# Patient Record
Sex: Female | Born: 1965 | Race: White | Hispanic: No | Smoking: Former smoker
Health system: Southern US, Community
[De-identification: ages and names within clinical notes are randomized; demographics above are authoritative.]

## PROBLEM LIST (undated history)

## (undated) DIAGNOSIS — I10 Essential (primary) hypertension: Secondary | ICD-10-CM

## (undated) DIAGNOSIS — K5792 Diverticulitis of intestine, part unspecified, without perforation or abscess without bleeding: Secondary | ICD-10-CM

## (undated) DIAGNOSIS — F200 Paranoid schizophrenia: Secondary | ICD-10-CM

## (undated) DIAGNOSIS — I809 Phlebitis and thrombophlebitis of unspecified site: Secondary | ICD-10-CM

## (undated) HISTORY — DX: Phlebitis and thrombophlebitis of unspecified site: I80.9

## (undated) HISTORY — DX: Morbid (severe) obesity due to excess calories: E66.01

---

## 2011-10-30 ENCOUNTER — Emergency Department (HOSPITAL_COMMUNITY): Payer: 59

## 2011-10-30 ENCOUNTER — Observation Stay (HOSPITAL_COMMUNITY)
Admission: EM | Admit: 2011-10-30 | Discharge: 2011-10-31 | DRG: 603 | Disposition: A | Discharge status: Home / Self Care | Payer: 59 | Attending: Internal Medicine | Admitting: Internal Medicine

## 2011-10-30 ENCOUNTER — Encounter (HOSPITAL_COMMUNITY): Payer: Self-pay | Admitting: Emergency Medicine

## 2011-10-30 DIAGNOSIS — IMO0001 Reserved for inherently not codable concepts without codable children: Secondary | ICD-10-CM | POA: Diagnosis present

## 2011-10-30 DIAGNOSIS — Z203 Contact with and (suspected) exposure to rabies: Secondary | ICD-10-CM | POA: Diagnosis present

## 2011-10-30 DIAGNOSIS — Z6838 Body mass index (BMI) 38.0-38.9, adult: Secondary | ICD-10-CM

## 2011-10-30 DIAGNOSIS — R030 Elevated blood-pressure reading, without diagnosis of hypertension: Secondary | ICD-10-CM | POA: Diagnosis present

## 2011-10-30 DIAGNOSIS — L0201 Cutaneous abscess of face: Principal | ICD-10-CM | POA: Diagnosis present

## 2011-10-30 DIAGNOSIS — L03211 Cellulitis of face: Principal | ICD-10-CM | POA: Diagnosis present

## 2011-10-30 DIAGNOSIS — S0180XA Unspecified open wound of other part of head, initial encounter: Secondary | ICD-10-CM | POA: Diagnosis present

## 2011-10-30 DIAGNOSIS — W5501XA Bitten by cat, initial encounter: Secondary | ICD-10-CM | POA: Diagnosis present

## 2011-10-30 DIAGNOSIS — D72829 Elevated white blood cell count, unspecified: Secondary | ICD-10-CM | POA: Diagnosis present

## 2011-10-30 LAB — CBC
Hemoglobin: 13.2 g/dL (ref 12.0–15.0)
MCHC: 33.3 g/dL (ref 30.0–36.0)
RBC: 4.5 MIL/uL (ref 3.87–5.11)
WBC: 12.7 10*3/uL — ABNORMAL HIGH (ref 4.0–10.5)

## 2011-10-30 LAB — DIFFERENTIAL
Basophils Relative: 0 % (ref 0–1)
Lymphocytes Relative: 19 % (ref 12–46)
Lymphs Abs: 2.4 10*3/uL (ref 0.7–4.0)
Monocytes Relative: 7 % (ref 3–12)
Neutro Abs: 9.1 10*3/uL — ABNORMAL HIGH (ref 1.7–7.7)
Neutrophils Relative %: 72 % (ref 43–77)

## 2011-10-30 IMAGING — CT CT MAXILLOFACIAL W/ CM
1 series · 15 of 30 positions shown, 19 images · IV contrast (omnipaque)
Comparison: None.

CLINICAL DATA: Animal bite (cat) to the right face in the maxillary
region below the right eye.  Green yellow drainage from the
puncture wounds.

CT MAXILLOFACIAL WITH CONTRAST
TECHNIQUE: Multidetector CT imaging of the maxillofacial
structures was performed with intravenous contrast. Multiplanar CT
image reconstructions were also generated.
Contrast: 100mL OMNIPAQUE IOHEXOL 300 MG/ML  SOLN

[Series 3: facial st · axial · 0.32mm/px · z∈[-175,-53]mm · 15 of 67 slices shown, 19 images]
[im 3/67  brain]
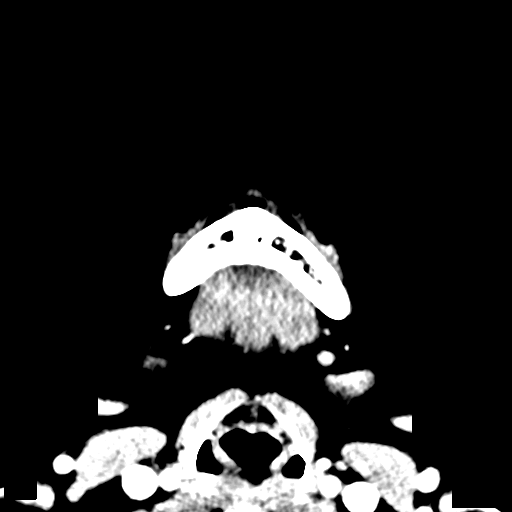
[im 3/67  bone]
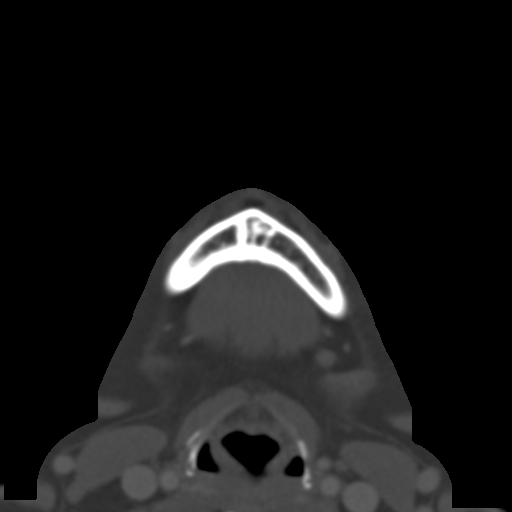
[im 7/67  bone]
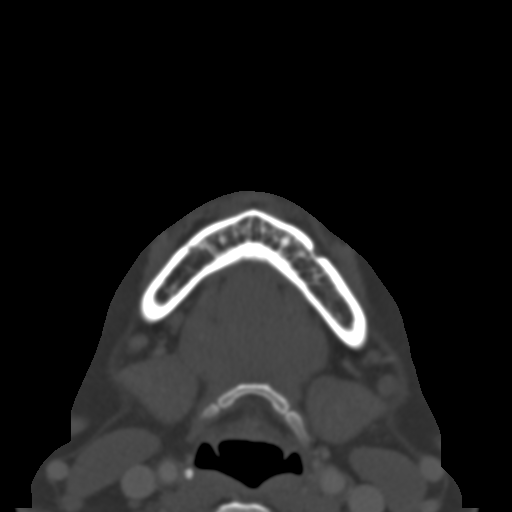
[im 12/67  bone]
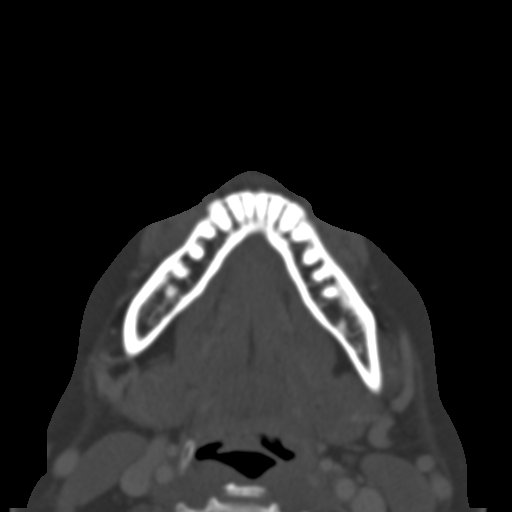
[im 16/67  bone]
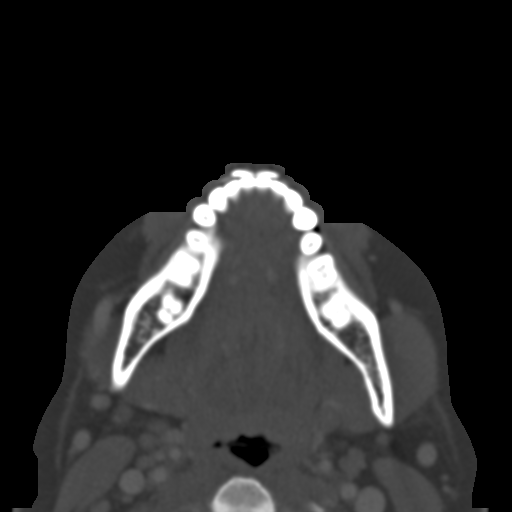
[im 21/67  brain]
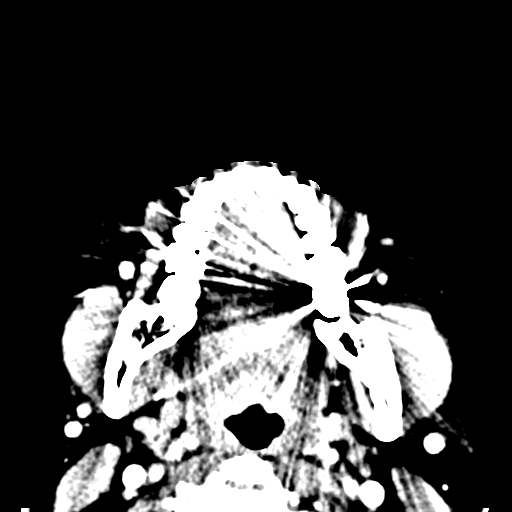
[im 21/67  bone]
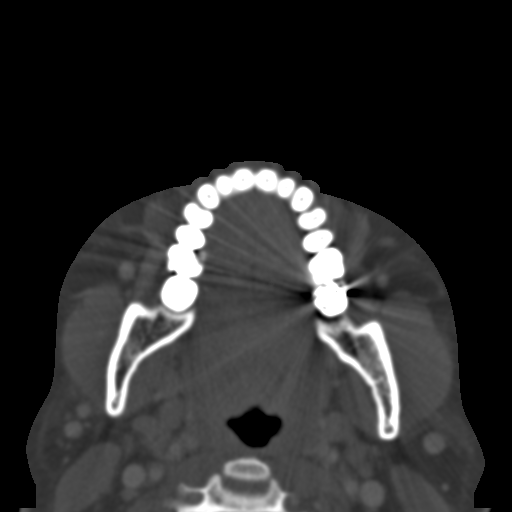
[im 26/67  bone]
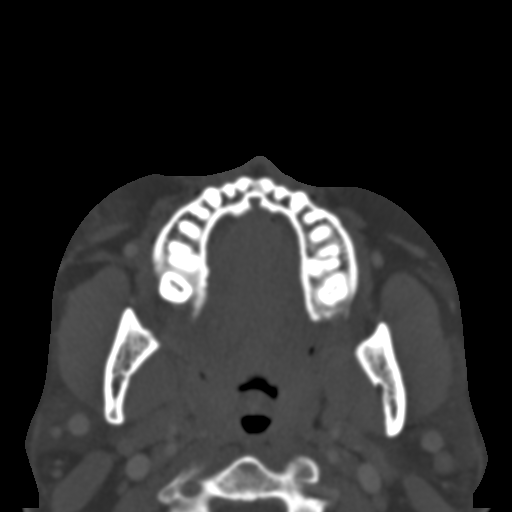
[im 30/67  bone]
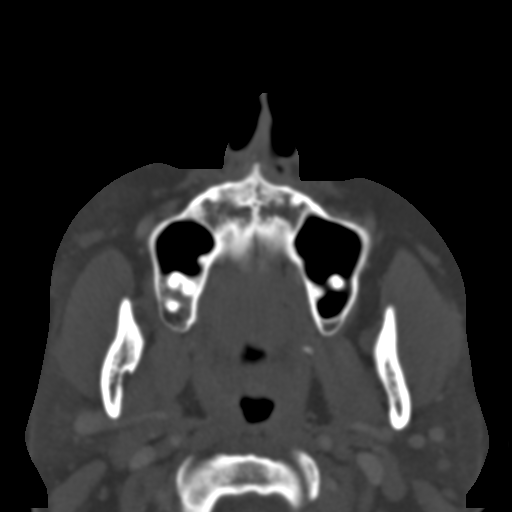
[im 35/67  bone]
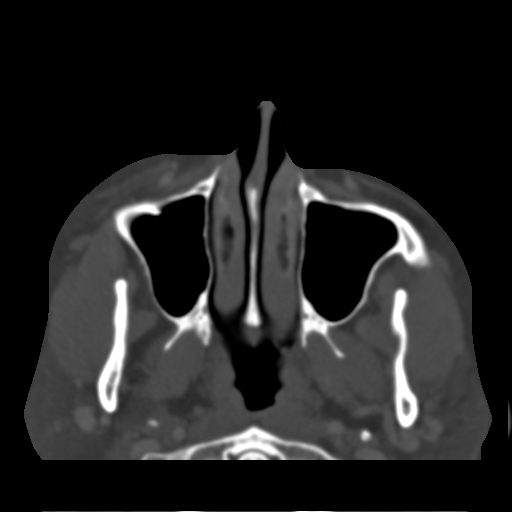
[im 37/67  brain]
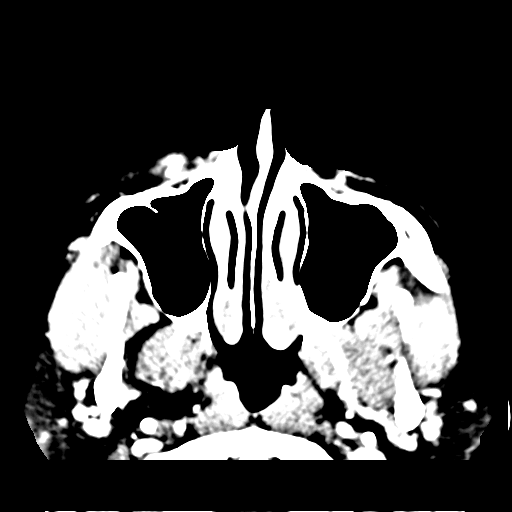
[im 37/67  bone]
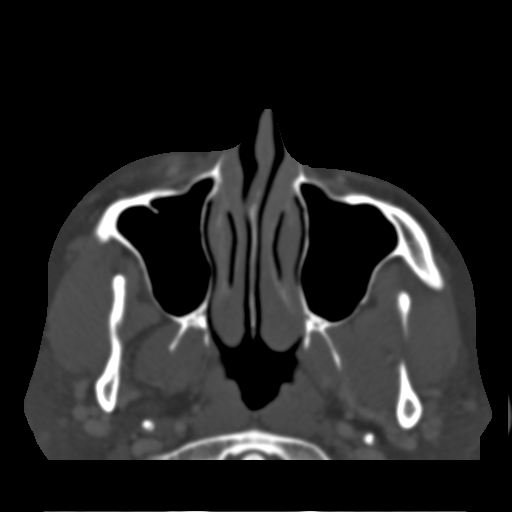
[im 41/67  bone]
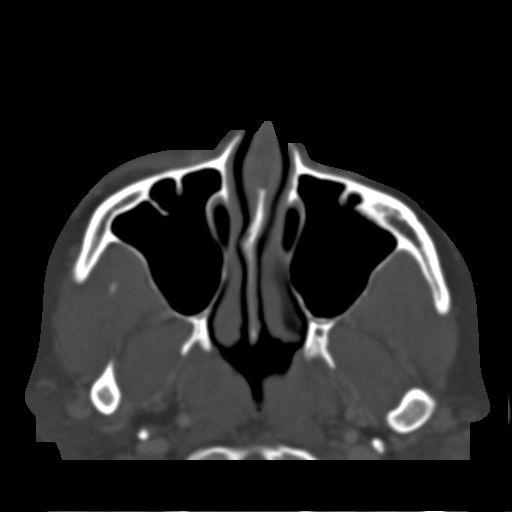
[im 46/67  bone]
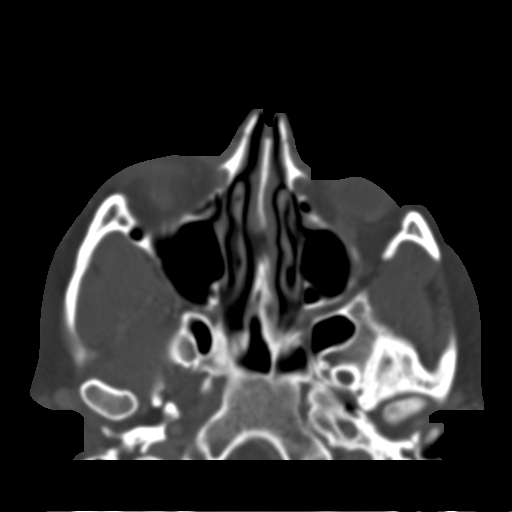
[im 51/67  bone]
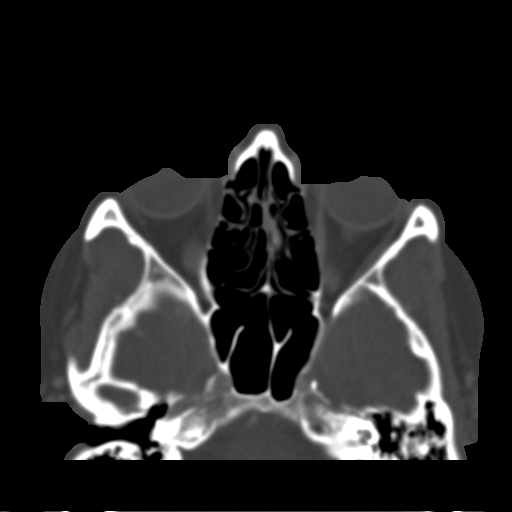
[im 55/67  brain]
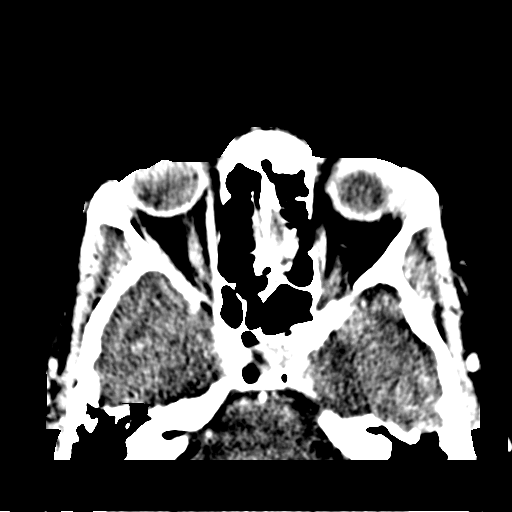
[im 55/67  bone]
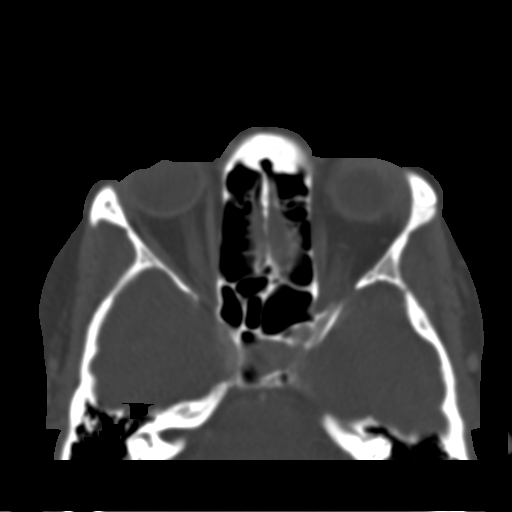
[im 60/67  bone]
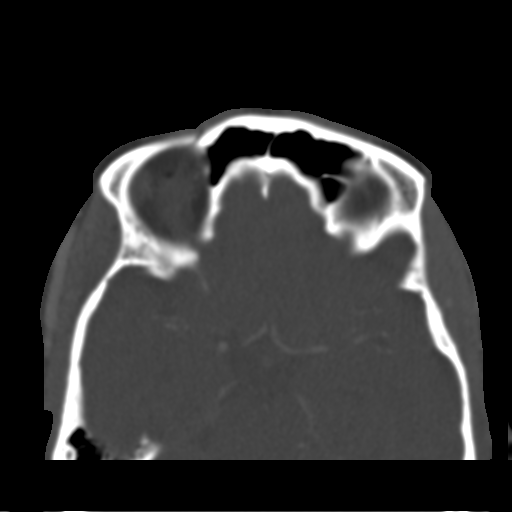
[im 64/67  bone]
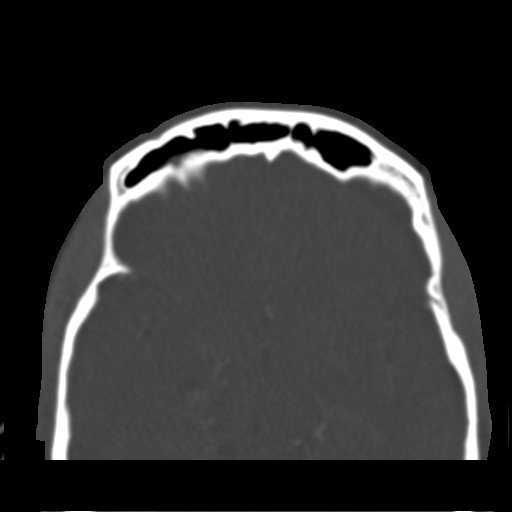

[15 of 30 positions shown; findings below may reference images not displayed]

FINDINGS: There is evidence of mild subcutaneous soft tissue
swelling and edema inferior to the right by consistent with the
area of clinical concern.  Changes are consistent with cellulitis
or edema.  No discrete abscess is identified. No abnormal focal
contrast opacification.

Small focal mucosal thickening or retention cyst in the roof of the
left maxillary antrum.  The paranasal sinuses are otherwise not
opacified.  The globes and extraocular muscles appear symmetrical
and intact.  The orbital rims, maxillary antral walls, nasal bones,
nasal septum, nasal spine, pterygoid plates, zygomatic arches,
mandibles, and temporomandibular joints appear intact.  No
displaced fractures are identified.
IMPRESSION: Mild subcutaneous soft tissue infiltration in the right anterior
maxillary region.  No discrete abscess identified.  Orbital and
facial bones appear intact.

## 2011-10-30 MED ORDER — IOHEXOL 300 MG/ML  SOLN
100.0000 mL | Freq: Once | INTRAMUSCULAR | Status: AC | PRN
  Administered 2011-10-30: 100 mL via INTRAVENOUS

## 2011-10-30 MED ORDER — SODIUM CHLORIDE 0.9 % IV SOLN
Freq: Once | INTRAVENOUS | Status: AC
  Administered 2011-10-30: 23:00:00 via INTRAVENOUS

## 2011-10-30 MED ORDER — RABIES VACCINE, PCEC IM SUSR
1.0000 mL | Freq: Once | INTRAMUSCULAR | Status: AC
  Administered 2011-10-31: 1 mL via INTRAMUSCULAR
  Filled 2011-10-30: qty 1

## 2011-10-30 MED ORDER — AMPICILLIN-SULBACTAM SODIUM 3 (2-1) G IJ SOLR
3.0000 g | Freq: Once | INTRAMUSCULAR | Status: AC
  Administered 2011-10-30: 3 g via INTRAVENOUS
  Filled 2011-10-30: qty 3

## 2011-10-30 MED ORDER — RABIES IMMUNE GLOBULIN 150 UNIT/ML IM INJ
20.0000 [IU]/kg | INJECTION | Freq: Once | INTRAMUSCULAR | Status: AC
  Administered 2011-10-31: 2100 [IU] via INTRAMUSCULAR
  Filled 2011-10-30: qty 14

## 2011-10-30 NOTE — ED Notes (Signed)
Pt has bite marks on right side of face underneath eye.  Pt states she was bitten by stray cat in her apartment complex. Right side of face is swollen with small amount of drainage coming from bite mark.

## 2011-10-30 NOTE — ED Notes (Signed)
Unable to get IV.  Notified for assist from charge RN

## 2011-10-30 NOTE — ED Notes (Signed)
MD at bedside. 

## 2011-10-30 NOTE — ED Provider Notes (Signed)
History     CSN: [id number]  Arrival date & time 10/30/11  1914   First MD Initiated Contact with Patient 10/30/11 2105      Chief Complaint  Patient presents with  . Animal Bite    (Consider location/radiation/quality/duration/timing/severity/associated sxs/prior treatment) The history is provided by the patient.   patient here with a bite to her right face 12 hours ago. Location of the bite is the right maxilla just below her eye. Denies fever but does note some green-yellow drainage from the puncture wounds. Has been expressing the pus herself. Patient does not have custody of the cat.  History reviewed. No pertinent past medical history.  Past Surgical History  Procedure Date  . Cesarean section     Family History  Problem Relation Age of Onset  . Heart failure Father     History  Substance Use Topics  . Smoking status: Never Smoker   . Smokeless tobacco: Not on file  . Alcohol Use: No    OB History    Grav Para Term Preterm Abortions TAB SAB Ect Mult Living                  Review of Systems  All other systems reviewed and are negative.    Allergies  Shellfish allergy  Home Medications   Current Outpatient Rx  Name Route Sig Dispense Refill  . OMEGA-3 FATTY ACIDS 1000 MG PO CAPS Oral Take 2 g by mouth daily.    . ADULT MULTIVITAMIN W/MINERALS CH Oral Take 1 tablet by mouth daily.      BP 175/105  Pulse 90  Temp(Src) 99.1 F (37.3 C) (Oral)  Resp 18  SpO2 96%  Physical Exam  Nursing note and vitals reviewed. Constitutional: She is oriented to person, place, and time. She appears well-developed and well-nourished.  Non-toxic appearance. No distress.  HENT:  Head: Normocephalic and atraumatic.       Laceration at infraorbital region of right face with some yellow pussy drainage. Surrounding erythema of the skin. No crepitus appreciated. Patient's extraocular muscles of her right eye are normal. Her conjunctivae are noninjected  Eyes:  Conjunctivae, EOM and lids are normal. Pupils are equal, round, and reactive to light.  Neck: Normal range of motion. Neck supple. No tracheal deviation present. No mass present.  Cardiovascular: Normal rate, regular rhythm and normal heart sounds.  Exam reveals no gallop.   No murmur heard. Pulmonary/Chest: Effort normal and breath sounds normal. No stridor. No respiratory distress. She has no decreased breath sounds. She has no wheezes. She has no rhonchi. She has no rales.  Abdominal: Soft. Normal appearance and bowel sounds are normal. She exhibits no distension. There is no tenderness. There is no rebound and no CVA tenderness.  Musculoskeletal: Normal range of motion. She exhibits no edema and no tenderness.  Neurological: She is alert and oriented to person, place, and time. She has normal strength. No cranial nerve deficit or sensory deficit. GCS eye subscore is 4. GCS verbal subscore is 5. GCS motor subscore is 6.  Skin: Skin is warm and dry. No abrasion and no rash noted.  Psychiatric: She has a normal mood and affect. Her speech is normal and behavior is normal.    ED Course  Procedures (including critical care time)  Labs Reviewed - No data to display No results found.   No diagnosis found.    MDM  Patient given rabies vaccine as well as started on Unasyn. Tetanus status was updated.  She'll be admitted to the internal medicine service.        Toy Baker, MD 10/31/11 0000

## 2011-10-31 ENCOUNTER — Encounter (HOSPITAL_COMMUNITY): Payer: Self-pay | Admitting: Internal Medicine

## 2011-10-31 DIAGNOSIS — R030 Elevated blood-pressure reading, without diagnosis of hypertension: Secondary | ICD-10-CM | POA: Diagnosis present

## 2011-10-31 DIAGNOSIS — S0185XA Open bite of other part of head, initial encounter: Secondary | ICD-10-CM | POA: Diagnosis present

## 2011-10-31 DIAGNOSIS — L0201 Cutaneous abscess of face: Secondary | ICD-10-CM

## 2011-10-31 DIAGNOSIS — L03211 Cellulitis of face: Secondary | ICD-10-CM | POA: Diagnosis present

## 2011-10-31 DIAGNOSIS — IMO0001 Reserved for inherently not codable concepts without codable children: Secondary | ICD-10-CM

## 2011-10-31 DIAGNOSIS — D72829 Elevated white blood cell count, unspecified: Secondary | ICD-10-CM | POA: Diagnosis present

## 2011-10-31 LAB — CBC
MCH: 29.1 pg (ref 26.0–34.0)
MCHC: 32.9 g/dL (ref 30.0–36.0)
Platelets: 290 10*3/uL (ref 150–400)
RBC: 4.05 MIL/uL (ref 3.87–5.11)

## 2011-10-31 LAB — BASIC METABOLIC PANEL
Calcium: 8.7 mg/dL (ref 8.4–10.5)
GFR calc non Af Amer: >90 mL/min (ref >90)
Glucose, Bld: 94 mg/dL (ref 70–99)
Sodium: 138 mEq/L (ref 135–145)

## 2011-10-31 MED ORDER — SODIUM CHLORIDE 0.9 % IV SOLN
3.0000 g | Freq: Four times a day (QID) | INTRAVENOUS | Status: DC
  Administered 2011-10-31: 3 g via INTRAVENOUS
  Filled 2011-10-31 (×2): qty 3

## 2011-10-31 MED ORDER — HYDRALAZINE HCL 20 MG/ML IJ SOLN
10.0000 mg | Freq: Four times a day (QID) | INTRAMUSCULAR | Status: DC | PRN
  Filled 2011-10-31: qty 0.5

## 2011-10-31 MED ORDER — DIPHENHYDRAMINE HCL 50 MG/ML IJ SOLN: 25.0000 mg | Freq: Four times a day (QID) | INTRAMUSCULAR | Status: DC | PRN

## 2011-10-31 MED ORDER — SODIUM CHLORIDE 0.9 % IV SOLN: 250.0000 mL | INTRAVENOUS | Status: DC | PRN

## 2011-10-31 MED ORDER — IBUPROFEN 800 MG PO TABS
800.0000 mg | ORAL_TABLET | Freq: Four times a day (QID) | ORAL | Status: DC | PRN
  Administered 2011-10-31 (×2): 800 mg via ORAL
  Filled 2011-10-31 (×2): qty 1

## 2011-10-31 MED ORDER — AMOXICILLIN-POT CLAVULANATE 875-125 MG PO TABS: 1.0000 | ORAL_TABLET | Freq: Two times a day (BID) | ORAL | Status: AC

## 2011-10-31 MED ORDER — ACETAMINOPHEN 325 MG PO TABS: 650.0000 mg | ORAL_TABLET | Freq: Four times a day (QID) | ORAL | Status: DC | PRN

## 2011-10-31 MED ORDER — TETANUS-DIPHTH-ACELL PERTUSSIS 5-2.5-18.5 LF-MCG/0.5 IM SUSP
0.5000 mL | Freq: Once | INTRAMUSCULAR | Status: AC
  Administered 2011-10-31: 0.5 mL via INTRAMUSCULAR
  Filled 2011-10-31: qty 0.5

## 2011-10-31 MED ORDER — SODIUM CHLORIDE 0.9 % IV SOLN
3.0000 g | Freq: Four times a day (QID) | INTRAVENOUS | Status: AC
  Administered 2011-10-31: 3 g via INTRAVENOUS
  Filled 2011-10-31: qty 3

## 2011-10-31 MED ORDER — SODIUM CHLORIDE 0.9 % IV SOLN: 3.0000 g | Freq: Four times a day (QID) | INTRAVENOUS | Status: DC

## 2011-10-31 MED ORDER — ACETAMINOPHEN 650 MG RE SUPP: 650.0000 mg | Freq: Four times a day (QID) | RECTAL | Status: DC | PRN

## 2011-10-31 MED ORDER — OXYCODONE HCL 5 MG PO TABS: 5.0000 mg | ORAL_TABLET | ORAL | Status: DC | PRN

## 2011-10-31 MED ORDER — SODIUM CHLORIDE 0.9 % IJ SOLN: 3.0000 mL | INTRAMUSCULAR | Status: DC | PRN

## 2011-10-31 MED ORDER — ENOXAPARIN SODIUM 40 MG/0.4ML ~~LOC~~ SOLN
40.0000 mg | SUBCUTANEOUS | Status: DC
  Filled 2011-10-31: qty 0.4

## 2011-10-31 MED ORDER — ONDANSETRON HCL 4 MG PO TABS: 4.0000 mg | ORAL_TABLET | Freq: Four times a day (QID) | ORAL | Status: DC | PRN

## 2011-10-31 MED ORDER — HYDROMORPHONE HCL PF 1 MG/ML IJ SOLN: 0.5000 mg | INTRAMUSCULAR | Status: DC | PRN

## 2011-10-31 MED ORDER — ALUM & MAG HYDROXIDE-SIMETH 200-200-20 MG/5ML PO SUSP: 30.0000 mL | Freq: Four times a day (QID) | ORAL | Status: DC | PRN

## 2011-10-31 MED ORDER — SODIUM CHLORIDE 0.9 % IJ SOLN
3.0000 mL | Freq: Two times a day (BID) | INTRAMUSCULAR | Status: DC
  Administered 2011-10-31: 3 mL via INTRAVENOUS

## 2011-10-31 MED ORDER — AMOXICILLIN-POT CLAVULANATE 875-125 MG PO TABS
1.0000 | ORAL_TABLET | Freq: Two times a day (BID) | ORAL | Status: DC
  Filled 2011-10-31 (×2): qty 1

## 2011-10-31 MED ORDER — ZOLPIDEM TARTRATE 5 MG PO TABS: 5.0000 mg | ORAL_TABLET | Freq: Every evening | ORAL | Status: DC | PRN

## 2011-10-31 MED ORDER — ONDANSETRON HCL 4 MG/2ML IJ SOLN: 4.0000 mg | Freq: Four times a day (QID) | INTRAMUSCULAR | Status: DC | PRN

## 2011-10-31 MED ORDER — OXYCODONE-ACETAMINOPHEN 5-325 MG PO TABS: 0.5000 | ORAL_TABLET | Freq: Four times a day (QID) | ORAL | Status: DC | PRN

## 2011-10-31 NOTE — ED Notes (Signed)
MD [name] at bedside. EDP explaining to pt that at this time it is noted that bite site and redness is progressing, even with does of anbx given, and that pt should stay at least one day in the hospital. Pt is concern since she does not have any arrangement for her son at this time, [facility] contacted and approved for pt's son to stay one night. Pt verbalizes understanding.

## 2011-10-31 NOTE — CV Procedure (Signed)
DC to home. No change from am assessment. Ambulated from floor to car per pt request.Britt Theard, Ricci Barker

## 2011-10-31 NOTE — ED Notes (Signed)
Attempted to call report - RN to return call.  

## 2011-10-31 NOTE — Discharge Summary (Signed)
Discharge Summary  Margaret Christian MR#: [id number]  DOB:[birth year]  Date of Admission: 10/30/2011 Date of Discharge: 10/31/2011  Patient's PCP: No primary provider on file.  Attending Physician:Kentrail Shew A  Consults: Dr. Ninetta Lights, ID by telephone.  Discharge Diagnoses: Principal Problem:  *Cellulitis and abscess of face Active Problems:  Cat bite of face  Elevated blood pressure reading without diagnosis of hypertension  Morbid obesity  Brief Admitting History and Physical Margaret Christian is an 46 y.o. female who presents to the ED on 10/29/2101 with complaints of worsening redness and swelling in face after being bitten by a ferral cat.  Discharge Medications Medication List  As of 10/31/2011 11:18 AM   TAKE these medications         amoxicillin-clavulanate 875-125 MG per tablet   Commonly known as: AUGMENTIN   Take 1 tablet by mouth every 12 (twelve) hours.      fish oil-omega-3 fatty acids 1000 MG capsule   Take 2 g by mouth daily.      mulitivitamin with minerals Tabs   Take 1 tablet by mouth daily.      oxyCODONE-acetaminophen 5-325 MG per tablet   Commonly known as: PERCOCET   Take 0.5-1 tablets by mouth every 6 (six) hours as needed for pain.            Hospital Course: Cellulitis and abscess of face from cat bite  CT of the maxillofacial done with results as indicated below.  Initially started on Unasyn, which was transitioned to Augmentin at discharge. Define a total of 12 day course of antibiotics. Patient received TDaP, rabies vaccine, and rabies immune globulin on 10/30/2011. Discussed with Dr. Ninetta Lights, infectious diseases, who agreed with transition the patient to Augmentin. Dr. Ninetta Lights noted that the patient needs to come back to the emergency department or public health at Day 3, 7, 14, 28 to complete her rabies vaccine. The corresponding days would be June 4th, 8th, 15th, 29th. Patient was notified of the above instruction, she expressed understanding and  compliance. Patient was also instructed that if her cellulitis has worsened she is to come back to the emergency department for further evaluation and for re-initiation of the IV antibiotics. Patient also reported that she will attempt to quarantine the cat if the cat comes back again.   Elevated blood pressure reading without diagnosis of hypertension  Blood pressure improved. Likely due to anxiety in the emergency department.   Morbid obesity  Diet and exercise as outpatient.  Leukocytosis Be due to cellulitis.  Improved.  Day of Discharge BP 122/81  Pulse 72  Temp(Src) 98.7 F (37.1 C) (Oral)  Resp 18  Ht 5\' 4"  (1.626 m)  Wt 102.286 kg (225 lb 8 oz)  BMI 38.71 kg/m2  SpO2 100%  LMP 10/28/2011  Results for orders placed during the hospital encounter of 10/30/11 (from the past 48 hour(s))  CBC     Status: Abnormal   Collection Time   10/30/11  9:39 PM      Component Value Range Comment   WBC 12.7 (*) 4.0 - 10.5 (K/uL)    RBC 4.50  3.87 - 5.11 (MIL/uL)    Hemoglobin 13.2  12.0 - 15.0 (g/dL)    HCT 40.9  81.1 - 91.4 (%)    MCV 88.0  78.0 - 100.0 (fL)    MCH 29.3  26.0 - 34.0 (pg)    MCHC 33.3  30.0 - 36.0 (g/dL)    RDW 78.2  95.6 - 21.3 (%)  Platelets 362  150 - 400 (K/uL)   DIFFERENTIAL     Status: Abnormal   Collection Time   10/30/11  9:39 PM      Component Value Range Comment   Neutrophils Relative 72  43 - 77 (%)    Neutro Abs 9.1 (*) 1.7 - 7.7 (K/uL)    Lymphocytes Relative 19  12 - 46 (%)    Lymphs Abs 2.4  0.7 - 4.0 (K/uL)    Monocytes Relative 7  3 - 12 (%)    Monocytes Absolute 0.8  0.1 - 1.0 (K/uL)    Eosinophils Relative 2  0 - 5 (%)    Eosinophils Absolute 0.2  0.0 - 0.7 (K/uL)    Basophils Relative 0  0 - 1 (%)    Basophils Absolute 0.1  0.0 - 0.1 (K/uL)   BASIC METABOLIC PANEL     Status: Normal   Collection Time   10/31/11  5:27 AM      Component Value Range Comment   Sodium 138  135 - 145 (mEq/L)    Potassium 3.5  3.5 - 5.1 (mEq/L)    Chloride 105   96 - 112 (mEq/L)    CO2 23  19 - 32 (mEq/L)    Glucose, Bld 94  70 - 99 (mg/dL)    BUN 8  6 - 23 (mg/dL)    Creatinine, Ser 4.78  0.50 - 1.10 (mg/dL)    Calcium 8.7  8.4 - 10.5 (mg/dL)    GFR calc non Af Amer >90  >90 (mL/min)    GFR calc Af Amer >90  >90 (mL/min)   CBC     Status: Abnormal   Collection Time   10/31/11  5:27 AM      Component Value Range Comment   WBC 10.8 (*) 4.0 - 10.5 (K/uL)    RBC 4.05  3.87 - 5.11 (MIL/uL)    Hemoglobin 11.8 (*) 12.0 - 15.0 (g/dL)    HCT 29.5 (*) 62.1 - 46.0 (%)    MCV 88.6  78.0 - 100.0 (fL)    MCH 29.1  26.0 - 34.0 (pg)    MCHC 32.9  30.0 - 36.0 (g/dL)    RDW 30.8  65.7 - 84.6 (%)    Platelets 290  150 - 400 (K/uL)     Ct Maxillofacial W/cm  10/30/2011  *RADIOLOGY REPORT*  Clinical Data: Animal bite (cat) to the right face in the maxillary region below the right eye.  Green yellow drainage from the puncture wounds.  CT MAXILLOFACIAL WITH CONTRAST  Technique:  Multidetector CT imaging of the maxillofacial structures was performed with intravenous contrast. Multiplanar CT image reconstructions were also generated.  Contrast: OMNIPAQUE IOHEXOL 300 MG/ML  SOLN  Comparison: None.  Findings: There is evidence of mild subcutaneous soft tissue swelling and edema inferior to the right by consistent with the area of clinical concern.  Changes are consistent with cellulitis or edema.  No discrete abscess is identified. No abnormal focal contrast opacification.  Small focal mucosal thickening or retention cyst in the roof of the left maxillary antrum.  The paranasal sinuses are otherwise not opacified.  The globes and extraocular muscles appear symmetrical and intact.  The orbital rims, maxillary antral walls, nasal bones, nasal septum, nasal spine, pterygoid plates, zygomatic arches, mandibles, and temporomandibular joints appear intact.  No displaced fractures are identified.  IMPRESSION: Mild subcutaneous soft tissue infiltration in the right anterior  maxillary region.  No discrete abscess identified.  Orbital and facial bones appear intact.  Original Report Authenticated By: Marlon Pel, M.D.   Disposition: Home, she was instructed to come back to the emergency department for her rabies vaccine at specific dates.  Diet: Heart healthy diet  Activity: Resume as tolerated   Follow-up Appts: Discharge Orders    Future Orders Please Complete By Expires   Diet - low sodium heart healthy      Increase activity slowly      Discharge instructions      Comments:   Please come back to emergency department or public health on Day 3 (June 4), 7 (June 8), 14 (June 15), and 28 (June 29) to complete your rabies vaccine.      TESTS THAT NEED FOLLOW-UP None  Time spent on discharge, talking to the patient, and coordinating care: 25 mins.   Signed: Cristal Ford, MD 10/31/2011, 11:18 AM

## 2011-10-31 NOTE — H&P (Addendum)
DATE OF ADMISSION:  10/31/2011  PCP:  None   Chief Complaint:  Bitten by Cat in Face   HPI: Margaret Christian is an 46 y.o. female who presents to the ED with complaints of worsening redness and swelling in face after being bitten by a ferral cat.  The cat bite occurred at 9 AM.  When she presented in the evening to the ED she was evaluated and administered rabies vaccine and tetanus shots (dtap), .and also given IV Unasyn then referred for admission.    History reviewed. No pertinent past medical history.  Past Surgical History  Procedure Date  . Cesarean section     Medications:  HOME MEDS: Prior to Admission medications   Medication Sig Start Date End Date Taking? Authorizing Provider  fish oil-omega-3 fatty acids 1000 MG capsule Take 2 g by mouth daily.   Yes Historical Provider, MD  Multiple Vitamin (MULITIVITAMIN WITH MINERALS) TABS Take 1 tablet by mouth daily.   Yes Historical Provider, MD    Allergies:  Allergies  Allergen Reactions  . Shellfish Allergy Hives    Social History:   reports that she has never smoked. She does not have any smokeless tobacco history on file. She reports that she does not drink alcohol. Her drug history not on file.  Family History: Family History  Problem Relation Age of Onset  . Heart failure Father   . Hypertension Father   . Hypertension Brother   . Diabetes type II Paternal Grandfather     Review of Systems:  The patient denies anorexia, fever, weight loss, vision loss, decreased hearing, hoarseness, chest pain, syncope, dyspnea on exertion, peripheral edema, balance deficits, hemoptysis, abdominal pain, melena, hematochezia, severe indigestion/heartburn, hematuria, incontinence, genital sores, muscle weakness, suspicious skin lesions, transient blindness, difficulty walking, depression, unusual weight change, abnormal bleeding, enlarged lymph nodes, angioedema, and breast masses.   Physical Exam:  GEN:  Pleasant 46 year old  Caucasian female examined  and in no acute distress; cooperative with exam Filed Vitals:   10/30/11 1919 10/30/11 2357  BP: 175/105   Pulse: 90   Temp: 99.1 F (37.3 C)   TempSrc: Oral   Resp: 18   Weight:  104.327 kg (230 lb)  SpO2: 96%    Blood pressure 175/105, pulse 90, temperature 99.1 F (37.3 C), temperature source Oral, resp. rate 18, weight 104.327 kg (230 lb), last menstrual period 10/28/2011, SpO2 96.00%. PSYCH: She is alert and oriented x4; does not appear anxious does not appear depressed; affect is normal HEENT: Normocephalic and + Right maxillary area with +Erythema and Edema and Rubor, Mucous membranes pink; PERRLA; EOM intact; Fundi:  Benign;  No scleral icterus, Nares: Patent, Oropharynx: Clear, Fair Dentition, Neck:  FROM, no cervical lymphadenopathy nor thyromegaly or carotid bruit; no JVD; Breasts:: Not examined CHEST WALL: No tenderness CHEST: Normal respiration, clear to auscultation bilaterally HEART: Regular rate and rhythm; no murmurs rubs or gallops BACK: No kyphosis or scoliosis; no CVA tenderness ABDOMEN: Positive Bowel Sounds, Obese, soft non-tender; no masses, no organomegaly, no pannus; no intertriginous candida. Rectal Exam: Not done EXTREMITIES: No bone or joint deformity; age-appropriate arthropathy of the hands and knees; no cyanosis, clubbing or edema; no ulcerations. Genitalia: not examined PULSES: 2+ and symmetric SKIN: Normal hydration no rash or ulceration CNS: Cranial nerves 2-12 grossly intact no focal neurologic deficit   Labs & Imaging Results for orders placed during the hospital encounter of 10/30/11 (from the past 48 hour(s))  CBC     Status:  Abnormal   Collection Time   10/30/11  9:39 PM      Component Value Range Comment   WBC 12.7 (*) 4.0 - 10.5 (K/uL)    RBC 4.50  3.87 - 5.11 (MIL/uL)    Hemoglobin 13.2  12.0 - 15.0 (g/dL)    HCT 16.1  09.6 - 04.5 (%)    MCV 88.0  78.0 - 100.0 (fL)    MCH 29.3  26.0 - 34.0 (pg)    MCHC 33.3   30.0 - 36.0 (g/dL)    RDW 40.9  81.1 - 91.4 (%)    Platelets 362  150 - 400 (K/uL)   DIFFERENTIAL     Status: Abnormal   Collection Time   10/30/11  9:39 PM      Component Value Range Comment   Neutrophils Relative 72  43 - 77 (%)    Neutro Abs 9.1 (*) 1.7 - 7.7 (K/uL)    Lymphocytes Relative 19  12 - 46 (%)    Lymphs Abs 2.4  0.7 - 4.0 (K/uL)    Monocytes Relative 7  3 - 12 (%)    Monocytes Absolute 0.8  0.1 - 1.0 (K/uL)    Eosinophils Relative 2  0 - 5 (%)    Eosinophils Absolute 0.2  0.0 - 0.7 (K/uL)    Basophils Relative 0  0 - 1 (%)    Basophils Absolute 0.1  0.0 - 0.1 (K/uL)    Ct Maxillofacial W/cm  10/30/2011  *RADIOLOGY REPORT*  Clinical Data: Animal bite (cat) to the right face in the maxillary region below the right eye.  Green yellow drainage from the puncture wounds.  CT MAXILLOFACIAL WITH CONTRAST  Technique:  Multidetector CT imaging of the maxillofacial structures was performed with intravenous contrast. Multiplanar CT image reconstructions were also generated.  Contrast: OMNIPAQUE IOHEXOL 300 MG/ML  SOLN  Comparison: None.  Findings: There is evidence of mild subcutaneous soft tissue swelling and edema inferior to the right by consistent with the area of clinical concern.  Changes are consistent with cellulitis or edema.  No discrete abscess is identified. No abnormal focal contrast opacification.  Small focal mucosal thickening or retention cyst in the roof of the left maxillary antrum.  The paranasal sinuses are otherwise not opacified.  The globes and extraocular muscles appear symmetrical and intact.  The orbital rims, maxillary antral walls, nasal bones, nasal septum, nasal spine, pterygoid plates, zygomatic arches, mandibles, and temporomandibular joints appear intact.  No displaced fractures are identified.  IMPRESSION: Mild subcutaneous soft tissue infiltration in the right anterior maxillary region.  No discrete abscess identified.  Orbital and facial bones appear  intact.  Original Report Authenticated By: Marlon Pel, M.D.      Assessment: Present on Admission:  .Cellulitis and abscess of face .Cat bite of face .Elevated blood pressure reading without diagnosis of hypertension .Morbid obesity   Plan:    Admit for 23 hour Observation IV Unasyn to cover Pasteurella  Pain Control Benadryl PRN IVFs IV Hydralazine PRN Elevated Blood pressures DVT Prophylaxis.   Other plans as per orders.    CODE STATUS:      FULL CODE         Demosthenes Virnig C 10/31/2011, 12:58 AM

## 2011-10-31 NOTE — Progress Notes (Signed)
Subjective: Feeling better.  Still having swelling below her right eye  Objective: Vital signs in last 24 hours: Filed Vitals:   10/31/11 0124 10/31/11 0401 10/31/11 0538 10/31/11 0601  BP: 158/86 142/87  122/81  Pulse: 91 79  72  Temp:  98.4 F (36.9 C)  98.7 F (37.1 C)  TempSrc:  Oral  Oral  Resp: 20 20  18   Height:   5\' 4"  (1.626 m)   Weight:  102.286 kg (225 lb 8 oz)    SpO2: 99% 98%  100%   Weight change:  No intake or output data in the 24 hours ending 10/31/11 1106  Physical Exam: General: Awake, Oriented, No acute distress. HEENT: EOMI, swelling below her right eye. Neck: Supple CV: S1 and S2 Lungs: Clear to ascultation bilaterally Abdomen: Soft, Nontender, Nondistended, +bowel sounds. Ext: Good pulses. Trace edema.  Lab Results: Basic Metabolic Panel:  Lab 10/31/11 0454  NA 138  K 3.5  CL 105  CO2 23  GLUCOSE 94  BUN 8  CREATININE 0.72  CALCIUM 8.7  MG --  PHOS --   Liver Function Tests: No results found for this basename: AST:5,ALT:5,ALKPHOS:5,BILITOT:5,PROT:5,ALBUMIN:5 in the last 168 hours No results found for this basename: LIPASE:5,AMYLASE:5 in the last 168 hours No results found for this basename: AMMONIA:5 in the last 168 hours CBC:  Lab 10/31/11 0527 10/30/11 2139  WBC 10.8* 12.7*  NEUTROABS -- 9.1*  HGB 11.8* 13.2  HCT 35.9* 39.6  MCV 88.6 88.0  PLT 290 362   Cardiac Enzymes: No results found for this basename: CKTOTAL:5,CKMB:5,CKMBINDEX:5,TROPONINI:5 in the last 168 hours BNP (last 3 results) No results found for this basename: PROBNP:3 in the last 8760 hours CBG: No results found for this basename: GLUCAP:5 in the last 168 hours No results found for this basename: HGBA1C:5 in the last 72 hours Other Labs: No components found with this basename: POCBNP:3 No results found for this basename: DDIMER:2 in the last 168 hours No results found for this basename: CHOL:2,HDL:2,LDLCALC:2,TRIG:2,CHOLHDL:2,LDLDIRECT:2 in the last 168  hours No results found for this basename: TSH,T4TOTAL,FREET3,T3FREE,FREET4,THYROIDAB in the last 168 hours No results found for this basename: VITAMINB12:2,FOLATE:2,FERRITIN:2,TIBC:2,IRON:2,RETICCTPCT:2 in the last 168 hours  Micro Results: No results found for this or any previous visit (from the past 240 hour(s)).  Studies/Results: Ct Maxillofacial W/cm  10/30/2011  *RADIOLOGY REPORT*  Clinical Data: Animal bite (cat) to the right face in the maxillary region below the right eye.  Green yellow drainage from the puncture wounds.  CT MAXILLOFACIAL WITH CONTRAST  Technique:  Multidetector CT imaging of the maxillofacial structures was performed with intravenous contrast. Multiplanar CT image reconstructions were also generated.  Contrast: OMNIPAQUE IOHEXOL 300 MG/ML  SOLN  Comparison: None.  Findings: There is evidence of mild subcutaneous soft tissue swelling and edema inferior to the right by consistent with the area of clinical concern.  Changes are consistent with cellulitis or edema.  No discrete abscess is identified. No abnormal focal contrast opacification.  Small focal mucosal thickening or retention cyst in the roof of the left maxillary antrum.  The paranasal sinuses are otherwise not opacified.  The globes and extraocular muscles appear symmetrical and intact.  The orbital rims, maxillary antral walls, nasal bones, nasal septum, nasal spine, pterygoid plates, zygomatic arches, mandibles, and temporomandibular joints appear intact.  No displaced fractures are identified.  IMPRESSION: Mild subcutaneous soft tissue infiltration in the right anterior maxillary region.  No discrete abscess identified.  Orbital and facial bones appear intact.  Original Report Authenticated By: Marlon Pel, M.D.    Medications: I have reviewed the patient's current medications. Scheduled Meds:   . sodium chloride   Intravenous Once  . amoxicillin-clavulanate  1 tablet Oral Q12H  .  ampicillin-sulbactam (UNASYN) IV  3 g Intravenous Once  . ampicillin-sulbactam (UNASYN) IV  3 g Intravenous Q6H  . enoxaparin  40 mg Subcutaneous Q24H  . rabies immune globulin  20 Units/kg Intramuscular Once  . rabies vaccine, PCEC  1 mL Intramuscular Once  . sodium chloride  3 mL Intravenous Q12H  . TDaP  0.5 mL Intramuscular Once   Continuous Infusions:  PRN Meds:.sodium chloride, acetaminophen, acetaminophen, alum & mag hydroxide-simeth, diphenhydrAMINE, hydrALAZINE, HYDROmorphone (DILAUDID) injection, ibuprofen, iohexol, ondansetron (ZOFRAN) IV, ondansetron, oxyCODONE, sodium chloride, zolpidem  Assessment/Plan: Cellulitis and abscess of face from cat bite Transition Unasyn to Augmentin.  Define a total of 12 day course of antibiotics.  Patient received TDaP, rabies vaccine, and rabies immune globulin on 10/30/2011.  Discussed with Dr. Ninetta Lights, infectious diseases, who agreed with transition the patient to Augmentin.  Dr. Ninetta Lights noted that the patient needs to come back to the emergency department or public health at Day 3, 7, 14, 28 to complete her rabies vaccine.  The corresponding days would be June 4th, 8th, 15th, 29th.  Patient was notified of the above instruction, she expressed understanding and compliance.  Patient was also instructed that if her cellulitis has worsened she is to come back to the emergency department for further evaluation and for re-initiation of the IV antibiotics.  Patient also reported that she will attempt to quarantine the cat if the cat comes back again.  Elevated blood pressure reading without diagnosis of hypertension Blood pressure improved.  Likely due to anxiety in the emergency department.  Morbid obesity Diet and exercise as outpatient.  Disposition Discharge the patient today.   LOS: 1 day  Nori Poland A, MD 10/31/2011, 11:06 AM

## 2011-11-02 ENCOUNTER — Encounter (HOSPITAL_COMMUNITY): Payer: Self-pay | Admitting: Emergency Medicine

## 2011-11-02 ENCOUNTER — Emergency Department (HOSPITAL_COMMUNITY)
Admission: EM | Admit: 2011-11-02 | Discharge: 2011-11-02 | Disposition: A | Discharge status: Home / Self Care | Payer: 59 | Attending: Emergency Medicine | Admitting: Emergency Medicine

## 2011-11-02 DIAGNOSIS — Z23 Encounter for immunization: Secondary | ICD-10-CM

## 2011-11-02 LAB — WOUND CULTURE

## 2011-11-02 MED ORDER — RABIES VACCINE, PCEC IM SUSR
1.0000 mL | Freq: Once | INTRAMUSCULAR | Status: AC
  Administered 2011-11-02: 1 mL via INTRAMUSCULAR
  Filled 2011-11-02: qty 1

## 2011-11-02 NOTE — ED Provider Notes (Signed)
History     CSN: [id number]  Arrival date & time 11/02/11  0901   First MD Initiated Contact with Patient 11/02/11 [phone]      Chief Complaint  Patient presents with  . Rabies Injection    (Consider location/radiation/quality/duration/timing/severity/associated sxs/prior treatment) HPI Patient is here for her second shot in the rabies series.  Patient states that the area on her face is healing well.  History reviewed. No pertinent past medical history.  Past Surgical History  Procedure Date  . Cesarean section     Family History  Problem Relation Age of Onset  . Heart failure Father   . Hypertension Father   . Hypertension Brother   . Diabetes type II Paternal Grandfather     History  Substance Use Topics  . Smoking status: Never Smoker   . Smokeless tobacco: Not on file  . Alcohol Use: No    OB History    Grav Para Term Preterm Abortions TAB SAB Ect Mult Living                  Review of Systems All other systems negative except as documented in the HPI. All pertinent positives and negatives as reviewed in the HPI.  Allergies  Shellfish allergy  Home Medications   Current Outpatient Rx  Name Route Sig Dispense Refill  . AMOXICILLIN-POT CLAVULANATE 875-125 MG PO TABS Oral Take 1 tablet by mouth every 12 (twelve) hours. 22 tablet 0  . OMEGA-3 FATTY ACIDS 1000 MG PO CAPS Oral Take 2 g by mouth daily.    . ADULT MULTIVITAMIN W/MINERALS CH Oral Take 1 tablet by mouth daily.      BP 174/92  Pulse 85  Temp(Src) 98.3 F (36.8 C) (Oral)  Resp 18  Wt 225 lb (102.059 kg)  SpO2 99%  LMP 10/28/2011  Physical Exam  HENT:  Head:      ED Course  Procedures (including critical care time)  Patient is stable and here for 2nd rabies shot   MDM          Carlyle Dolly, PA-C 11/02/11 [phone]

## 2011-11-02 NOTE — Discharge Instructions (Signed)
Follow up as scheduled.  

## 2011-11-02 NOTE — ED Provider Notes (Signed)
Medical screening examination/treatment/procedure(s) were performed by non-physician practitioner and as supervising physician I was immediately available for consultation/collaboration.   Dayton Bailiff, MD 11/02/11 [phone]

## 2011-11-02 NOTE — ED Notes (Signed)
Pt here for 2nd Rabies shot.  Denies any new or worsening c/o. Area on face from cat bite healing well with no signs of infection.

## 2011-11-03 ENCOUNTER — Telehealth (HOSPITAL_COMMUNITY): Payer: Self-pay | Admitting: *Deleted

## 2011-11-03 NOTE — ED Notes (Signed)
Pt. called and said she has had 2 in the rabies series- both done in ED. States that is where she was directed to go when d/c from the hospital. Was admitted overnight for antibiotics. I told her I did not have a schedule for her.  She will fax it to me.  I scheduled her appt.'s for 6/8 @ 1115 and 6/15 @ 1100. Pt. wants Day 28-5 th shot in series. I told her that is for people who are immune compromised. She said she does not know if she is. I suggested she talk to the doctor when she is here about this. She said the doctor she saw recommended she get it. Vassie Moselle 11/03/2011

## 2011-11-06 ENCOUNTER — Emergency Department (HOSPITAL_COMMUNITY)
Admission: EM | Admit: 2011-11-06 | Discharge: 2011-11-06 | Disposition: A | Discharge status: Home / Self Care | Payer: 59 | Attending: Emergency Medicine | Admitting: Emergency Medicine

## 2011-11-06 ENCOUNTER — Encounter (HOSPITAL_COMMUNITY): Payer: Self-pay | Admitting: Emergency Medicine

## 2011-11-06 DIAGNOSIS — I10 Essential (primary) hypertension: Secondary | ICD-10-CM | POA: Insufficient documentation

## 2011-11-06 DIAGNOSIS — Z23 Encounter for immunization: Secondary | ICD-10-CM | POA: Insufficient documentation

## 2011-11-06 DIAGNOSIS — L259 Unspecified contact dermatitis, unspecified cause: Secondary | ICD-10-CM | POA: Insufficient documentation

## 2011-11-06 HISTORY — DX: Essential (primary) hypertension: I10

## 2011-11-06 MED ORDER — RABIES VACCINE, PCEC IM SUSR
1.0000 mL | Freq: Once | INTRAMUSCULAR | Status: AC
  Administered 2011-11-06: 1 mL via INTRAMUSCULAR
  Filled 2011-11-06: qty 1

## 2011-11-06 MED ORDER — PREDNISONE 20 MG PO TABS: 40.0000 mg | ORAL_TABLET | Freq: Every day | ORAL | Status: DC

## 2011-11-06 NOTE — ED Notes (Signed)
Pt presented requesting her 3rd rabies shot, also c/o itching, red  rash on arms and groin area x 18 hrs

## 2011-11-06 NOTE — ED Provider Notes (Signed)
46 year old female started breaking out in a pruritic rash last night. Rashes present on her arms and in her left inguinal area. Rash is vesicular on an erythematous base in linear pattern consistent with contact dermatitis such as poison ivy dermatitis. She will be treated with oral steroids.  Dione Booze, MD 11/06/11 [phone]

## 2011-11-06 NOTE — ED Provider Notes (Signed)
History     CSN: [id number]  Arrival date & time 11/06/11  4098   First MD Initiated Contact with Patient 11/06/11 0915      Chief Complaint  Patient presents with  . Rash    red, raised, ittiching,groin and arms x 24 hrs  . Follow-up    rabies shots  . Wound Check    (Consider location/radiation/quality/duration/timing/severity/associated sxs/prior treatment) HPI Comments: Patient presents today for follow up following cat bite to right face that was treated inpatient with IV antibiotics, then d/c home with Augmentin.  Pt presenting today for third rabies vaccination in series. Pt notes she started having a pruritic rash yesterday that got much worse this morning.  Pt has rash mostly in groin area, but also on left forearm, right upper arm, around bilateral buttocks.  Denies fevers, chills.  States wound on her face is healing well, notes area of induration where it is healing.  No active drainage.  Denies any exposures to new personal care products, detergents, clothes, furniture.  No known insect exposures.  Pt was outside yesterday but is unaware of any contact with plants.  No contacts with similar rash.    Patient is a 46 y.o. female presenting with rash and wound check. The history is provided by the patient.  Rash   Wound Check     Past Medical History  Diagnosis Date  . Hypertension     Past Surgical History  Procedure Date  . Cesarean section   . Cesarean section     Family History  Problem Relation Age of Onset  . Heart failure Father   . Hypertension Father   . Hypertension Brother   . Diabetes type II Paternal Grandfather     History  Substance Use Topics  . Smoking status: Never Smoker   . Smokeless tobacco: Not on file  . Alcohol Use: No    OB History    Grav Para Term Preterm Abortions TAB SAB Ect Mult Living                  Review of Systems  Constitutional: Negative for fever and chills.  Respiratory: Negative for shortness of breath.     Cardiovascular: Negative for chest pain.  Skin: Positive for rash.    Allergies  Shellfish allergy  Home Medications   Current Outpatient Rx  Name Route Sig Dispense Refill  . AMOXICILLIN-POT CLAVULANATE 875-125 MG PO TABS Oral Take 1 tablet by mouth every 12 (twelve) hours. 22 tablet 0  . OMEGA-3 FATTY ACIDS 1000 MG PO CAPS Oral Take 2 g by mouth daily.    . ADULT MULTIVITAMIN W/MINERALS CH Oral Take 1 tablet by mouth daily.      BP 139/109  Pulse 82  Temp(Src) 98.4 F (36.9 C) (Oral)  SpO2 100%  LMP 10/28/2011  Physical Exam  Nursing note and vitals reviewed. Constitutional: She is oriented to person, place, and time. She appears well-developed and well-nourished. No distress.  HENT:  Head: Normocephalic and atraumatic.    Neck: Neck supple.  Cardiovascular: Normal rate.   Pulmonary/Chest: Effort normal and breath sounds normal.  Neurological: She is alert and oriented to person, place, and time.  Skin: Rash noted. She is not diaphoretic.       Small clusters of erythematous papules on 4 places on arms.  Left groin with multiple papules in linear arrangements.      ED Course  Procedures (including critical care time)  Labs Reviewed - No data  to display No results found.  9:44 AM Rash also examined by Dr Preston Fleeting who diagnoses contact dermatitis, recommends two weeks of 40mg  prednisone daily.   1. Need for rabies vaccination   2. Contact dermatitis   3. Hypertension       MDM  Patient with pruritic rash x 2 days.  Rash is in distinct clumps and multiple linear arrangements.  Also examined by Dr Preston Fleeting.  Likely contact dermatitis.  Pt also received 3rd rabies vaccination - no reported adverse reactions.  Pt d/c home with prednisone 40mg  PO daily x 2 weeks, return precautions.  Pt has insurance and is aware she needs to follow up for better control of her blood pressure.  Patient verbalizes understanding and agrees with plan.          Rise Patience,  [place] 11/06/11 1413

## 2011-11-06 NOTE — Discharge Instructions (Signed)
Read the information below.  Please use the medication as directed.  If the rash gets worse or you develop fevers, surrounding redness and swelling, return to the ER for a recheck.  You may return to the ER at any time for worsening condition or any new symptoms that concern you.   Contact Dermatitis Contact dermatitis is a reaction to certain substances that touch the skin. Contact dermatitis can be either irritant contact dermatitis or allergic contact dermatitis. Irritant contact dermatitis does not require previous exposure to the substance for a reaction to occur.Allergic contact dermatitis only occurs if you have been exposed to the substance before. Upon a repeat exposure, your body reacts to the substance.  CAUSES  Many substances can cause contact dermatitis. Irritant dermatitis is most commonly caused by repeated exposure to mildly irritating substances, such as:  Makeup.   Soaps.   Detergents.   Bleaches.   Acids.   Metal salts, such as nickel.  Allergic contact dermatitis is most commonly caused by exposure to:  Poisonous plants.   Chemicals (deodorants, shampoos).   Jewelry.   Latex.   Neomycin in triple antibiotic cream.   Preservatives in products, including clothing.  SYMPTOMS  The area of skin that is exposed may develop:  Dryness or flaking.   Redness.   Cracks.   Itching.   Pain or a burning sensation.   Blisters.  With allergic contact dermatitis, there may also be swelling in areas such as the eyelids, mouth, or genitals.  DIAGNOSIS  Your caregiver can usually tell what the problem is by doing a physical exam. In cases where the cause is uncertain and an allergic contact dermatitis is suspected, a patch skin test may be performed to help determine the cause of your dermatitis. TREATMENT Treatment includes protecting the skin from further contact with the irritating substance by avoiding that substance if possible. Barrier creams, powders, and  gloves may be helpful. Your caregiver may also recommend:  Steroid creams or ointments applied 2 times daily. For best results, soak the rash area in cool water for 20 minutes. Then apply the medicine. Cover the area with a plastic wrap. You can store the steroid cream in the refrigerator for a "chilly" effect on your rash. That may decrease itching. Oral steroid medicines may be needed in more severe cases.   Antibiotics or antibacterial ointments if a skin infection is present.   Antihistamine lotion or an antihistamine taken by mouth to ease itching.   Lubricants to keep moisture in your skin.   Burow's solution to reduce redness and soreness or to dry a weeping rash. Mix one packet or tablet of solution in 2 cups cool water. Dip a clean washcloth in the mixture, wring it out a bit, and put it on the affected area. Leave the cloth in place for 30 minutes. Do this as often as possible throughout the day.   Taking several cornstarch or baking soda baths daily if the area is too large to cover with a washcloth.  Harsh chemicals, such as alkalis or acids, can cause skin damage that is like a burn. You should flush your skin for 15 to 20 minutes with cold water after such an exposure. You should also seek immediate medical care after exposure. Bandages (dressings), antibiotics, and pain medicine may be needed for severely irritated skin.  HOME CARE INSTRUCTIONS  Avoid the substance that caused your reaction.   Keep the area of skin that is affected away from hot water, soap,  sunlight, chemicals, acidic substances, or anything else that would irritate your skin.   Do not scratch the rash. Scratching may cause the rash to become infected.   You may take cool baths to help stop the itching.   Only take over-the-counter or prescription medicines as directed by your caregiver.   See your caregiver for follow-up care as directed to make sure your skin is healing properly.  SEEK MEDICAL CARE IF:     Your condition is not better after 3 days of treatment.   You seem to be getting worse.   You see signs of infection such as swelling, tenderness, redness, soreness, or warmth in the affected area.   You have any problems related to your medicines.  Document Released: 05/14/2000 Document Revised: 05/06/2011 Document Reviewed: 10/20/2010 [facility] Patient Information 2012 [place], [place].  Hypertension Information As your heart beats, it forces blood through your arteries. This force is your blood pressure. If the pressure is too high, it is called hypertension (HTN) or high blood pressure. HTN is dangerous because you may have it and not know it. High blood pressure may mean that your heart has to work harder to pump blood. Your arteries may be narrow or stiff. The extra work puts you at risk for heart disease, stroke, and other problems.  Blood pressure consists of two numbers, a higher number over a lower, 110/72, for example. It is stated as "110 over 72." The ideal is below 120 for the top number (systolic) and under 80 for the bottom (diastolic).  You should pay close attention to your blood pressure if you have certain conditions such as:  Heart failure.   Prior heart attack.   Diabetes   Chronic kidney disease.   Prior stroke.   Multiple risk factors for heart disease.  To see if you have HTN, your blood pressure should be measured while you are seated with your arm held at the level of the heart. It should be measured at least twice. A one-time elevated blood pressure reading (especially in the Emergency Department) does not mean that you need treatment. There may be conditions in which the blood pressure is different between your right and left arms. It is important to see your caregiver soon for a recheck. Most people have essential hypertension which means that there is not a specific cause. This type of high blood pressure may be lowered by changing lifestyle factors such  as:  Stress.   Smoking.   Lack of exercise.   Excessive weight.   Drug/tobacco/alcohol use.   Eating less salt.  Most people do not have symptoms from high blood pressure until it has caused damage to the body. Effective treatment can often prevent, delay or reduce that damage. TREATMENT  Treatment for high blood pressure, when a cause has been identified, is directed at the cause. There are a large number of medications to treat HTN. These fall into several categories, and your caregiver will help you select the medicines that are best for you. Medications may have side effects. You should review side effects with your caregiver. If your blood pressure stays high after you have made lifestyle changes or started on medicines,   Your medication(s) may need to be changed.   Other problems may need to be addressed.   Be certain you understand your prescriptions, and know how and when to take your medicine.   Be sure to follow up with your caregiver within the time frame advised (usually within two  weeks) to have your blood pressure rechecked and to review your medications.   If you are taking more than one medicine to lower your blood pressure, make sure you know how and at what times they should be taken. Taking two medicines at the same time can result in blood pressure that is too low.  Document Released: 07/20/2005 Document Revised: 01/27/2011 Document Reviewed: 07/27/2007 [facility] Patient Information 2012 [place], [place].

## 2011-11-07 NOTE — ED Provider Notes (Signed)
Medical screening examination/treatment/procedure(s) were conducted as a shared visit with non-physician practitioner(s) and myself.  I personally evaluated the patient during the encounter   Dione Booze, MD 11/07/11 1521

## 2011-11-13 ENCOUNTER — Encounter (HOSPITAL_COMMUNITY): Payer: Self-pay | Admitting: Emergency Medicine

## 2011-11-13 ENCOUNTER — Emergency Department (HOSPITAL_COMMUNITY)
Admission: EM | Admit: 2011-11-13 | Discharge: 2011-11-13 | Disposition: A | Discharge status: Home / Self Care | Payer: 59 | Attending: Emergency Medicine | Admitting: Emergency Medicine

## 2011-11-13 DIAGNOSIS — S0185XA Open bite of other part of head, initial encounter: Secondary | ICD-10-CM

## 2011-11-13 DIAGNOSIS — Z23 Encounter for immunization: Secondary | ICD-10-CM | POA: Insufficient documentation

## 2011-11-13 DIAGNOSIS — I10 Essential (primary) hypertension: Secondary | ICD-10-CM | POA: Insufficient documentation

## 2011-11-13 MED ORDER — RABIES VACCINE, PCEC IM SUSR
1.0000 mL | Freq: Once | INTRAMUSCULAR | Status: AC
  Administered 2011-11-13: 1 mL via INTRAMUSCULAR
  Filled 2011-11-13: qty 1

## 2011-11-13 NOTE — ED Provider Notes (Signed)
History     CSN: [id number]  Arrival date & time 11/13/11  1512   First MD Initiated Contact with Patient 11/13/11 1543      No chief complaint on file.   (Consider location/radiation/quality/duration/timing/severity/associated sxs/prior treatment) The history is provided by the patient. No language interpreter was used.   Cc:  Patient is here today to receive her day 14 rabies vaccine (rabavert).  This will be her last vaccine in a series of 4.   No complaints.  Initial bite to the face unremarkable.   Past Medical History  Diagnosis Date  . Hypertension     Past Surgical History  Procedure Date  . Cesarean section   . Cesarean section     Family History  Problem Relation Age of Onset  . Heart failure Father   . Hypertension Father   . Hypertension Brother   . Diabetes type II Paternal Grandfather     History  Substance Use Topics  . Smoking status: Never Smoker   . Smokeless tobacco: Not on file  . Alcohol Use: No    OB History    Grav Para Term Preterm Abortions TAB SAB Ect Mult Living                  Review of Systems  Constitutional: Negative.   HENT: Negative.   Eyes: Negative.   Respiratory: Negative.   Cardiovascular: Negative.   Gastrointestinal: Negative.   Neurological: Negative.   Psychiatric/Behavioral: Negative.   All other systems reviewed and are negative.    Allergies  Shellfish allergy  Home Medications   Current Outpatient Rx  Name Route Sig Dispense Refill  . OMEGA-3 FATTY ACIDS 1000 MG PO CAPS Oral Take 2 g by mouth daily.    . ADULT MULTIVITAMIN W/MINERALS CH Oral Take 1 tablet by mouth daily.    Marland Kitchen POISON IVY SCRUB EX Apply externally Apply 1 application topically 3 (three) times daily as needed. Itching and irritation      BP 156/93  Pulse 94  Temp 98.6 F (37 C) (Oral)  Resp 20  SpO2 100%  LMP 10/28/2011  Physical Exam  Nursing note and vitals reviewed. Constitutional: She is oriented to person, place, and  time. She appears well-developed and well-nourished.  HENT:  Head: Normocephalic and atraumatic.  Eyes: Conjunctivae and EOM are normal. Pupils are equal, round, and reactive to light.  Neck: Normal range of motion. Neck supple.  Cardiovascular: Normal rate.   Pulmonary/Chest: Effort normal.  Abdominal: Soft.  Musculoskeletal: Normal range of motion. She exhibits no edema and no tenderness.  Neurological: She is alert and oriented to person, place, and time. She has normal reflexes.  Skin: Skin is warm and dry.  Psychiatric: She has a normal mood and affect.    ED Course  Procedures (including critical care time)  Labs Reviewed - No data to display No results found.   No diagnosis found.    MDM  4th in series of rabies vaccination .  Done with series today.  Return if any concerns.          Remi Haggard, NP 11/13/11 1640

## 2011-11-13 NOTE — ED Provider Notes (Signed)
Medical screening examination/treatment/procedure(s) were performed by non-physician practitioner and as supervising physician I was immediately available for consultation/collaboration.  Toy Baker, MD 11/13/11 [phone]

## 2011-11-13 NOTE — ED Notes (Signed)
Pt needing 3rd rabies vaccine

## 2011-11-13 NOTE — ED Notes (Signed)
Pt requesting rabies vaccine.  

## 2011-11-13 NOTE — Discharge Instructions (Signed)
[name] you are done with the series of rabies vaccines today congratulations!! The pharmacist verified this.  Margaret Anderson rn also told you it is not necessary.  Return to the ER if you have further concerns or drainage from the site.   RESOURCE GUIDE  Chronic Pain Problems: Contact [facility]  [phone] Patients need to be referred by their primary care doctor.  Insufficient Money for Medicine: Contact United Way:  call "211" or [facility] [phone].  No Primary Care Doctor: - Call [facility]  [phone] - can help you locate a primary care doctor that  accepts your insurance, provides certain services, etc. - Physician Referral Service- [phone]  Agencies that provide inexpensive medical care: - [facility]  [phone] - [facility]  [phone] - [facility]  [phone] - Women's Clinic  [phone] - Planned Parenthood  [phone] [facility]  [phone]  Medicaid-accepting [facility] Providers: - [facility]- [address]  [phone], Mon-Fri 9am-7pm, Sat 9am-1pm - [facility]- [address] [place], Suite [place]  [phone] - [facility] [place]- [address], [address] [place]  [phone] [facility]- [address]  [phone] - Renaye Rakers- [address] [place], [address], [phone]  Only accepts [place] patients after they have their name  applied to their card  Self Pay (no insurance) in [place]: - Sickle Cell Patients: Dr Willey Blade, [facility]  [address] [place], [phone] - [facility]- [address] [place]  [phone]       [facility] [place] [facility]- [address]       -     [facility]- see information above (Speak to [employer] if you do not have insurance)       -  [facility]- [address] [place],  [phone]       -  [facility]- [address],  [phone]       -  [facility]- [address] [phone]       -  Dr Julio Sicks-  [address], [place], [phone]       -  [facility]- [address], [phone]       -  [facility]- [address] [phone], also [address] [phone]       -    [facility]- [address] [place], [phone], 1st & 3rd Saturday   every month, 10am-1pm  1) Find a Doctor and Pay Out of Pocket Although you won't have to find out who is covered by your insurance plan, it is a good idea to ask around and get recommendations. You will then need to call the office and see if the doctor you have chosen will accept you as a new patient and what types of options they offer for patients who are self-pay. Some doctors offer discounts or will set up payment plans for their patients who do not have insurance, but you will need to ask so you aren't surprised when you get to your appointment.  2) Contact Your Local Health Department Not all health departments have doctors that can see patients for sick visits, but many do, so it is  worth a call to see if yours does. If you don't know where your local health department is, you can check in your phone book. The CDC also has a tool to help you locate your state's health department, and many state websites also have listings of all of their local health departments.  3) Find a Walk-in Clinic If your illness is not likely to be very severe or complicated, you may want to try a walk in clinic. These are popping up all over the country in pharmacies, drugstores, and shopping centers. They're usually staffed by nurse practitioners or physician assistants that have been trained to treat common illnesses and complaints. They're usually fairly quick and inexpensive. However, if you have serious medical issues or chronic medical problems, these are  probably not your best option  STD Testing - [facility] Department of [facility], STD Clinic, [address], [place], phone [phone] or [phone].  Monday - Friday, call for an appointment. [facility] of [employer], STD Clinic, [address], [place], phone [phone] or [phone].  Monday - Friday, call for an appointment.  Abuse/Neglect: [facility] Child Abuse Hotline 281-225-8276 [facility] Child Abuse Hotline (810) 679-6313 (After Hours)  Emergency Shelter:  [facility] 838-577-6847  Maternity Homes: - [facility] (959)019-4577 - [facility] (559) 379-3066  MRSA Hotline #:   347 640 1437  [facility] Resources  [facility] [place] [facility]  [employer] [facility] Dept. [address]                 [address]         [address] [place] [address]  Blondell Reveal Phone:  [phone]                                  Phone:  [phone]                   Phone:  [phone]  [facility], [phone] - [facility] - [phone]       -     [facility] in [place], [address],                                  [phone], [facility] Child Abuse Hotline 979-403-1116 or (276)514-0367 (After Hours)   Behavioral Health Services  Substance Abuse Resources: - [facility]  [phone] - [facility] (351) [phone] - [facility] [phone] Floydene Flock [phone] - Residential & Outpatient Substance Abuse Program  (419) [phone]  Psychological Services: [facility]  [phone] Services  ([phone] - [facility], [phone] [place]. [address], [place], ACCESS LINE:  (571) [phone] or (905)814-0552, [employer]  Dental Assistance  If unable to pay or uninsured, contact:  [facility] or [facility]. to become qualified for the adult dental clinic.  Patients with Medicaid: [facility] [phone] [address], [phone] [address], [phone]  If unable to pay, or uninsured, contact [facility] (707)511-4893) or [facility] Department [phone] in [place], [phone] in [facility]) to become qualified for the adult dental clinic  Other Low-Cost Community Dental Services: - [facility]- [address] [place], [place], [place], [address], [phone], Ext. [phone], 2nd and 4th Thursday of the month at 6:30am.  10 clients each day by appointment, can sometimes see walk-in patients if someone does not show for an appointment. [facility]- [address] [place], [place], [address], [phone] - [facility]- [address], [place], [place], [address], [phone] - [facility]- [phone] [facility]- [phone] [facility] Department- [phone]

## 2012-11-13 ENCOUNTER — Ambulatory Visit: Payer: Self-pay | Admitting: Internal Medicine

## 2012-11-23 ENCOUNTER — Ambulatory Visit (INDEPENDENT_AMBULATORY_CARE_PROVIDER_SITE_OTHER): Payer: BC Managed Care – PPO | Admitting: Internal Medicine

## 2012-11-23 ENCOUNTER — Encounter: Payer: Self-pay | Admitting: Internal Medicine

## 2012-11-23 VITALS — BP 142/100 | HR 76 | Temp 97.9°F | Ht 64.0 in | Wt 229.0 lb

## 2012-11-23 DIAGNOSIS — R200 Anesthesia of skin: Secondary | ICD-10-CM | POA: Insufficient documentation

## 2012-11-23 DIAGNOSIS — R030 Elevated blood-pressure reading, without diagnosis of hypertension: Secondary | ICD-10-CM

## 2012-11-23 DIAGNOSIS — I10 Essential (primary) hypertension: Secondary | ICD-10-CM

## 2012-11-23 DIAGNOSIS — Z0000 Encounter for general adult medical examination without abnormal findings: Secondary | ICD-10-CM

## 2012-11-23 DIAGNOSIS — R202 Paresthesia of skin: Secondary | ICD-10-CM

## 2012-11-23 DIAGNOSIS — E669 Obesity, unspecified: Secondary | ICD-10-CM

## 2012-11-23 DIAGNOSIS — R209 Unspecified disturbances of skin sensation: Secondary | ICD-10-CM
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
## 2012-11-23 NOTE — Assessment & Plan Note (Signed)
Patient has intermittent numbness and tingling of both hands (left greater than right). 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  If symptoms worsen, refer to orthopedic specialist.

## 2012-11-23 NOTE — Assessment & Plan Note (Signed)
Reviewed adult health maintenance protocols.  Weight loss encouraged. Screen for type diabetes. Refer to local gynecologist for Pap and pelvic. Arrange screening mammogram.  Reviewed recent bloodwork.

## 2012-11-23 NOTE — Assessment & Plan Note (Signed)
Patient advised to monitor blood pressure at home. Dashdiet and regular exercise recommended.  Reassess in 4 weeks.

## 2012-11-24 ENCOUNTER — Encounter: Payer: Self-pay | Admitting: Internal Medicine

## 2012-11-24 ENCOUNTER — Other Ambulatory Visit (INDEPENDENT_AMBULATORY_CARE_PROVIDER_SITE_OTHER): Payer: BC Managed Care – PPO

## 2012-11-24 DIAGNOSIS — E669 Obesity, unspecified: Secondary | ICD-10-CM

## 2012-11-24 DIAGNOSIS — I10 Essential (primary) hypertension: Secondary | ICD-10-CM

## 2012-11-24 LAB — LIPID PANEL
Cholesterol: 228 mg/dL — ABNORMAL HIGH (ref 0–200)
Triglycerides: 170 mg/dL — ABNORMAL HIGH (ref 0.0–149.0)

## 2012-11-24 LAB — LDL CHOLESTEROL, DIRECT: Direct LDL: 157.2 mg/dL

## 2012-12-15 ENCOUNTER — Inpatient Hospital Stay: Admission: RE | Admit: 2012-12-15 | Payer: BC Managed Care – PPO | Source: Ambulatory Visit

## 2012-12-21 ENCOUNTER — Ambulatory Visit: Payer: BC Managed Care – PPO | Admitting: Internal Medicine

## 2013-01-19 ENCOUNTER — Inpatient Hospital Stay: Admission: RE | Admit: 2013-01-19 | Payer: BC Managed Care – PPO | Source: Ambulatory Visit

## 2013-01-19 ENCOUNTER — Ambulatory Visit: Payer: BC Managed Care – PPO | Admitting: Internal Medicine

## 2013-01-30 ENCOUNTER — Ambulatory Visit (INDEPENDENT_AMBULATORY_CARE_PROVIDER_SITE_OTHER): Payer: BC Managed Care – PPO | Admitting: Internal Medicine

## 2013-01-30 ENCOUNTER — Encounter: Payer: Self-pay | Admitting: Internal Medicine

## 2013-01-30 VITALS — BP 142/94 | HR 87 | Temp 99.3°F | Wt 231.0 lb

## 2013-01-30 DIAGNOSIS — R7303 Prediabetes: Secondary | ICD-10-CM

## 2013-01-30 DIAGNOSIS — R7309 Other abnormal glucose: Secondary | ICD-10-CM

## 2013-01-30 DIAGNOSIS — J069 Acute upper respiratory infection, unspecified: Secondary | ICD-10-CM

## 2013-01-30 DIAGNOSIS — R030 Elevated blood-pressure reading, without diagnosis of hypertension: Secondary | ICD-10-CM

## 2013-01-30 NOTE — Assessment & Plan Note (Signed)
Patient has signs and symptoms of viral upper respiratory infection. Her chest exam is clear. I recommended symptomatic treatment.  Patient advised to call office if symptoms persist or worsen.
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
## 2013-01-30 NOTE — Assessment & Plan Note (Signed)
Monitor A1c.  Stressed importance of low carbohydrate diet and regular exercise.

## 2013-01-30 NOTE — Patient Instructions (Addendum)
Limit your sodium intake to 2.5 to 3 grams per day.  Increase your intake of fresh fruits and vegetables. Aerobic exercise 4-5 times per week Please contact our office if your cough does not improve or gets worse. Please complete the following lab tests before your next follow up appointment: BMET - 401.9 A1c - 790.29 High sensitivity CRP - 790.29

## 2013-01-30 NOTE — Assessment & Plan Note (Signed)
We discussed options for antihypertensives. We discussed side effects of different medication at length. Patient would like to try changes in her diet and exercise for next 3 months. Patient agrees to continue to monitor her blood pressure at home. She will work towards weight loss. BP: 142/94 mmHg

## 2013-02-02 ENCOUNTER — Other Ambulatory Visit: Payer: Self-pay | Admitting: Internal Medicine

## 2013-02-02 ENCOUNTER — Ambulatory Visit
Admission: RE | Admit: 2013-02-02 | Discharge: 2013-02-02 | Disposition: A | Discharge status: Home / Self Care | Payer: BC Managed Care – PPO | Source: Ambulatory Visit | Attending: Internal Medicine | Admitting: Internal Medicine

## 2013-02-02 ENCOUNTER — Ambulatory Visit: Admission: RE | Admit: 2013-02-02 | Payer: BC Managed Care – PPO | Source: Ambulatory Visit

## 2013-02-02 DIAGNOSIS — Z1231 Encounter for screening mammogram for malignant neoplasm of breast: Secondary | ICD-10-CM

## 2013-02-02 IMAGING — MG MM SCREEN MAMMOGRAM BILATERAL
4 series · 4 of 4 positions shown · non-contrast
Comparison: None.

CLINICAL DATA: Screening.

DIGITAL SCREENING BILATERAL MAMMOGRAM WITH CAD

[R CC]
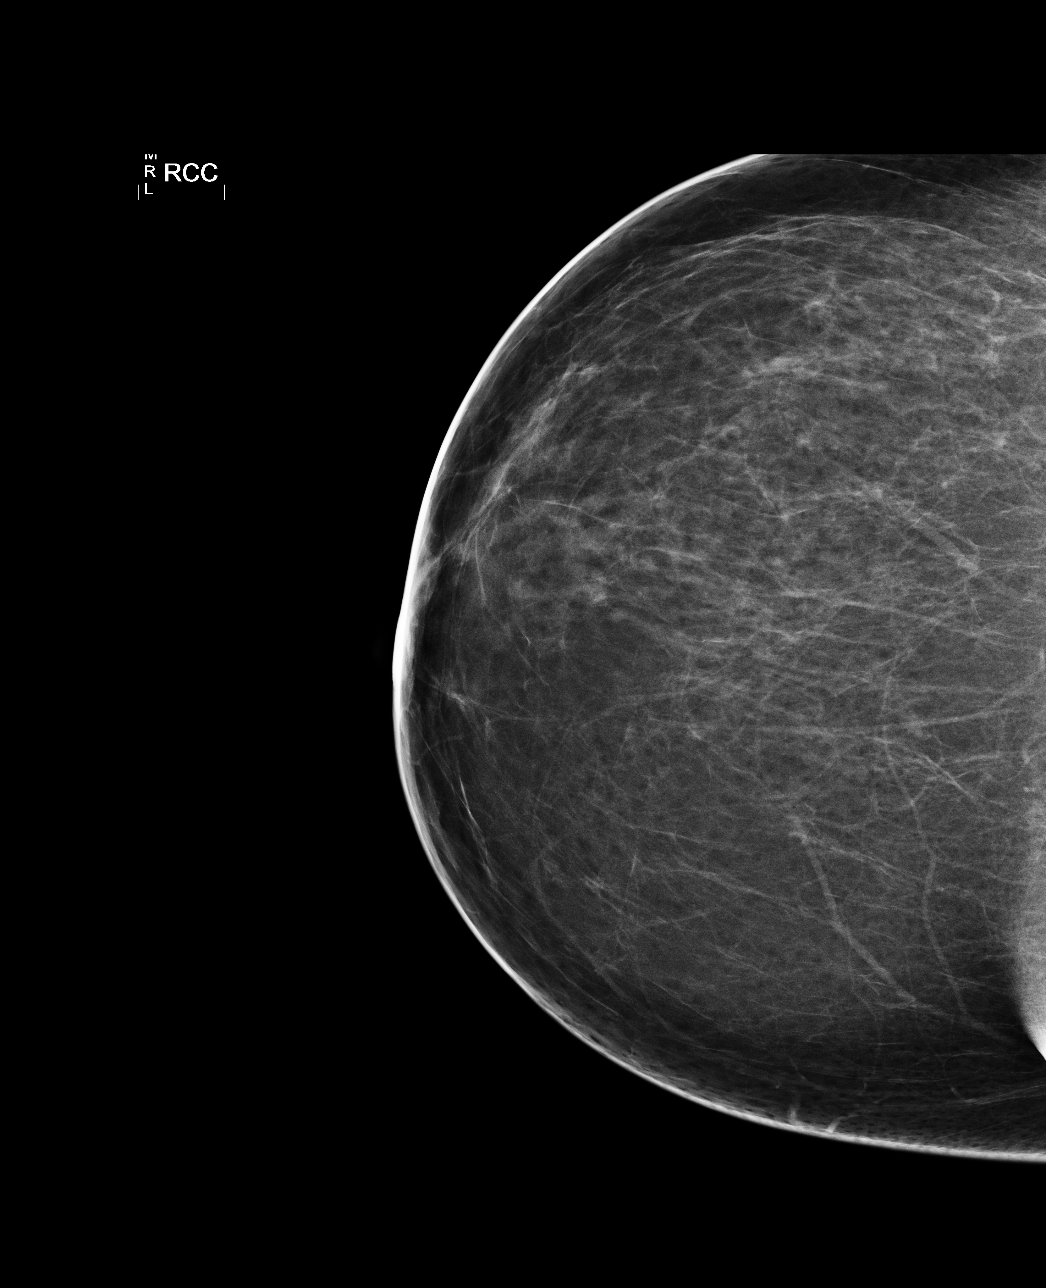

[L CC]
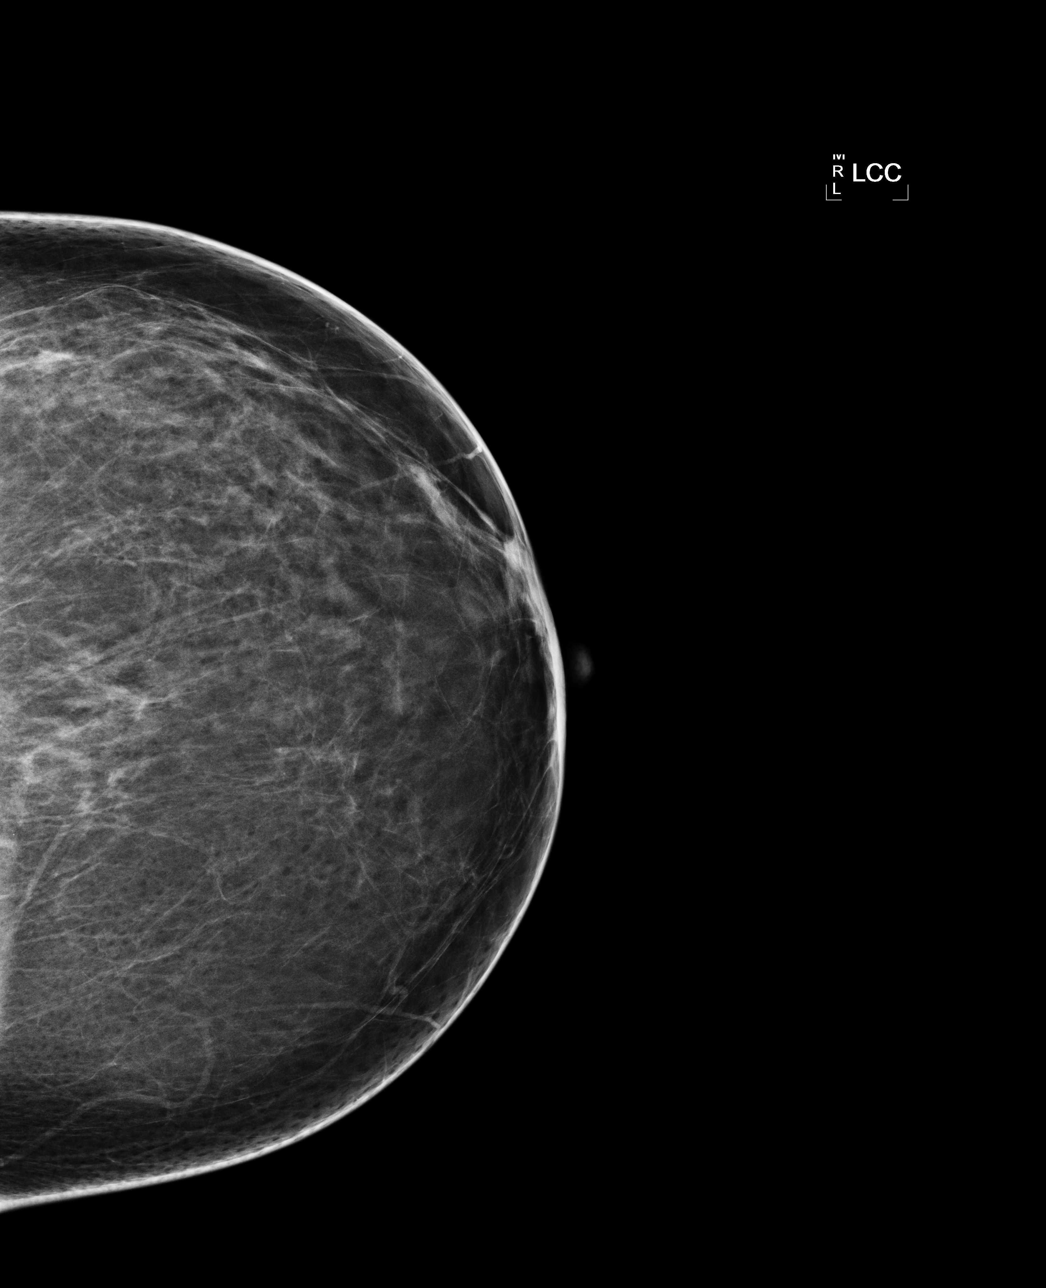

[L MLO]
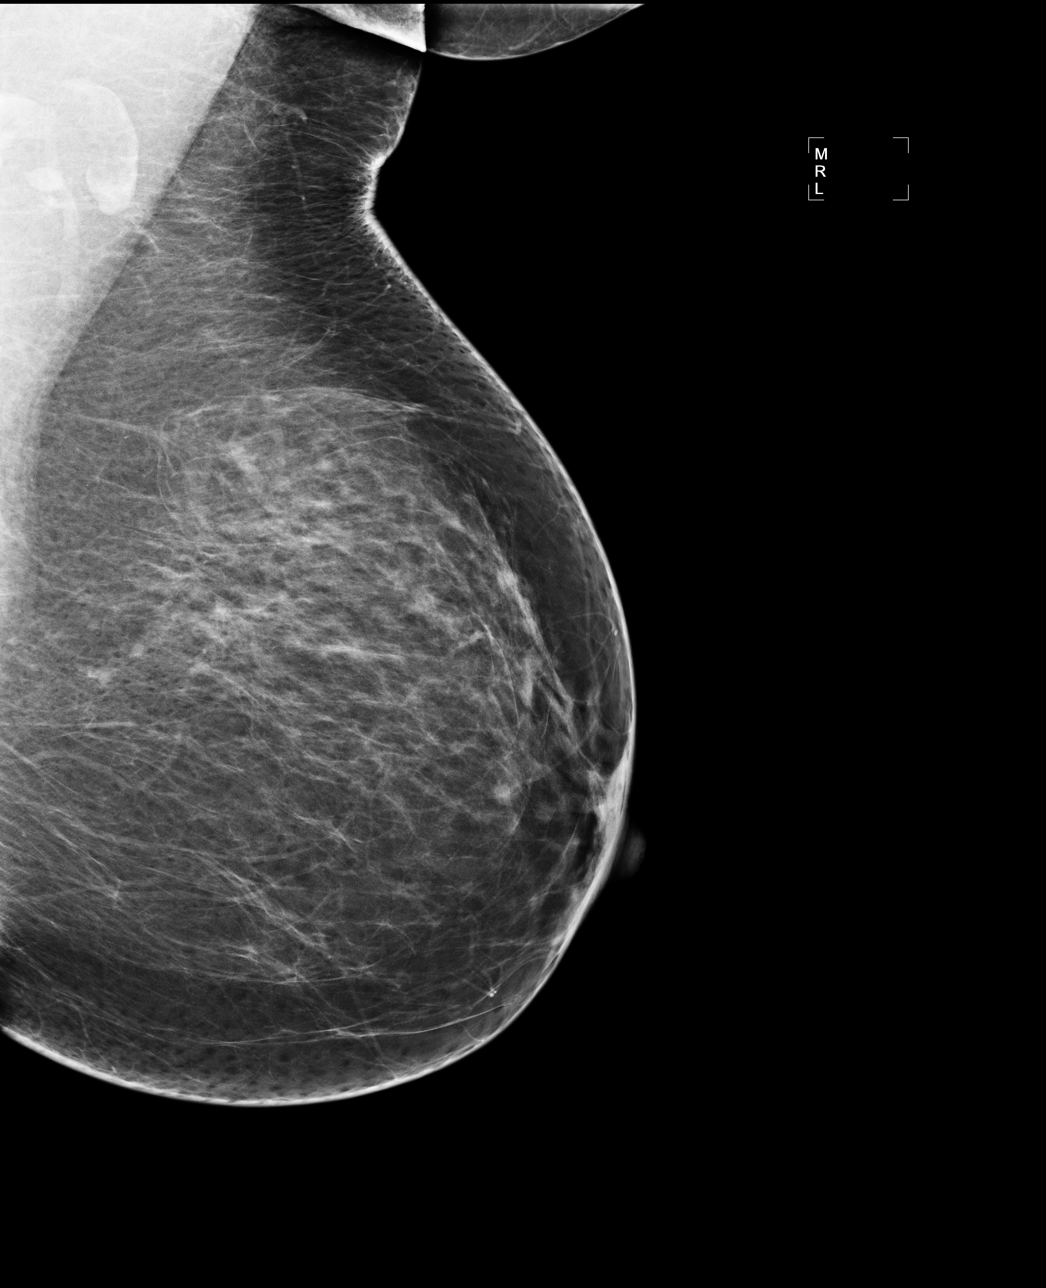

[R MLO]
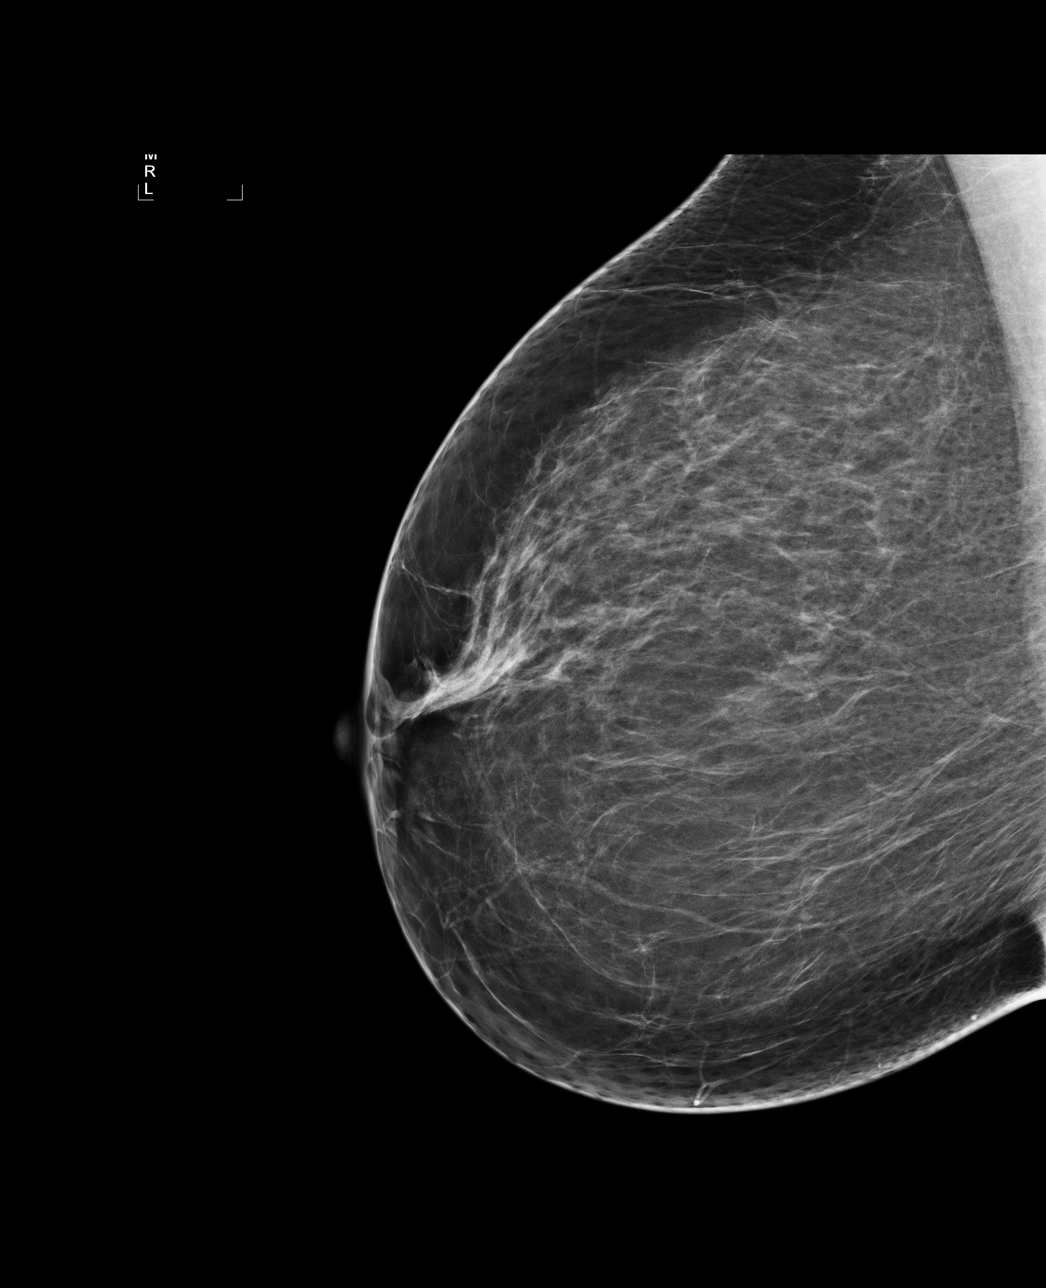

[4 of 4 positions shown; findings below may reference images not displayed]

FINDINGS: ACR Breast Density Category b:  There are scattered areas of
fibroglandular density.

There are no findings suspicious for malignancy.

Images were processed with CAD.
IMPRESSION: No mammographic evidence of malignancy.

A result letter of this screening mammogram will be mailed directly
to the patient.

RECOMMENDATION:
Screening mammogram in one year. (Code:[XC])

BI-RADS CATEGORY 1:  Negative.

## 2013-04-25 ENCOUNTER — Other Ambulatory Visit: Payer: BC Managed Care – PPO

## 2013-05-01 ENCOUNTER — Ambulatory Visit: Payer: BC Managed Care – PPO | Admitting: Internal Medicine

## 2013-11-25 IMAGING — US US ABDOMEN COMPLETE
1 series · 14 of 25 positions shown · non-contrast
Comparison: None.

CLINICAL DATA: 49-year-old female with left upper quadrant
abdominal pain, chronic. Initial encounter.

EXAM:
ABDOMEN ULTRASOUND COMPLETE

[Series 1: us abdomen complete · 0.15mm/px · 14 of 153 slices shown]
[im 1/153]
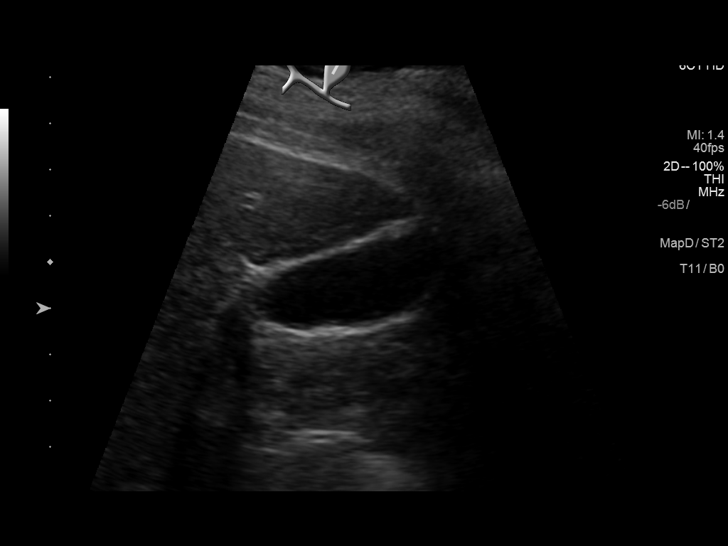
[im 13/153]
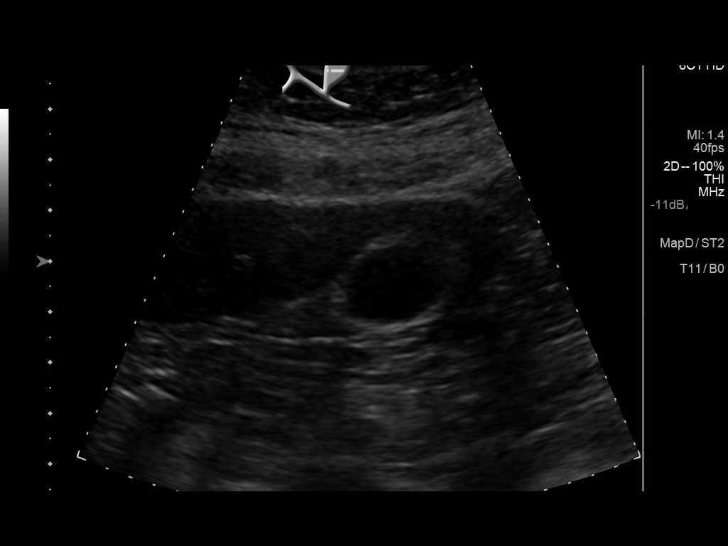
[im 26/153]
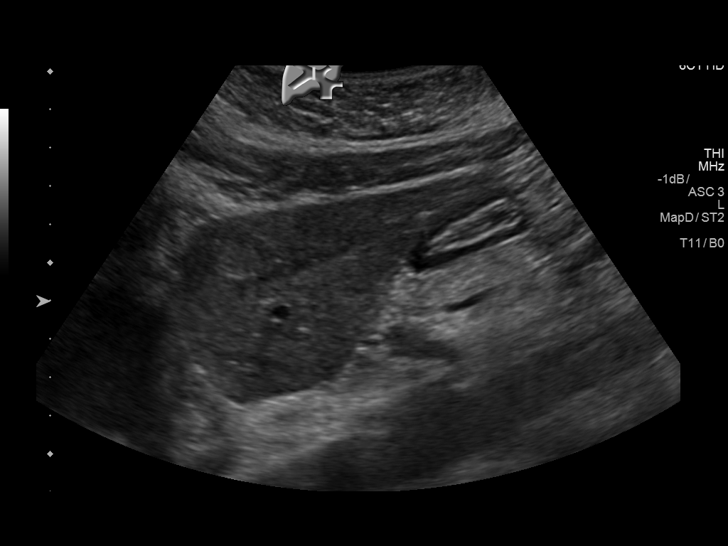
[im 39/153]
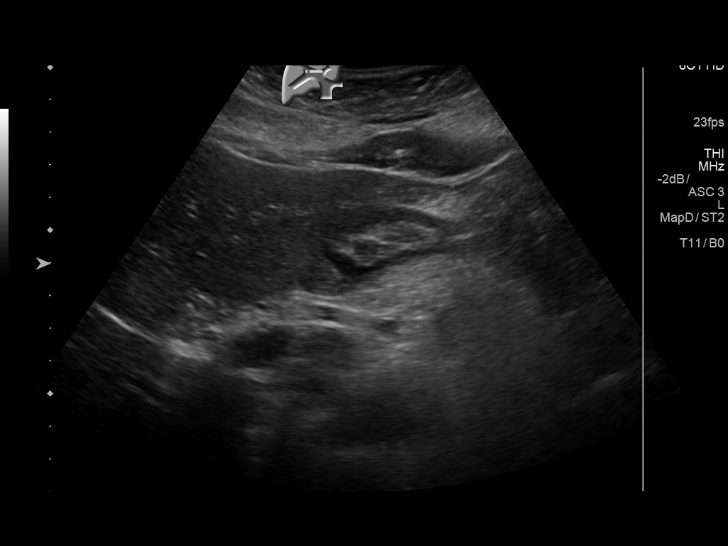
[im 51/153]
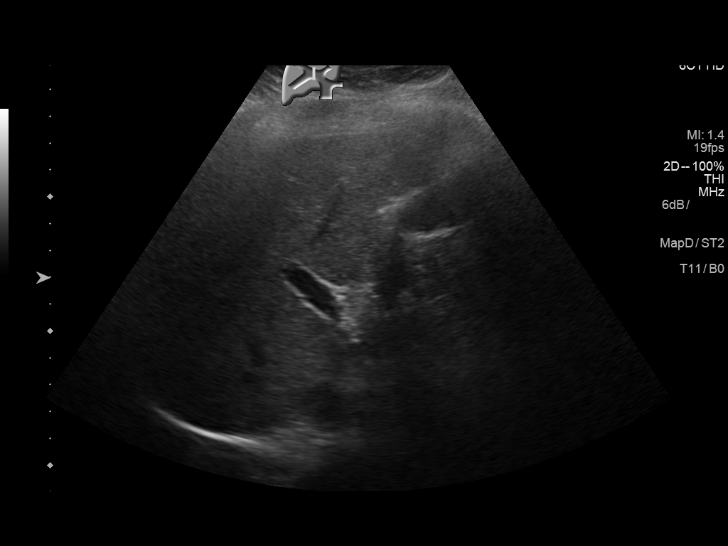
[im 58/153]
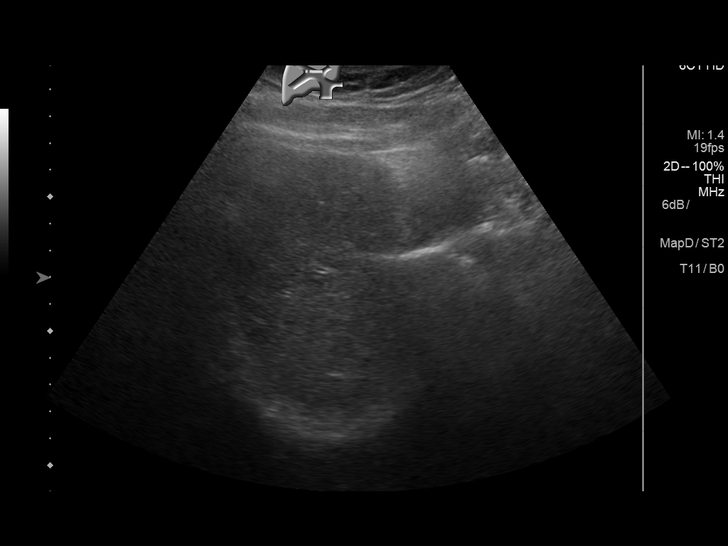
[im 70/153]
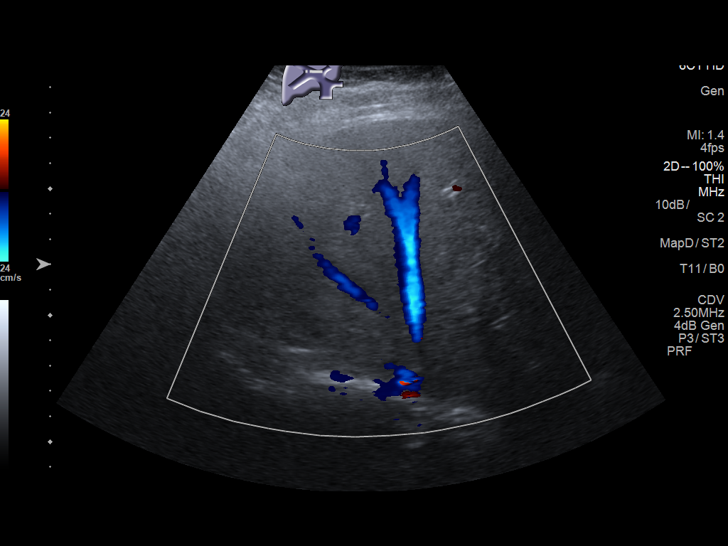
[im 83/153]
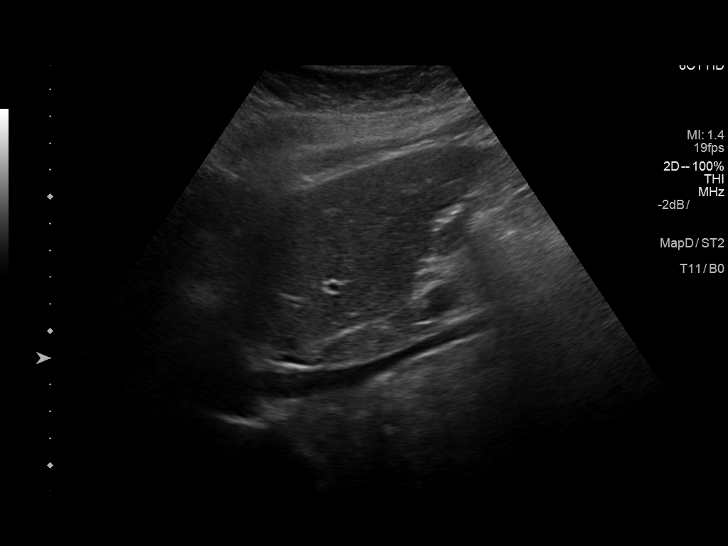
[im 96/153]
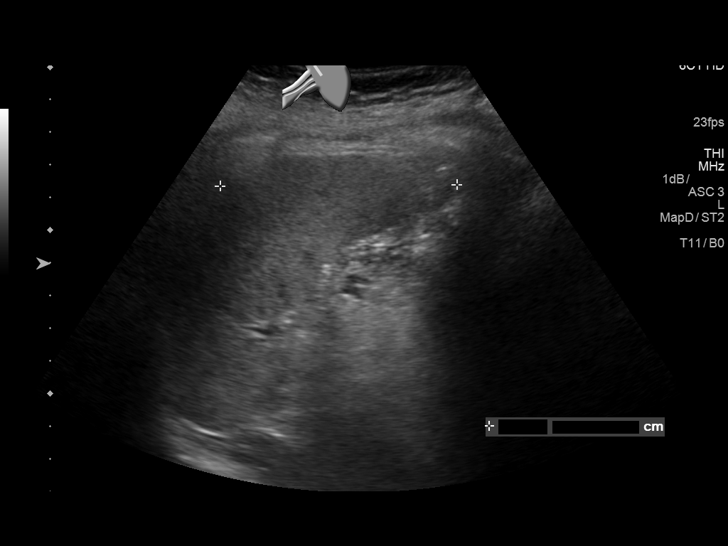
[im 102/153]
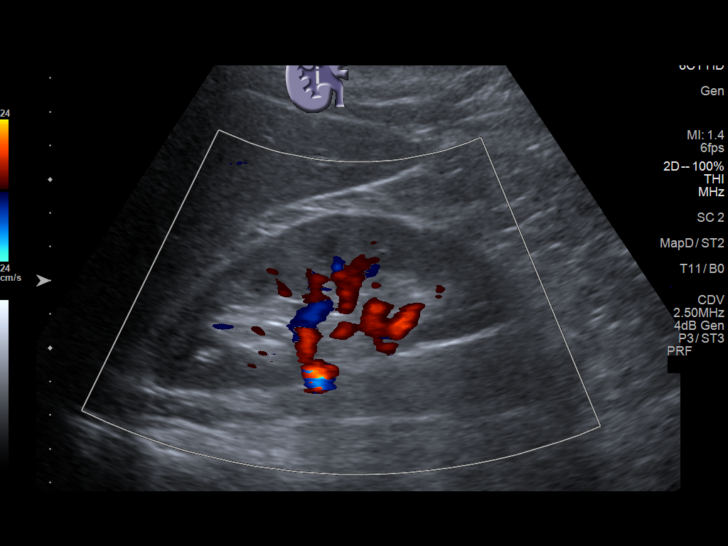
[im 115/153]
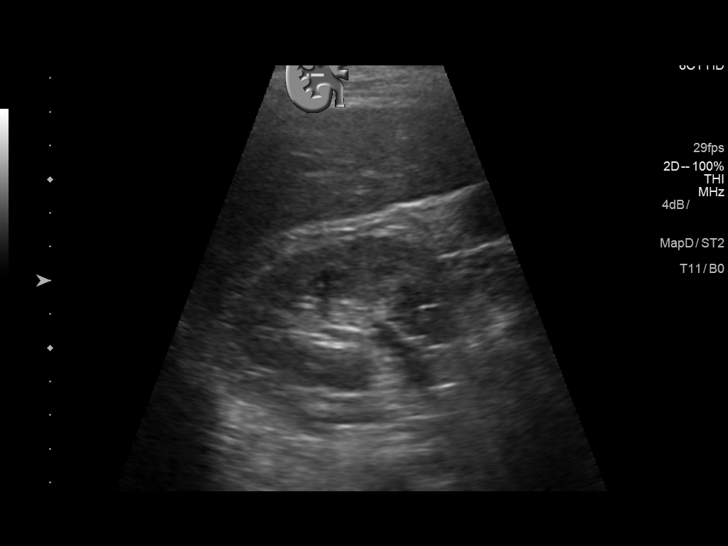
[im 127/153]
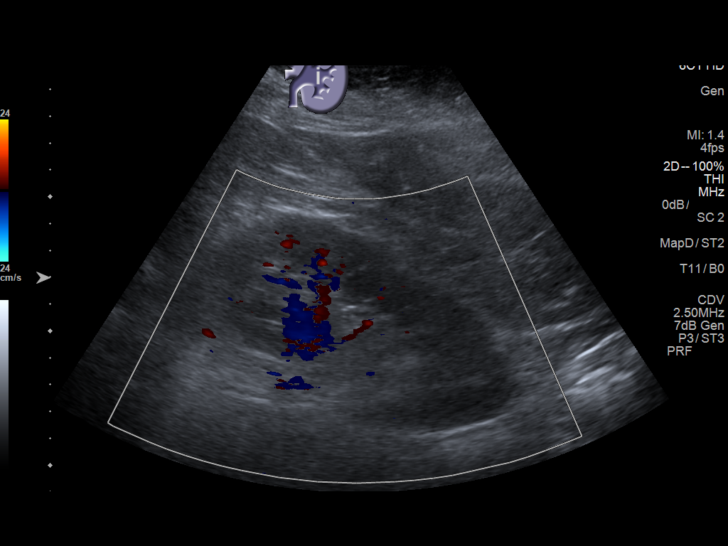
[im 140/153]
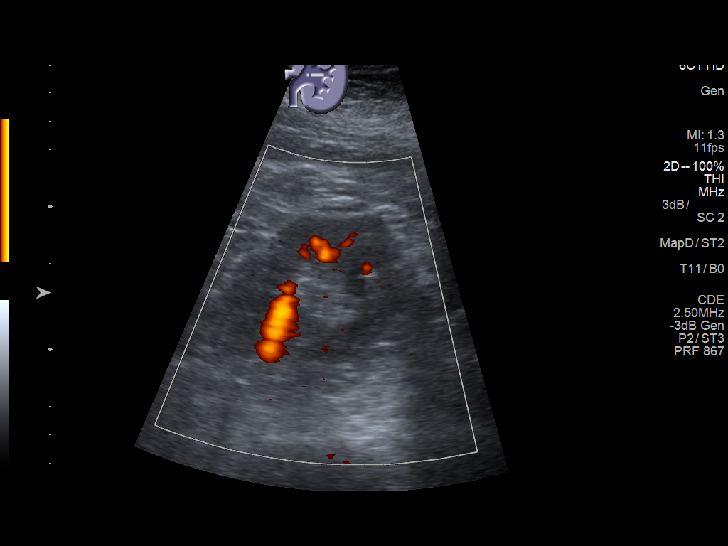
[im 153/153]
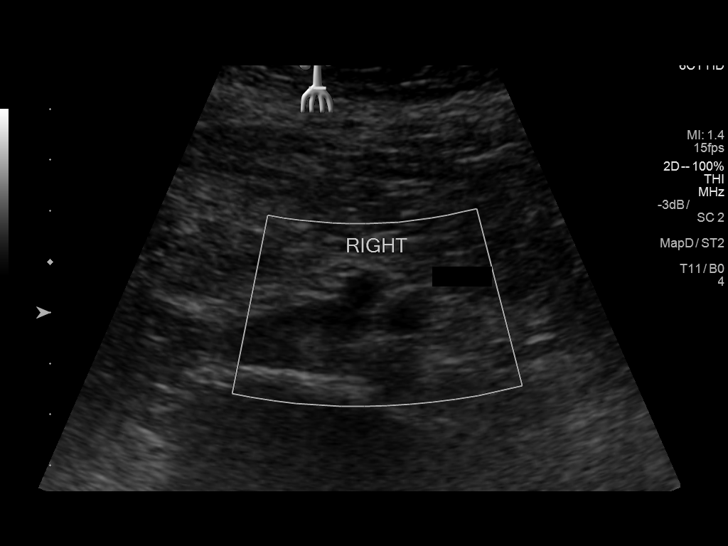

[14 of 25 positions shown; findings below may reference images not displayed]

FINDINGS: Gallbladder: No gallstones or wall thickening visualized. No
sonographic Murphy sign noted by sonographer.

Common bile duct: Diameter: 4 mm, normal

Liver: No focal lesion identified. Within normal limits in
parenchymal echogenicity.

IVC: No abnormality visualized.

Pancreas: Visualized portions appear normal, echotexture within
normal limits.

Spleen: Size and appearance within normal limits. Splenic length
cm.

Right Kidney: Length: 11.9 cm. Echogenicity within normal limits. No
mass or hydronephrosis visualized.

Left Kidney: Length: 12.1 cm. Echogenicity within normal limits. No
mass or hydronephrosis visualized.

Abdominal aorta: No aneurysm visualized.

Other findings: None.
IMPRESSION: Negative abdomen ultrasound.

## 2015-06-18 ENCOUNTER — Encounter (HOSPITAL_COMMUNITY): Payer: Self-pay | Admitting: *Deleted

## 2015-06-18 ENCOUNTER — Emergency Department (HOSPITAL_COMMUNITY): Payer: Self-pay

## 2015-06-18 ENCOUNTER — Emergency Department (HOSPITAL_COMMUNITY)
Admission: EM | Admit: 2015-06-18 | Discharge: 2015-06-19 | Payer: No Typology Code available for payment source | Attending: Emergency Medicine | Admitting: Emergency Medicine

## 2015-06-18 DIAGNOSIS — F200 Paranoid schizophrenia: Secondary | ICD-10-CM | POA: Insufficient documentation

## 2015-06-18 DIAGNOSIS — R05 Cough: Secondary | ICD-10-CM | POA: Insufficient documentation

## 2015-06-18 LAB — URINE MICROSCOPIC-ADD ON

## 2015-06-18 LAB — COMPREHENSIVE METABOLIC PANEL
ALBUMIN: 4.7 g/dL (ref 3.5–5.0)
ALK PHOS: 67 U/L (ref 38–126)
ALT: 30 U/L (ref 14–54)
ANION GAP: 12 (ref 5–15)
AST: 32 U/L (ref 15–41)
BILIRUBIN TOTAL: 0.7 mg/dL (ref 0.3–1.2)
BUN: 17 mg/dL (ref 6–20)
CALCIUM: 9.8 mg/dL (ref 8.9–10.3)
CO2: 25 mmol/L (ref 22–32)
Chloride: 104 mmol/L (ref 101–111)
Creatinine, Ser: 1.11 mg/dL — ABNORMAL HIGH (ref 0.44–1.00)
GFR calc Af Amer: >60 mL/min (ref >60)
GFR, EST NON AFRICAN AMERICAN: 58 mL/min — AB (ref >60)
GLUCOSE: 122 mg/dL — AB (ref 65–99)
Potassium: 3.1 mmol/L — ABNORMAL LOW (ref 3.5–5.1)
Sodium: 141 mmol/L (ref 135–145)
TOTAL PROTEIN: 8.4 g/dL — AB (ref 6.5–8.1)

## 2015-06-18 LAB — ETHANOL

## 2015-06-18 LAB — URINALYSIS, ROUTINE W REFLEX MICROSCOPIC
Glucose, UA: NEGATIVE mg/dL
KETONES UR: 15 mg/dL — AB
NITRITE: NEGATIVE
PROTEIN: 100 mg/dL — AB
Specific Gravity, Urine: 1.026 (ref 1.005–1.030)
pH: 6 (ref 5.0–8.0)

## 2015-06-18 LAB — RAPID URINE DRUG SCREEN, HOSP PERFORMED
Amphetamines: NOT DETECTED
BARBITURATES: NOT DETECTED
Benzodiazepines: NOT DETECTED
Cocaine: NOT DETECTED
Opiates: NOT DETECTED
Tetrahydrocannabinol: NOT DETECTED

## 2015-06-18 LAB — CBC
HEMATOCRIT: 40.2 % (ref 36.0–46.0)
Hemoglobin: 13 g/dL (ref 12.0–15.0)
MCH: 27.1 pg (ref 26.0–34.0)
MCHC: 32.3 g/dL (ref 30.0–36.0)
MCV: 83.9 fL (ref 78.0–100.0)
PLATELETS: 399 10*3/uL (ref 150–400)
RBC: 4.79 MIL/uL (ref 3.87–5.11)
RDW: 14.8 % (ref 11.5–15.5)
WBC: 14 10*3/uL — ABNORMAL HIGH (ref 4.0–10.5)

## 2015-06-18 LAB — TSH: TSH: 2.009 u[IU]/mL (ref 0.350–4.500)

## 2015-06-18 LAB — ACETAMINOPHEN LEVEL

## 2015-06-18 LAB — SALICYLATE LEVEL: Salicylate Lvl: <4 mg/dL (ref 2.8–30.0)

## 2015-06-18 IMAGING — CR DG CHEST 2V
2 series · 2 of 2 positions shown · non-contrast
Comparison: None.

CLINICAL DATA: 49-year-old female with altered mental status and
cough.

EXAM:
CHEST  2 VIEW

[w chest pa]
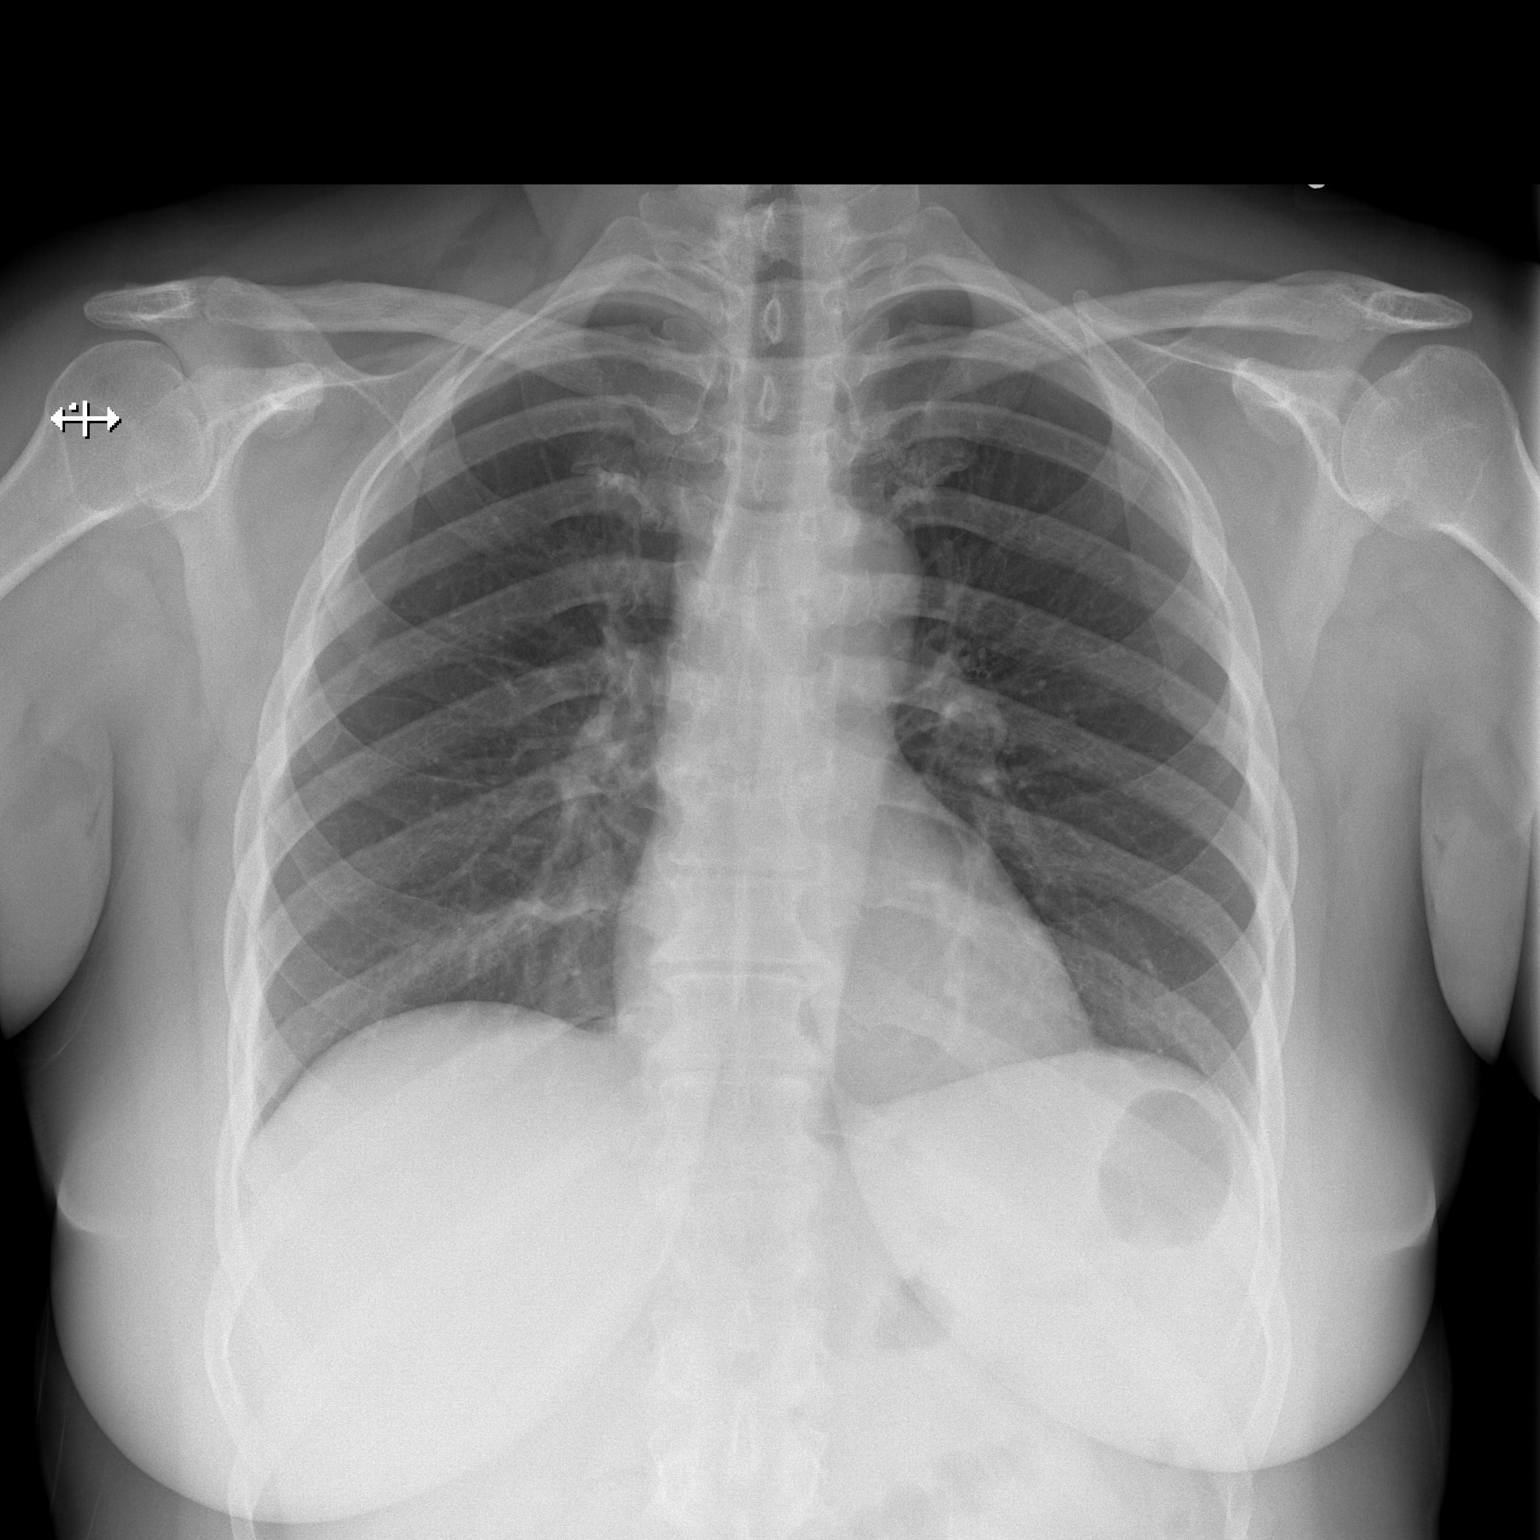

[w chest lat]
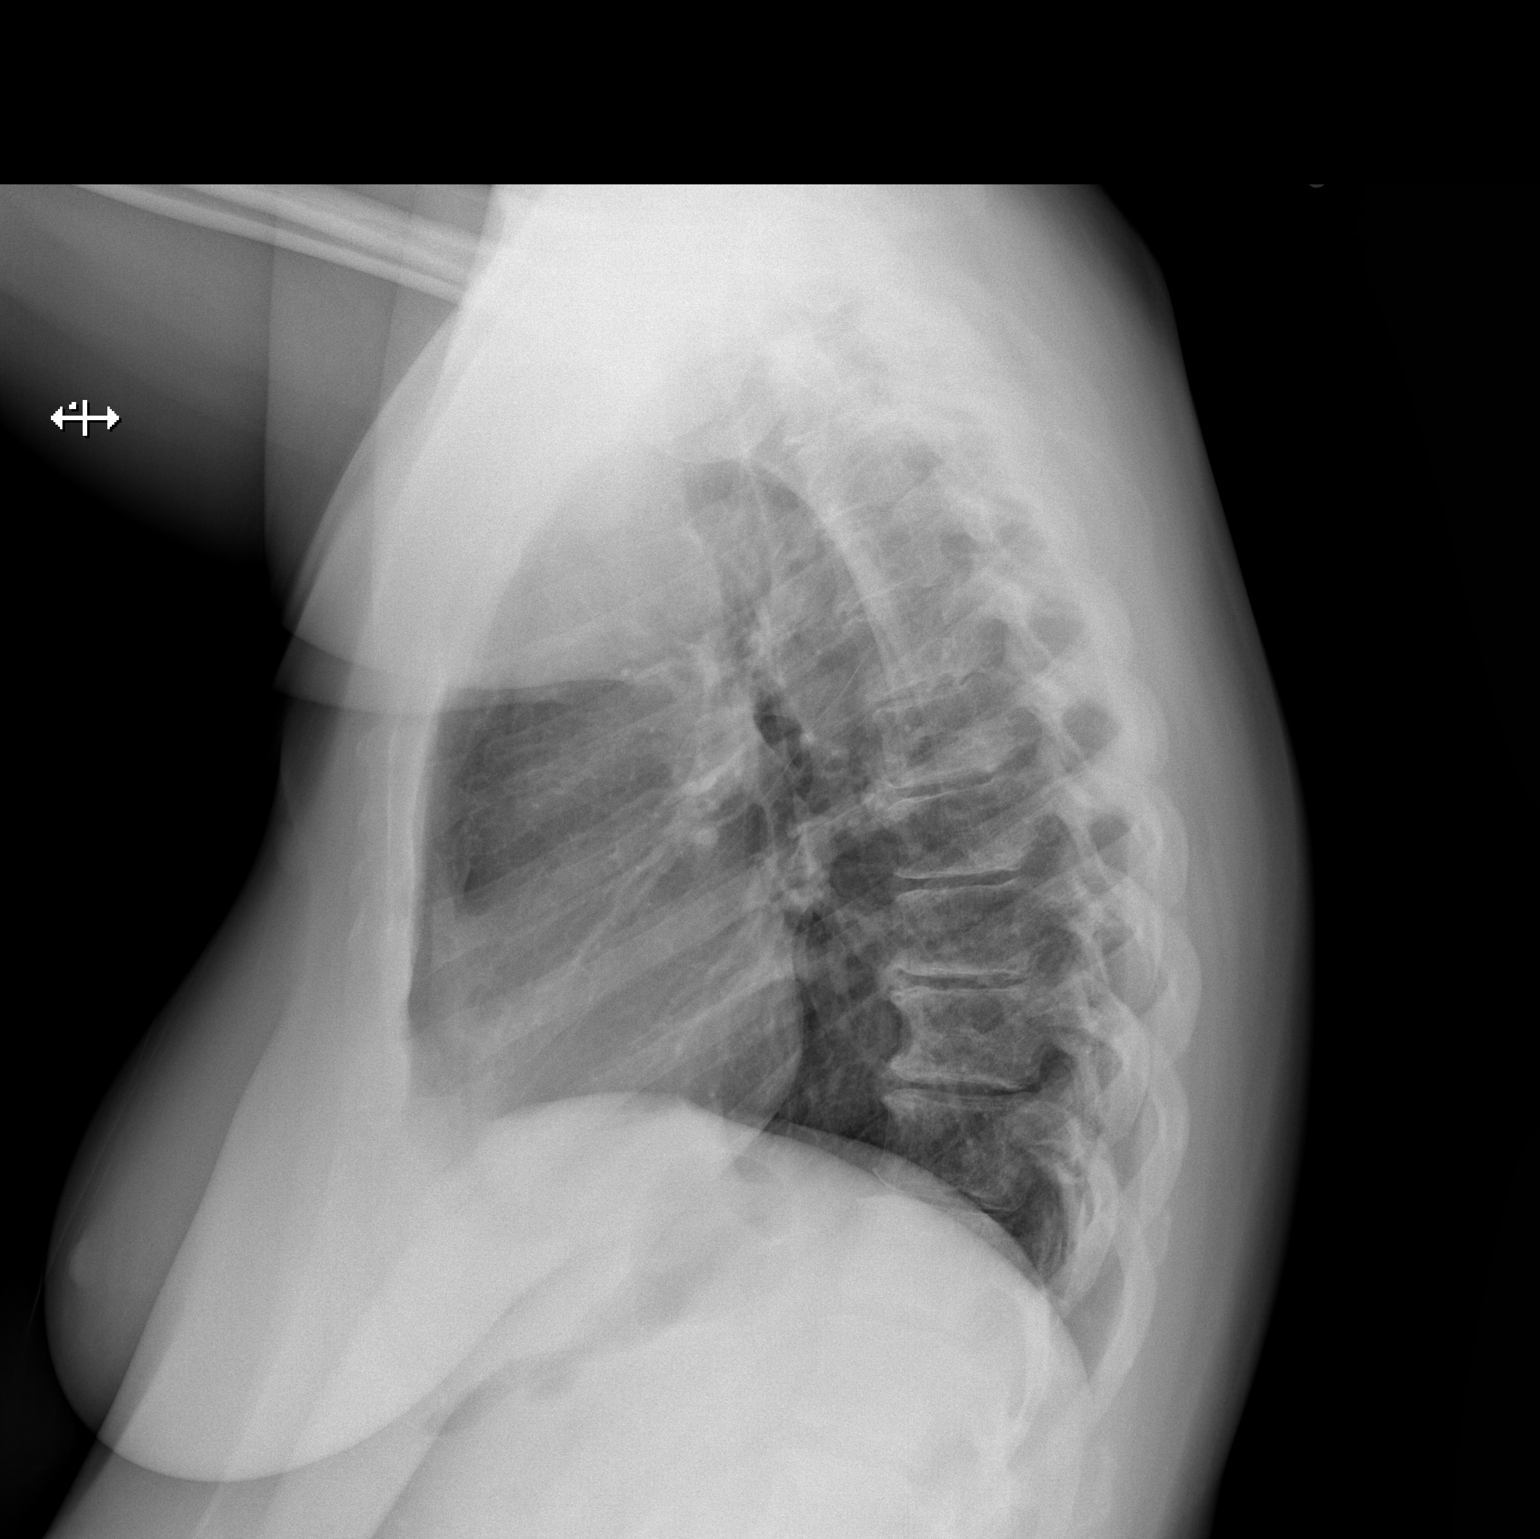

[2 of 2 positions shown; findings below may reference images not displayed]

FINDINGS: The heart size and mediastinal contours are within normal limits.
Both lungs are clear. The visualized skeletal structures are
unremarkable.
IMPRESSION: No active cardiopulmonary disease.

## 2015-06-18 IMAGING — CT CT HEAD W/O CM
2 series · 17 of 30 positions shown, 20 images · non-contrast
Comparison: None.

CLINICAL DATA: 49-year-old female with mental status change

EXAM:
CT HEAD WITHOUT CONTRAST
TECHNIQUE: Contiguous axial images were obtained from the base of the skull
through the vertex without intravenous contrast.

[Series 2: head w/o · axial · non-contrast · 0.42mm/px · z∈[-143,-23]mm · 9 of 30 slices shown, 12 images]
[im 3/30  brain]
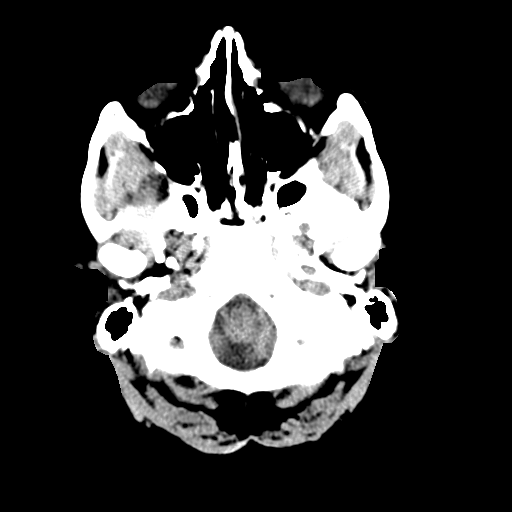
[im 3/30  bone]
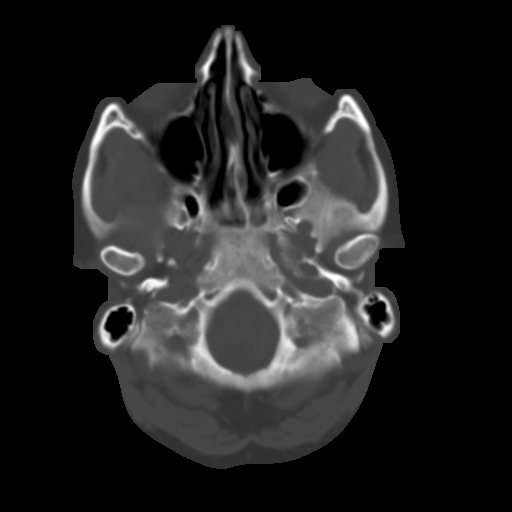
[im 6/30  brain]
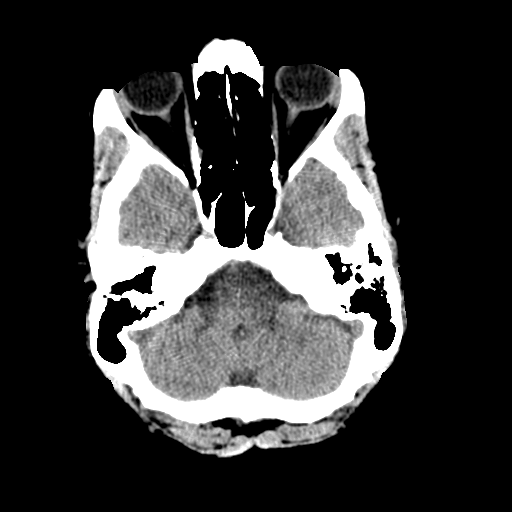
[im 9/30  brain]
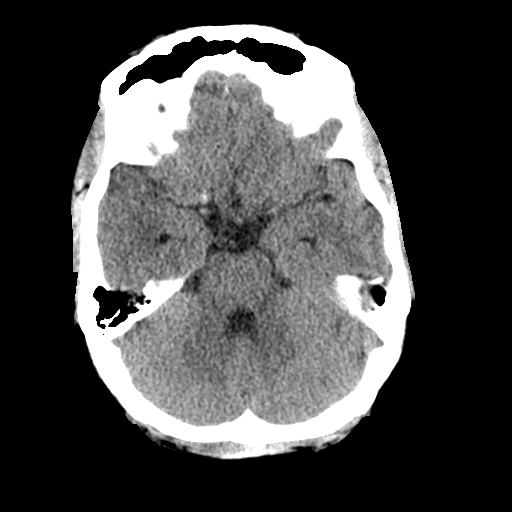
[im 12/30  brain]
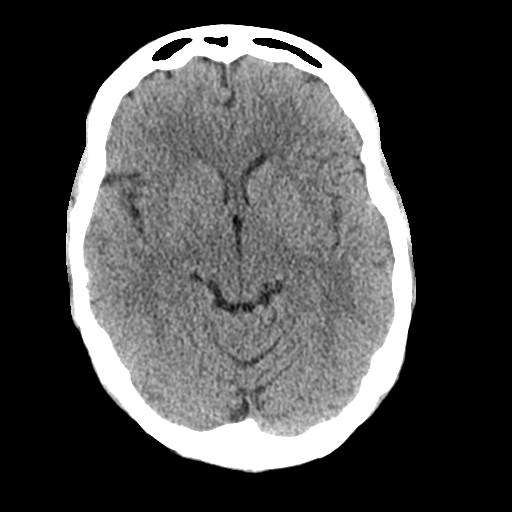
[im 15/30  brain]
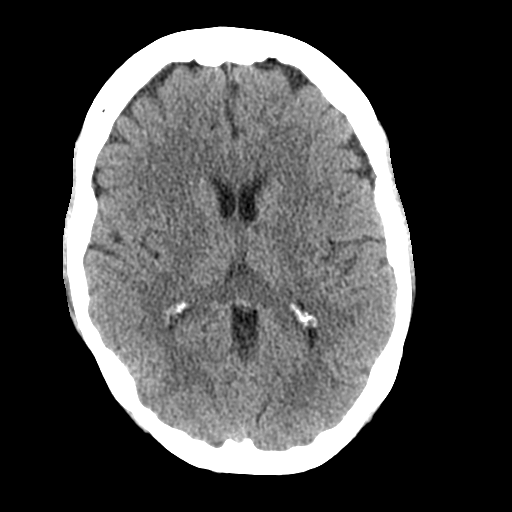
[im 15/30  bone]
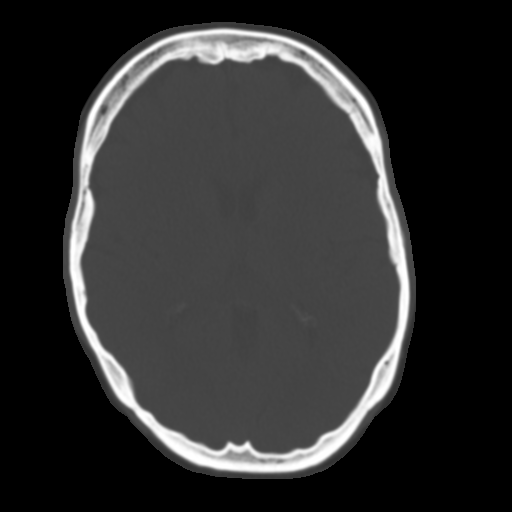
[im 18/30  brain]
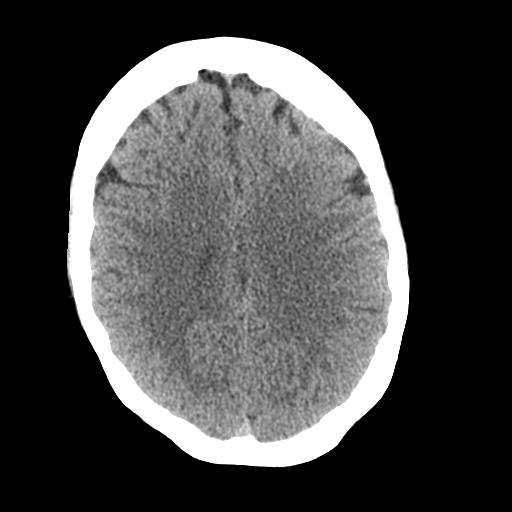
[im 21/30  brain]
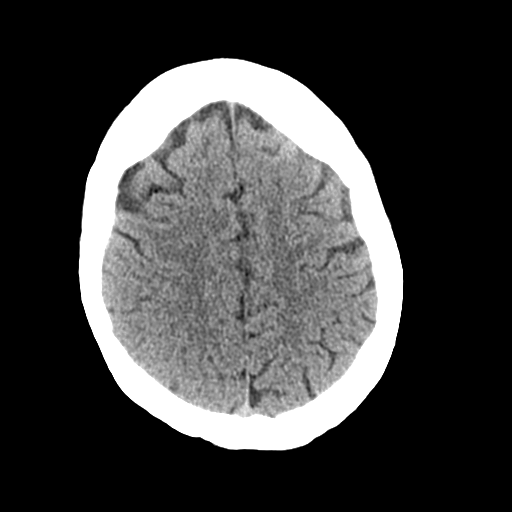
[im 24/30  brain]
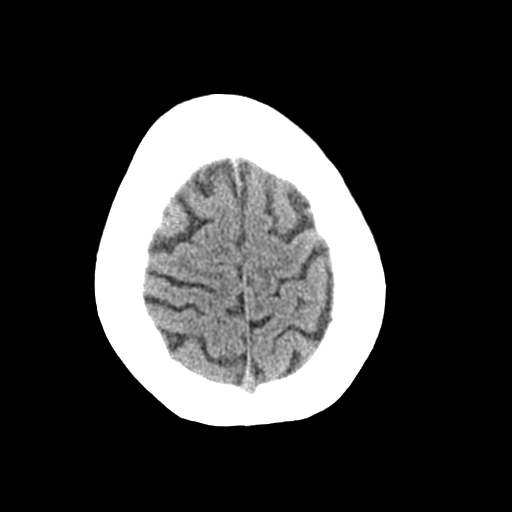
[im 27/30  brain]
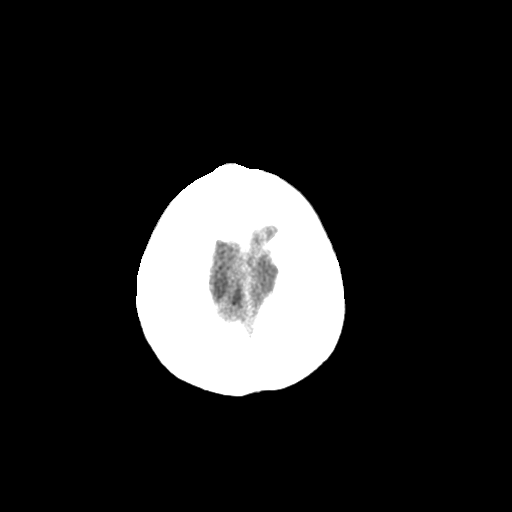
[im 27/30  bone]
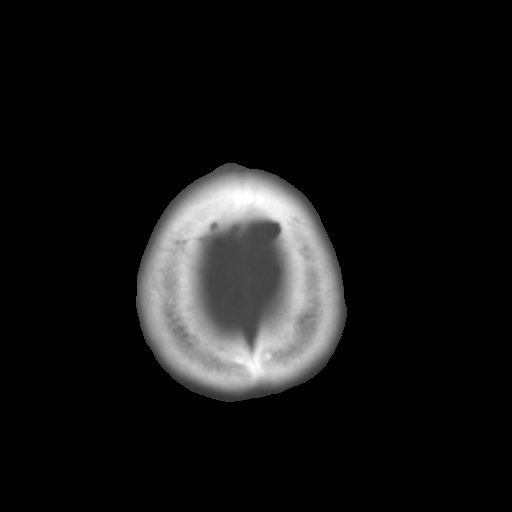

[Series 3: bone windows · axial · 0.42mm/px · z∈[-138,-24]mm · 8 of 50 slices shown]
[im 6/50  bone]
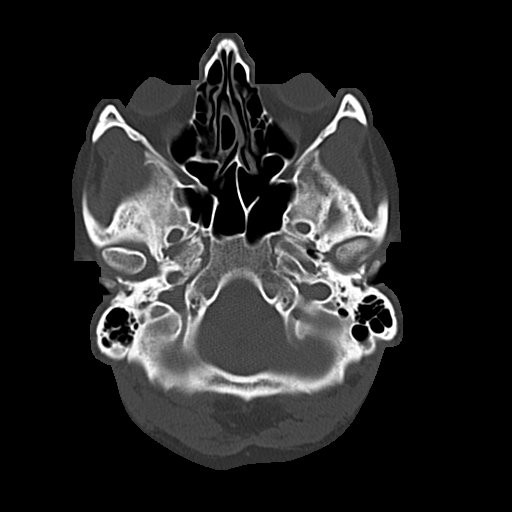
[im 11/50  bone]
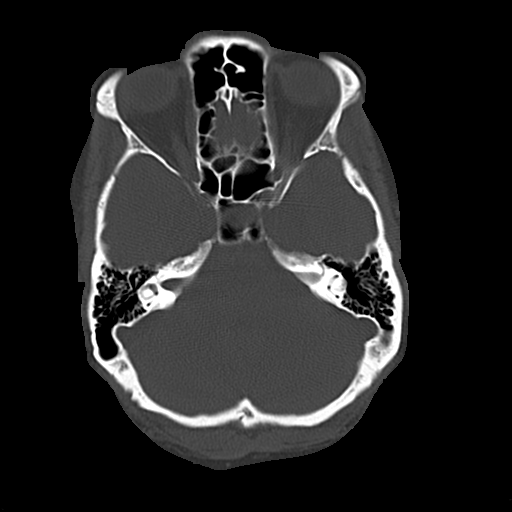
[im 17/50  bone]
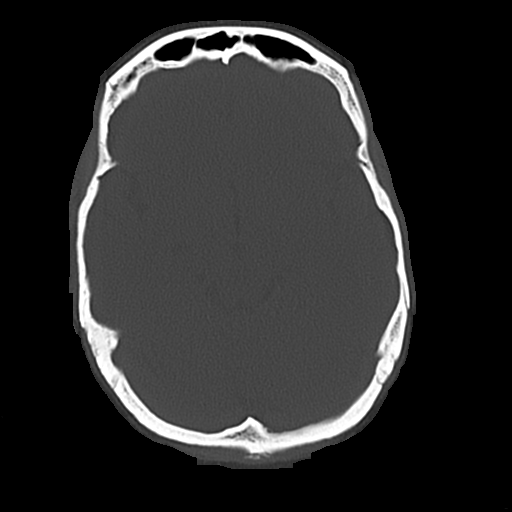
[im 22/50  bone]
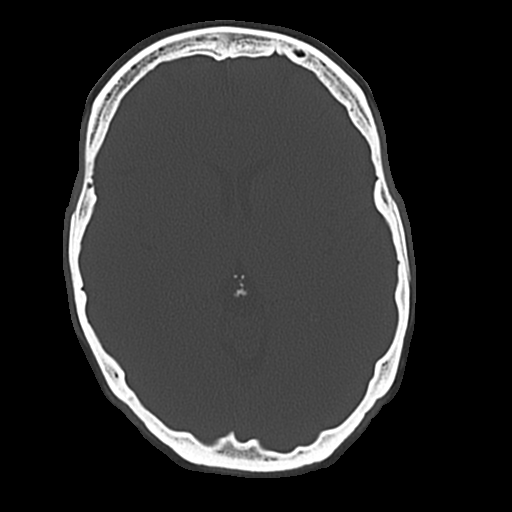
[im 28/50  bone]
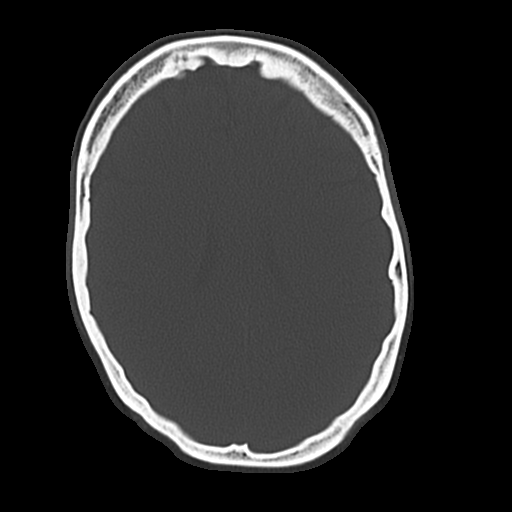
[im 33/50  bone]
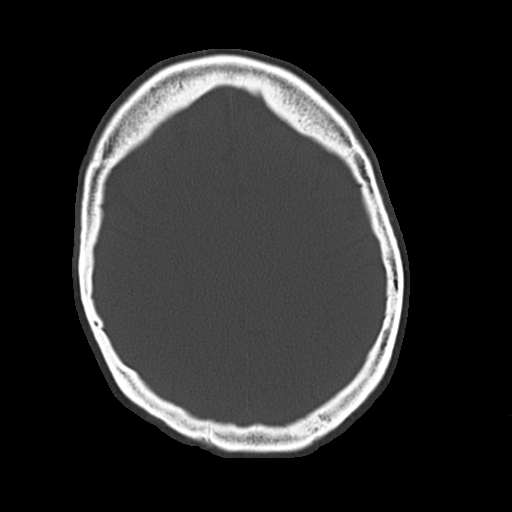
[im 39/50  bone]
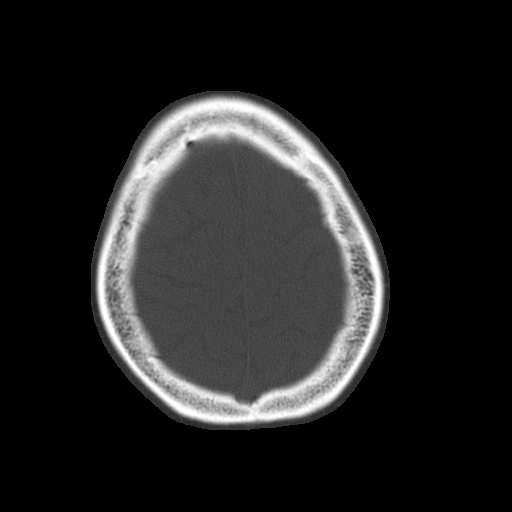
[im 44/50  bone]
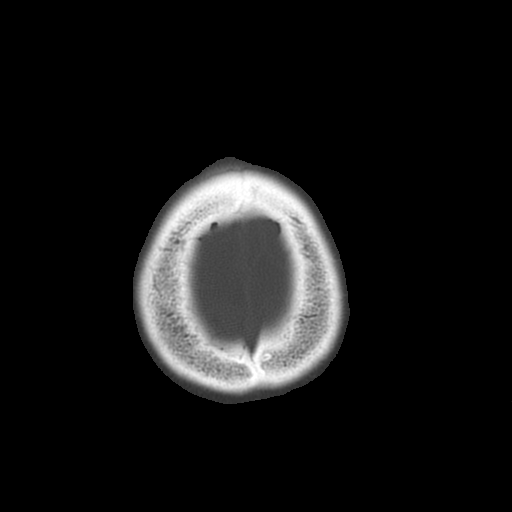

[17 of 30 positions shown; findings below may reference images not displayed]

FINDINGS: The ventricles and the sulci are appropriate in size for the
patient's age. There is no intracranial hemorrhage. No midline shift
or mass effect identified. The gray-white matter differentiation is
preserved.

The visualized paranasal sinuses and mastoid air cells are well
aerated. The calvarium is intact.
IMPRESSION: No acute intracranial pathology.

## 2015-06-18 NOTE — ED Provider Notes (Signed)
CSN: [id number]     Arrival date & time 06/18/15  1955 History   First MD Initiated Contact with Patient 06/18/15 2053     Chief Complaint  Patient presents with  . Manic Behavior  . Cough     (Consider location/radiation/quality/duration/timing/severity/associated sxs/prior Treatment) HPI Patient has a history of paranoid schizophrenia that was diagnosed 15 years ago. The patient however is been very well-controlled in the interim time and has been high functioning without treatments on chronic psychiatric medications. Over the past 4 days the patient has developed behaviors of severe paranoia and not slept for 4 days. The patient reports that she just doesn't get tired and also she feels that she has to be on watching for dangers to her family. Her son reports that she wandered through the house looking out various windows and tried to get him to leave the house in the middle of the night because of concerns for someone trying to harm them. Also per the patient's brother who is with her and the patient's son, she has been sending text messages that were very concerning and showing signs of aggressive behavior. Patient reports that sounds are very acute for her right now. She reports she does not feel tired.  Patient reports that she's had some cough over the past few days. No shortness of breath or chest pain. No documented fever. Patient reports that for several years she has dealt with left lateral abdominal pain. She reports that her physician has done a number of labs and sent her for ultrasound but the causes never been identified. She denies she's having any pain or problems with that at this time. History reviewed. No pertinent past medical history. History reviewed. No pertinent past surgical history. No family history on file. Social History  Substance Use Topics  . Smoking status: Never Smoker   . Smokeless tobacco: None  . Alcohol Use: Yes   OB History    No data available      Review of Systems  10 Systems reviewed and are negative for acute change except as noted in the HPI.   Allergies  Shellfish allergy  Home Medications   Prior to Admission medications   Not on File   BP 184/110 mmHg  Pulse 112  Temp(Src) 98.4 F (36.9 C) (Oral)  Resp 18  SpO2 100%  LMP  (LMP Unknown) Physical Exam  Constitutional: She is oriented to person, place, and time. She appears well-developed and well-nourished.  HENT:  Head: Normocephalic and atraumatic.  Eyes: EOM are normal. Pupils are equal, round, and reactive to light.  Neck: Neck supple.  Cardiovascular: Normal rate, regular rhythm, normal heart sounds and intact distal pulses.   Pulmonary/Chest: Effort normal and breath sounds normal.  Abdominal: Soft. Bowel sounds are normal. She exhibits no distension. There is no tenderness.  Musculoskeletal: Normal range of motion. She exhibits no edema.  Neurological: She is alert and oriented to person, place, and time. She has normal strength. No cranial nerve deficit. She exhibits normal muscle tone. Coordination normal. GCS eye subscore is 4. GCS verbal subscore is 5. GCS motor subscore is 6.  Skin: Skin is warm, dry and intact.  Psychiatric:  Patient is very alert. She will answer my questions and converse but then becomes very distracted by her son and her brother, questioning them and making several nonsequitor comments. When making these, as the nature is suspicious and hostile. She does seem to be responding to internal stimuli.    ED  Course  Procedures (including critical care time) Labs Review Labs Reviewed  COMPREHENSIVE METABOLIC PANEL - Abnormal; Notable for the following:    Potassium 3.1 (*)    Glucose, Bld 122 (*)    Creatinine, Ser 1.11 (*)    Total Protein 8.4 (*)    GFR calc non Af Amer 58 (*)    All other components within normal limits  ACETAMINOPHEN LEVEL - Abnormal; Notable for the following:    Acetaminophen (Tylenol), Serum <10 (*)     All other components within normal limits  CBC - Abnormal; Notable for the following:    WBC 14.0 (*)    All other components within normal limits  URINALYSIS, ROUTINE W REFLEX MICROSCOPIC (NOT AT [facility]) - Abnormal; Notable for the following:    Color, Urine AMBER (*)    APPearance TURBID (*)    Hgb urine dipstick LARGE (*)    Bilirubin Urine MODERATE (*)    Ketones, ur 15 (*)    Protein, ur 100 (*)    Leukocytes, UA TRACE (*)    All other components within normal limits  URINE MICROSCOPIC-ADD ON - Abnormal; Notable for the following:    Squamous Epithelial / LPF TOO NUMEROUS TO COUNT (*)    Bacteria, UA MANY (*)    Crystals CA OXALATE CRYSTALS (*)    All other components within normal limits  ETHANOL  SALICYLATE LEVEL  URINE RAPID DRUG SCREEN, HOSP PERFORMED  TSH  URINALYSIS, ROUTINE W REFLEX MICROSCOPIC (NOT AT [facility])    Imaging Review Dg Chest 2 View  06/18/2015  CLINICAL DATA:  50 year old female with altered mental status and cough. EXAM: CHEST  2 VIEW COMPARISON:  None. FINDINGS: The heart size and mediastinal contours are within normal limits. Both lungs are clear. The visualized skeletal structures are unremarkable. IMPRESSION: No active cardiopulmonary disease. Electronically Signed   By: Elgie Collard M.D.   On: 06/18/2015 21:55   Ct Head Wo Contrast  06/19/2015  CLINICAL DATA:  50 year old female with mental status change EXAM: CT HEAD WITHOUT CONTRAST TECHNIQUE: Contiguous axial images were obtained from the base of the skull through the vertex without intravenous contrast. COMPARISON:  None. FINDINGS: The ventricles and the sulci are appropriate in size for the patient's age. There is no intracranial hemorrhage. No midline shift or mass effect identified. The gray-white matter differentiation is preserved. The visualized paranasal sinuses and mastoid air cells are well aerated. The calvarium is intact. IMPRESSION: No acute intracranial pathology. Electronically Signed    By: Elgie Collard M.D.   On: 06/19/2015 00:16   I have personally reviewed and evaluated these images and lab results as part of my medical decision-making.   EKG Interpretation None      MDM   Final diagnoses:  Paranoid schizophrenia (HCC)   At this time findings are most consistent with a psychotic break with paranoid schizophrenia. Patient does have history of one prior episode of exacerbation. The patient is alert and does not show signs of acute medical illness. She is ambulatory about the emergency department with coordinated gait. Her mental status is alert and her speech is very clear. She waxes and wanes from speech that is situationally appropriate and focused on my interview, to being easily distracted to her family members and hypervigilant regarding their activities, questioning them regarding to whom they are speaking and what they're thinking. Family members report that she's been showing signs of being aggressive and appears at significant risk for poor decision making that  might put her at risk or family members. Patient is currently medically cleared for further psychiatric care.    Arby Barrette, MD 06/19/15 [phone]

## 2015-06-18 NOTE — ED Notes (Addendum)
Pt brought in by her brother due to bursts of laughter and not sleeping for the past 4 days. Brother states the pt was diagnosed with paranoid schizophrenia 15 years ago and has periods of paranoid, manic behavior. Brother is concerned the pt will hurt her family or herself. Brother states the pt had a manic episode 15 years ago with similar symptoms. Pt refuses to take medication.  Pt states she has a heightened sense of clarity. Pt states she has also had a cough for the past few weeks. Pt has flight of ideas and bursts of laughter in triage. Brother states the pt has appeared paranoid for the past 4 days.

## 2015-06-18 NOTE — ED Notes (Signed)
Called pt from waiting room without answer.  

## 2015-06-19 ENCOUNTER — Inpatient Hospital Stay
Admission: EM | Admit: 2015-06-19 | Discharge: 2015-06-25 | DRG: 885 | Disposition: A | Discharge status: Home / Self Care | Payer: No Typology Code available for payment source | Source: Intra-hospital | Attending: Psychiatry | Admitting: Psychiatry

## 2015-06-19 DIAGNOSIS — E876 Hypokalemia: Secondary | ICD-10-CM | POA: Diagnosis present

## 2015-06-19 DIAGNOSIS — R109 Unspecified abdominal pain: Secondary | ICD-10-CM

## 2015-06-19 DIAGNOSIS — G4700 Insomnia, unspecified: Secondary | ICD-10-CM | POA: Diagnosis present

## 2015-06-19 DIAGNOSIS — R1012 Left upper quadrant pain: Secondary | ICD-10-CM | POA: Diagnosis present

## 2015-06-19 DIAGNOSIS — G8929 Other chronic pain: Secondary | ICD-10-CM | POA: Diagnosis present

## 2015-06-19 DIAGNOSIS — Z818 Family history of other mental and behavioral disorders: Secondary | ICD-10-CM

## 2015-06-19 DIAGNOSIS — F319 Bipolar disorder, unspecified: Secondary | ICD-10-CM | POA: Diagnosis present

## 2015-06-19 DIAGNOSIS — E871 Hypo-osmolality and hyponatremia: Secondary | ICD-10-CM | POA: Diagnosis present

## 2015-06-19 DIAGNOSIS — I10 Essential (primary) hypertension: Secondary | ICD-10-CM | POA: Diagnosis present

## 2015-06-19 DIAGNOSIS — F200 Paranoid schizophrenia: Secondary | ICD-10-CM | POA: Diagnosis present

## 2015-06-19 DIAGNOSIS — F419 Anxiety disorder, unspecified: Secondary | ICD-10-CM | POA: Diagnosis present

## 2015-06-19 DIAGNOSIS — R52 Pain, unspecified: Secondary | ICD-10-CM

## 2015-06-19 DIAGNOSIS — R451 Restlessness and agitation: Secondary | ICD-10-CM | POA: Diagnosis present

## 2015-06-19 DIAGNOSIS — E785 Hyperlipidemia, unspecified: Secondary | ICD-10-CM | POA: Diagnosis present

## 2015-06-19 DIAGNOSIS — Z8249 Family history of ischemic heart disease and other diseases of the circulatory system: Secondary | ICD-10-CM | POA: Diagnosis not present

## 2015-06-19 DIAGNOSIS — F3110 Bipolar disorder, current episode manic without psychotic features, unspecified: Secondary | ICD-10-CM

## 2015-06-19 DIAGNOSIS — I674 Hypertensive encephalopathy: Secondary | ICD-10-CM

## 2015-06-19 DIAGNOSIS — F250 Schizoaffective disorder, bipolar type: Secondary | ICD-10-CM

## 2015-06-19 HISTORY — DX: Paranoid schizophrenia: F20.0

## 2015-06-19 LAB — URINALYSIS, ROUTINE W REFLEX MICROSCOPIC
Bilirubin Urine: NEGATIVE
Glucose, UA: NEGATIVE mg/dL
Ketones, ur: 40 mg/dL — AB
LEUKOCYTES UA: NEGATIVE
NITRITE: NEGATIVE
PH: 6 (ref 5.0–8.0)
Protein, ur: NEGATIVE mg/dL
SPECIFIC GRAVITY, URINE: 1.021 (ref 1.005–1.030)

## 2015-06-19 LAB — URINE MICROSCOPIC-ADD ON

## 2015-06-19 MED ORDER — CLONIDINE HCL 0.1 MG PO TABS
0.1000 mg | ORAL_TABLET | Freq: Two times a day (BID) | ORAL | Status: DC
  Filled 2015-06-19 (×2): qty 1

## 2015-06-19 MED ORDER — MAGNESIUM HYDROXIDE 400 MG/5ML PO SUSP: 30.0000 mL | Freq: Every day | ORAL | Status: DC | PRN

## 2015-06-19 MED ORDER — ACETAMINOPHEN 325 MG PO TABS: 650.0000 mg | ORAL_TABLET | Freq: Four times a day (QID) | ORAL | Status: DC | PRN

## 2015-06-19 MED ORDER — LORAZEPAM 1 MG PO TABS: 1.0000 mg | ORAL_TABLET | Freq: Three times a day (TID) | ORAL | Status: DC | PRN

## 2015-06-19 MED ORDER — ONDANSETRON HCL 4 MG PO TABS: 4.0000 mg | ORAL_TABLET | Freq: Three times a day (TID) | ORAL | Status: DC | PRN

## 2015-06-19 MED ORDER — ZOLPIDEM TARTRATE 5 MG PO TABS: 5.0000 mg | ORAL_TABLET | Freq: Every evening | ORAL | Status: DC | PRN

## 2015-06-19 MED ORDER — ZOLPIDEM TARTRATE 5 MG PO TABS
5.0000 mg | ORAL_TABLET | Freq: Every evening | ORAL | Status: DC | PRN
  Administered 2015-06-19: 5 mg via ORAL
  Filled 2015-06-19: qty 1

## 2015-06-19 MED ORDER — LORAZEPAM 1 MG PO TABS
1.0000 mg | ORAL_TABLET | Freq: Three times a day (TID) | ORAL | Status: DC | PRN
  Administered 2015-06-19 – 2015-06-25 (×3): 1 mg via ORAL
  Filled 2015-06-19 (×3): qty 1

## 2015-06-19 MED ORDER — CLONIDINE HCL 0.1 MG PO TABS
0.1000 mg | ORAL_TABLET | Freq: Two times a day (BID) | ORAL | Status: DC
  Administered 2015-06-19 – 2015-06-20 (×2): 0.1 mg via ORAL
  Filled 2015-06-19 (×2): qty 1

## 2015-06-19 MED ORDER — ALUM & MAG HYDROXIDE-SIMETH 200-200-20 MG/5ML PO SUSP: 30.0000 mL | ORAL | Status: DC | PRN

## 2015-06-19 MED ORDER — ACETAMINOPHEN 325 MG PO TABS: 650.0000 mg | ORAL_TABLET | ORAL | Status: DC | PRN

## 2015-06-19 NOTE — ED Notes (Signed)
Margaret Christian and Margaret Christian attempted to change Pt into scrubs.  Pt refused.  However, her reasoning was a bunch of gibberish/flight of ideas.  Will wait until Pt is ready to be moved to TCU or S[facility] Unit.

## 2015-06-19 NOTE — BH Assessment (Addendum)
Tele Assessment Note   Margaret Christian is an 49 y.o. female who was brought into the [facility] at the urging of her brother who was concerned at a recent change in his sister's behavior. Per pt record, pt's son who lives with her was concerned as well. Per pt record, pt's brother describes "burst of laughter" without any reason to laugh. Also, brother sts that pt is talking to herself and seems to be talking to someone else.  Brother sts that pt is making paranoid comments. Pt was observed to exhibit all the previously mentioned behaviors during the assessment. Pt sts that for the last 4 days she has not been able to sleep due to "churning feelings" brought on she sts by financial concerns (student loan default in December 2016, possible loss of [employer] insurance and problems with her car.)   Pt sts that about 4 days ago a work friend's BF died and she was very concerned about her friend. Pt denies SI, HI, SHI and AVH. However, pt had several bursts of sudden, inappropriate laughter.  Pt made several comments directed at this writer concerning her "real intentions" doing an assessment, intrusive/paranoid uses of the TA equipment (camera) and others at [employer] denying people jobs. Among other random statements pt suddenly stated abruptly,  "Tell Miss O I have it and I will never let go of it." and " there is a camera in her room because [facility] does not want to hire people."   When asked to elaborate or further explain her comments pt began talking to someone else in the room who was not there. Several times, pt stated, "yes....yes.... No this time" and other short answers as if in conversation and being directed by someone else.   Pt sts she lives with hier 75 yo son who she sts she is very proud of.  Pt sts that her son's father is deceased.  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 Pt sts that she does not feel support but, rather judgement from her brother  and sister-in-law.   Pt sts her relationship with her mother "is improving" but pt sts she feels limited in developing their relationship further and her mother's relationship with her son because her mother living in [place].  Pt was dressed in scrubs and sitting on her hospital bed. Pt was alert, suspicious and irritable. As the assessment continued, pt seemed to become more comfortable as she spoke less sharply and less hesitantly. Pt kept good eye contact and commented if this writer ever dropped eye contact.  Pt spoke in a clear tone and normal pace. Pt moved in a normal manner when moving. Pt's thought process was coherent and relevant and judgement was impaired.  Pt's mood was not depressed as pt denied all depression symptoms and their blunted affect was incongruent. Pt sts that she had a panic attack the day before but stated she was not currently anxious at all forming a zero with her hands to indicate 0 anxiety.  Pt was oriented x 4, to person, place, time and situation.   Diagnosis: 311  Past Medical History: History reviewed. No pertinent past medical history.  History reviewed. No pertinent past surgical history.  Family History: No family history on file.  Social History:  reports that she has never smoked. She does not have any smokeless tobacco history on file. She reports that she drinks alcohol. Her drug history is not  on file.  Additional Social History:  Alcohol / Drug Use Prescriptions: See PTA list History of alcohol / drug use?: Yes Substance #1 Name of Substance 1: Alcohol 1 - Age of First Use: sip at father's knee of wine 1 - Amount (size/oz): 3/4 a glass of wine 1 - Frequency: 3 x year 1 - Last Use / Amount: New Year's Eve  CIWA: CIWA-Ar BP: (!) 184/110 mmHg Pulse Rate: 112 COWS:    PATIENT STRENGTHS: (choose at least two) Average or above average intelligence Capable of independent living Communication skills Supportive family/friends  Allergies:   Allergies  Allergen Reactions  . Shellfish Allergy Nausea And Vomiting    Home Medications:  (Not in a hospital admission)  OB/GYN Status:  No LMP recorded (lmp unknown).  General Assessment Data [facility] of [facility]: [facility] TTS Assessment: In system Is this a Tele or Face-to-Face Assessment?: Tele Assessment Is this an Initial Assessment or a Re-assessment for this encounter?: Initial Assessment Marital status: Single Maiden name: na Is patient pregnant?: Unknown Pregnancy Status: Unknown Living Arrangements: Children (lives w 15yo son) Can pt return to current living arrangement?: Yes Admission Status: Voluntary Is patient capable of signing voluntary admission?: Yes Referral Source: Self/Family/Friend Insurance type: none  Medical Screening Exam [facility] Walk-in ONLY) Medical Exam completed: Yes  Crisis Care Plan Living Arrangements: Children (lives w 15yo son) Name of Psychiatrist: none Name of Therapist: none (limited IPT in past)  Education Status Is patient currently in school?: No Current Grade: na Highest grade of school patient has completed: 12 (BA in Arts, [place]) Name of school: na Contact person: na  Risk to self with the past 6 months Suicidal Ideation: No (denies) Has patient been a risk to self within the past 6 months prior to admission? : No Suicidal Intent: No Has patient had any suicidal intent within the past 6 months prior to admission? : No Is patient at risk for suicide?: No Suicidal Plan?: No Has patient had any suicidal plan within the past 6 months prior to admission? : No Access to Means: No (denies) What has been your use of drugs/alcohol within the last 12 months?: occasional Previous Attempts/Gestures: No How many times?: 0 Other Self Harm Risks: none Triggers for Past Attempts:  (na) Intentional Self Injurious Behavior: None Family Suicide History: No Recent stressful life event(s): Financial Problems (school loan default 05/2015  and car problems) Persecutory voices/beliefs?: Yes Depression: Yes Depression Symptoms:  (denies depression symptoms) Substance abuse history and/or treatment for substance abuse?: Yes Suicide prevention information given to non-admitted patients: Not applicable  Risk to Others within the past 6 months Homicidal Ideation: No (denies) Does patient have any lifetime risk of violence toward others beyond the six months prior to admission? : No (denies) Thoughts of Harm to Others: No (denies) Current Homicidal Intent: No Current Homicidal Plan: No Access to Homicidal Means: No (denies) Identified Victim: none History of harm to others?: No (denies) Assessment of Violence: None Noted Does patient have access to weapons?: No (denies) Criminal Charges Pending?: No Does patient have a court date: No Is patient on probation?: No  Psychosis Hallucinations: None noted (denies AVH; denies hx of AVH) Delusions: None noted  Mental Status Report Appearance/Hygiene: Disheveled, In scrubs Eye Contact: Good Motor Activity: Freedom of movement Speech: Logical/coherent, Aggressive Level of Consciousness: Alert Mood: Angry, Irritable Affect: Irritable Anxiety Level: None Judgement: Partial Orientation: Person, Place, Time, Situation Obsessive Compulsive Thoughts/Behaviors: Minimal  Cognitive Functioning Memory: Recent Intact, Remote Intact IQ: Average  Insight: Fair Impulse Control: Fair Appetite: Fair Weight Loss: 0 Weight Gain: 0 Sleep: No Change Total Hours of Sleep: 7 Vegetative Symptoms: None  ADLScreening [facility] Assessment Services) Patient's cognitive ability adequate to safely complete daily activities?: Yes Patient able to express need for assistance with ADLs?: Yes Independently performs ADLs?: Yes (appropriate for developmental age)  Prior Inpatient Therapy Prior Inpatient Therapy: Yes Prior Therapy Dates: 2002 Prior Therapy Facilty/Provider(s): don't remember Reason  for Treatment: don't remember  Prior Outpatient Therapy Prior Outpatient Therapy: No (denies) Prior Therapy Dates: na Prior Therapy Facilty/Provider(s): na Reason for Treatment: na Does patient have an ACCT team?: No Does patient have Intensive In-House Services?  : No Does patient have [facility] services? : No Does patient have P4CC services?: No  ADL Screening (condition at time of admission) Patient's cognitive ability adequate to safely complete daily activities?: Yes Patient able to express need for assistance with ADLs?: Yes Independently performs ADLs?: Yes (appropriate for developmental age)       Abuse/Neglect Assessment (Assessment to be complete while patient is alone) Physical Abuse: Denies Verbal Abuse: Denies Sexual Abuse: Denies Exploitation of patient/patient's resources: Denies Self-Neglect: Denies     Merchant navy officer (For Healthcare) Does patient have an advance directive?: No Would patient like information on creating an advanced directive?: No - patient declined information    Additional Information 1:1 In Past 12 Months?: No CIRT Risk: No Elopement Risk: No Does patient have medical clearance?: Yes     Disposition:  Disposition Initial Assessment Completed for this Encounter: Yes Disposition of Patient: Other dispositions (Pending review w [facility] Extender) Other disposition(s): Other (Comment)   Per Donell Sievert, PA: Meets IP criteria. Recommend IP tx.   Per Clint Bolder, AC:Per Clint Bolder, [facility]: No appropriate bed available at [facility] currently.  TTS will seek placement and re-consider pt if discharges before placement.  Spoke with Dr. Madilyn Hook, EDP at [facility]: Advised of recommendation. She voiced agreement.  Beryle Flock, MS, CRC, [facility] Triage Specialist [facility] T 06/19/2015 1:26 AM

## 2015-06-19 NOTE — ED Notes (Signed)
Pt offered CATAPRES and educated on reasons why it was ordered.  Pt reports "I'm so confused.  I don't know what to pick."  Also, Pt does not remember refusing medication last night.

## 2015-06-19 NOTE — BH Assessment (Signed)
Patient has been accepted to [facility].  Accepting physician is Dr. Lucianne Muss on the behalf of Dr. Ardyth Harps.  Attending Physician will be Dr. Ardyth Harps.  Patient has been assigned to room 322, by [facility] Charge Nurse Victorino Dike.  Call report to [phone].  Representative/Transfer Coordinator is Rodrigus Kilker.  [facility] ER Staff (Brandy, TTS) made aware of acceptance.

## 2015-06-19 NOTE — ED Notes (Signed)
This Water engineer explained to the Pt, again, that she is IVC'd.  Pt changed into scrubs and wanded by Security.  All belongings (1 bag) taken to [facility].  While Pt was changing, she was mumbling to herself and started laughing.

## 2015-06-19 NOTE — ED Notes (Signed)
Pt refuses to get undressed and to let us get a set of vital signs on her.

## 2015-06-19 NOTE — Consult Note (Signed)
[facility] Face-to-Face Psychiatry Consult   Reason for Consult:  Bizarre behavior Referring Physician:  EDP Patient Identification: Margaret Christian MRN:  [id number] Principal Diagnosis: Schizoaffective disorder, bipolar type (HCC) Diagnosis:   Patient Active Problem List   Diagnosis Date Noted  . Schizoaffective disorder, bipolar type (HCC) [F25.0] 06/19/2015    Total Time spent with patient: 45 minutes  Subjective:   Margaret Christian is a 50 y.o. female patient admitted with reports of disorientation with strange behavior, being awake for 4 days, rumination about finances also. Hx of suicide attempt. Orientation limited to self and place, not to situation. Pt denies suicidal/homicidal ideation and is laughing in appropriately intermittently although not observed at this time. Pt seen and chart reviewed by NP/MD team. Note written on behalf of Dr. . .  HPI:  I have reviewed and concur with HPI elements from ED, modified as follows:  Margaret Christian is an 50 y.o. female who was brought into the [facility] at the urging of her brother who was concerned at a recent change in his sister's behavior. Per pt record, pt's son who lives with her was concerned as well. Per pt record, pt's brother describes "burst of laughter" without any reason to laugh. Also, brother sts that pt is talking to herself and seems to be talking to someone else. Brother sts that pt is making paranoid comments. Pt was observed to exhibit all the previously mentioned behaviors during the assessment. Pt sts that for the last 4 days she has not been able to sleep due to "churning feelings" brought on she sts by financial concerns (student loan default in December 2016, possible loss of [employer] insurance and problems with her car.) Pt sts that about 4 days ago a work friend's BF died and she was very concerned about her friend. Pt denies SI, HI, SHI and AVH. However, pt had several bursts of sudden, inappropriate laughter. Pt made several  comments directed at this writer concerning her "real intentions" doing an assessment, intrusive/paranoid uses of the TA equipment (camera) and others at [facility] denying people jobs. Among other random statements pt suddenly stated abruptly, "Tell Miss O I have it and I will never let go of it." and " there is a camera in her room because [facility] does not want to hire people." When asked to elaborate or further explain her comments pt began talking to someone else in the room who was not there. Several times, pt stated, "yes....yes.... No this time" and other short answers as if in conversation and being directed by someone else.   Pt sts she lives with hier 15 yo son who she sts she is very proud of. Pt sts that her son's father is deceased. Pt sts she has a BA degree in Art, Economic and Politics. 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 Pt sts that she does not feel support but, rather judgement from her brother and sister-in-law. Pt sts her relationship with her mother "is improving" but pt sts she feels limited in developing their relationship further and her mother's relationship with her son because her mother living in [place].  Pt was dressed in scrubs and sitting on her hospital bed. Pt was alert, suspicious and irritable. As the assessment continued, pt seemed to become more comfortable as she spoke less sharply and less hesitantly. Pt kept good eye contact and commented if this writer ever dropped eye contact. Pt spoke in a clear tone and normal pace. Pt moved in a normal manner when   moving. Pt's thought process was coherent and relevant and judgement was impaired. Pt's mood was not depressed as pt denied all depression symptoms and their blunted affect was incongruent. Pt sts that she had a panic attack the day before but stated she was not currently anxious at all forming a zero with her hands to indicate 0 anxiety. Pt was oriented x 4, to person, place, time and situation.   Past  Psychiatric History: depression, psychosis  Risk to Self: Suicidal Ideation: No (denies) Suicidal Intent: No Is patient at risk for suicide?: No Suicidal Plan?: No Access to Means: No (denies) What has been your use of drugs/alcohol within the last 12 months?: occasional How many times?: 0 Other Self Harm Risks: none Triggers for Past Attempts:  (na) Intentional Self Injurious Behavior: None Risk to Others: Homicidal Ideation: No (denies) Thoughts of Harm to Others: No (denies) Current Homicidal Intent: No Current Homicidal Plan: No Access to Homicidal Means: No (denies) Identified Victim: none History of harm to others?: No (denies) Assessment of Violence: None Noted Does patient have access to weapons?: No (denies) Criminal Charges Pending?: No Does patient have a court date: No Prior Inpatient Therapy: Prior Inpatient Therapy: Yes Prior Therapy Dates: 2002 Prior Therapy Facilty/Provider(s): don't remember Reason for Treatment: don't remember Prior Outpatient Therapy: Prior Outpatient Therapy: No (denies) Prior Therapy Dates: na Prior Therapy Facilty/Provider(s): na Reason for Treatment: na Does patient have an ACCT team?: No Does patient have Intensive In-House Services?  : No Does patient have [facility] services? : No Does patient have P4CC services?: No  Past Medical History: History reviewed. No pertinent past medical history. History reviewed. No pertinent past surgical history. Family History: No family history on file. Family Psychiatric  History: unknown Social History:  History  Alcohol Use  . Yes     History  Drug Use Not on file    Social History   Social History  . Marital Status: Single    Spouse Name: N/A  . Number of Children: N/A  . Years of Education: N/A   Social History Main Topics  . Smoking status: Never Smoker   . Smokeless tobacco: None  . Alcohol Use: Yes  . Drug Use: None  . Sexual Activity: Not Asked   Other Topics Concern  .  None   Social History Narrative  . None   Additional Social History:    Prescriptions: See PTA list History of alcohol / drug use?: Yes Name of Substance 1: Alcohol 1 - Age of First Use: sip at father's knee of wine 1 - Amount (size/oz): 3/4 a glass of wine 1 - Frequency: 3 x year 1 - Last Use / Amount: New Year's Eve                   Allergies:   Allergies  Allergen Reactions  . Shellfish Allergy Nausea And Vomiting    Labs:  Results for orders placed or performed during the hospital encounter of 06/18/15 (from the past 48 hour(s))  Comprehensive metabolic panel     Status: Abnormal   Collection Time: 06/18/15  8:55 PM  Result Value Ref Range   Sodium 141 135 - 145 mmol/L   Potassium 3.1 (L) 3.5 - 5.1 mmol/L   Chloride 104 101 - 111 mmol/L   CO2 25 22 - 32 mmol/L   Glucose, Bld 122 (H) 65 - 99 mg/dL   BUN 17 6 - 20 mg/dL   Creatinine, Ser 1.11 (H) 0.44 - 1.00  mg/dL   Calcium 9.8 8.9 - 10.3 mg/dL   Total Protein 8.4 (H) 6.5 - 8.1 g/dL   Albumin 4.7 3.5 - 5.0 g/dL   AST 32 15 - 41 U/L   ALT 30 14 - 54 U/L   Alkaline Phosphatase 67 38 - 126 U/L   Total Bilirubin 0.7 0.3 - 1.2 mg/dL   GFR calc non Af Amer 58 (L) >60 mL/min   GFR calc Af Amer >60 >60 mL/min    Comment: (NOTE) The eGFR has been calculated using the CKD EPI equation. This calculation has not been validated in all clinical situations. eGFR's persistently <60 mL/min signify possible Chronic Kidney Disease.    Anion gap 12 5 - 15  Ethanol (ETOH)     Status: None   Collection Time: 06/18/15  8:55 PM  Result Value Ref Range   Alcohol, Ethyl (B) <5 <5 mg/dL    Comment:        LOWEST DETECTABLE LIMIT FOR SERUM ALCOHOL IS 5 mg/dL FOR MEDICAL PURPOSES ONLY   Salicylate level     Status: None   Collection Time: 06/18/15  8:55 PM  Result Value Ref Range   Salicylate Lvl <4.0 2.8 - 30.0 mg/dL  Acetaminophen level     Status: Abnormal   Collection Time: 06/18/15  8:55 PM  Result Value Ref Range    Acetaminophen (Tylenol), Serum <10 (L) 10 - 30 ug/mL    Comment:        THERAPEUTIC CONCENTRATIONS VARY SIGNIFICANTLY. A RANGE OF 10-30 ug/mL MAY BE AN EFFECTIVE CONCENTRATION FOR MANY PATIENTS. HOWEVER, SOME ARE BEST TREATED AT CONCENTRATIONS OUTSIDE THIS RANGE. ACETAMINOPHEN CONCENTRATIONS >150 ug/mL AT 4 HOURS AFTER INGESTION AND >50 ug/mL AT 12 HOURS AFTER INGESTION ARE OFTEN ASSOCIATED WITH TOXIC REACTIONS.   CBC     Status: Abnormal   Collection Time: 06/18/15  8:55 PM  Result Value Ref Range   WBC 14.0 (H) 4.0 - 10.5 K/uL   RBC 4.79 3.87 - 5.11 MIL/uL   Hemoglobin 13.0 12.0 - 15.0 g/dL   HCT 40.2 36.0 - 46.0 %   MCV 83.9 78.0 - 100.0 fL   MCH 27.1 26.0 - 34.0 pg   MCHC 32.3 30.0 - 36.0 g/dL   RDW 14.8 11.5 - 15.5 %   Platelets 399 150 - 400 K/uL  Urine rapid drug screen (hosp performed) (Not at [facility])     Status: None   Collection Time: 06/18/15  9:12 PM  Result Value Ref Range   Opiates NONE DETECTED NONE DETECTED   Cocaine NONE DETECTED NONE DETECTED   Benzodiazepines NONE DETECTED NONE DETECTED   Amphetamines NONE DETECTED NONE DETECTED   Tetrahydrocannabinol NONE DETECTED NONE DETECTED   Barbiturates NONE DETECTED NONE DETECTED    Comment:        DRUG SCREEN FOR MEDICAL PURPOSES ONLY.  IF CONFIRMATION IS NEEDED FOR ANY PURPOSE, NOTIFY LAB WITHIN 5 DAYS.        LOWEST DETECTABLE LIMITS FOR URINE DRUG SCREEN Drug Class       Cutoff (ng/mL) Amphetamine      1000 Barbiturate      200 Benzodiazepine   200 Tricyclics       300 Opiates          300 Cocaine          300 THC              50   Urinalysis, Routine w reflex microscopic     Status:   Abnormal   Collection Time: 06/18/15  9:12 PM  Result Value Ref Range   Color, Urine AMBER (A) YELLOW    Comment: BIOCHEMICALS MAY BE AFFECTED BY COLOR   APPearance TURBID (A) CLEAR   Specific Gravity, Urine 1.026 1.005 - 1.030   pH 6.0 5.0 - 8.0   Glucose, UA NEGATIVE NEGATIVE mg/dL   Hgb urine dipstick LARGE  (A) NEGATIVE   Bilirubin Urine MODERATE (A) NEGATIVE   Ketones, ur 15 (A) NEGATIVE mg/dL   Protein, ur 100 (A) NEGATIVE mg/dL   Nitrite NEGATIVE NEGATIVE   Leukocytes, UA TRACE (A) NEGATIVE  Urine microscopic-add on     Status: Abnormal   Collection Time: 06/18/15  9:12 PM  Result Value Ref Range   Squamous Epithelial / LPF TOO NUMEROUS TO COUNT (A) NONE SEEN   WBC, UA 0-5 0 - 5 WBC/hpf   RBC / HPF 6-30 0 - 5 RBC/hpf   Bacteria, UA MANY (A) NONE SEEN   Crystals CA OXALATE CRYSTALS (A) NEGATIVE   Urine-Other AMORPHOUS URATES/PHOSPHATES   TSH     Status: None   Collection Time: 06/18/15 10:22 PM  Result Value Ref Range   TSH 2.009 0.350 - 4.500 uIU/mL  Urinalysis, Routine w reflex microscopic     Status: Abnormal   Collection Time: 06/19/15  4:31 AM  Result Value Ref Range   Color, Urine YELLOW YELLOW   APPearance CLEAR CLEAR   Specific Gravity, Urine 1.021 1.005 - 1.030   pH 6.0 5.0 - 8.0   Glucose, UA NEGATIVE NEGATIVE mg/dL   Hgb urine dipstick TRACE (A) NEGATIVE   Bilirubin Urine NEGATIVE NEGATIVE   Ketones, ur 40 (A) NEGATIVE mg/dL   Protein, ur NEGATIVE NEGATIVE mg/dL   Nitrite NEGATIVE NEGATIVE   Leukocytes, UA NEGATIVE NEGATIVE  Urine microscopic-add on     Status: Abnormal   Collection Time: 06/19/15  4:31 AM  Result Value Ref Range   Squamous Epithelial / LPF 0-5 (A) NONE SEEN   WBC, UA 0-5 0 - 5 WBC/hpf   RBC / HPF 0-5 0 - 5 RBC/hpf   Bacteria, UA FEW (A) NONE SEEN    Current Facility-Administered Medications  Medication Dose Route Frequency Provider Last Rate Last Dose  . acetaminophen (TYLENOL) tablet 650 mg  650 mg Oral Q4H PRN Charlesetta Shanks, MD      . cloNIDine (CATAPRES) tablet 0.1 mg  0.1 mg Oral BID Charlesetta Shanks, MD   0.1 mg at 06/19/15 0721  . LORazepam (ATIVAN) tablet 1 mg  1 mg Oral Q8H PRN Charlesetta Shanks, MD      . ondansetron (ZOFRAN) tablet 4 mg  4 mg Oral Q8H PRN Charlesetta Shanks, MD      . zolpidem (AMBIEN) tablet 5 mg  5 mg Oral QHS PRN Charlesetta Shanks, MD       No current outpatient prescriptions on file.    Musculoskeletal: Strength & Muscle Tone: within normal limits Gait & Station: not observed, in bed Patient leans: N/A  Psychiatric Specialty Exam: Review of Systems  Psychiatric/Behavioral: Positive for depression. Negative for hallucinations (yet inappropriate emotional responses which may be consistent with internal stimuli). The patient is nervous/anxious.        Feels disoriented  All other systems reviewed and are negative.   Blood pressure 176/108, pulse 84, temperature 98.4 F (36.9 C), temperature source Oral, resp. rate 17, SpO2 95 %.There is no height or weight on file to calculate BMI.  General Appearance: Casual and Fairly  Groomed  Eye Contact::  Good  Speech:  Clear and Coherent and Normal Rate  Volume:  Normal  Mood:  Dysphoric and Hopeless  Affect:  Non-Congruent and Inappropriate  Thought Process:  Coherent  Orientation:  Other:  Self, place  Thought Content:  disoriented  Suicidal Thoughts:  No  Homicidal Thoughts:  No  Memory:  Immediate;   Fair Recent;   Fair Remote;   Fair  Judgement:  Impaired  Insight:  Lacking  Psychomotor Activity:  Decreased  Concentration:  Fair  Recall:  Fair  Fund of Knowledge:Fair  Language: Good  Akathisia:  No  Handed:    AIMS (if indicated):     Assets:  Desire for Improvement Resilience Social Support  ADL's:  Intact  Cognition: WNL  Sleep:      Treatment Plan Summary: Daily contact with patient to assess and evaluate symptoms and progress in treatment and Medication management  Disposition: Recommend psychiatric Inpatient admission when medically cleared.  Withrow, John C, FNP-BC 06/19/2015 12:32 PM Patient seen face-to-face for psychiatric evaluation, chart reviewed and case discussed with the physician extender and developed treatment plan. Reviewed the information documented and agree with the treatment plan.  , MD 

## 2015-06-19 NOTE — ED Notes (Signed)
Pt noted to be standing in doorway of room.  Pt informed that this RN would be in shortly and asked to go back into room.  Pt asked "where is Scott?"  When asked who Lorin Picket is, pt responded "your dad."

## 2015-06-19 NOTE — Progress Notes (Signed)
Patient has been discharged into sheriff custody for transfer to [facility]. Patient hesitant but complied with no issues.

## 2015-06-19 NOTE — ED Notes (Signed)
Breakfast tray was given. Nurse aware.  

## 2015-06-19 NOTE — ED Notes (Signed)
Patient has been confused and paranoid since admission.  She seems very forgetful.  Her blood pressure was elevated and she states she doesn't take pharmaceuticals because they cannot be trusted.  She will transfer to [facility] later this evening.  Report has been called and transport arranged.

## 2015-06-19 NOTE — ED Notes (Signed)
Patient refused to take clonidine at this time, states I'm not sure what this medications is and I am not taking it.

## 2015-06-19 NOTE — ED Notes (Signed)
Pt c/o "cold symptoms."  Sts "there is something going around the store."  Pt reports that she works at [employer].  This RN informed Pt that we are waiting on the Psych team to round and educated that her family was concerned about some recent behaviors.  Pt is concerned about getting back to her son.  Denies SI/HI/AV.  Flat affect noted.

## 2015-06-19 NOTE — ED Notes (Signed)
Pt refused CATAPRES.  Sts "that sounds like something that is going to hurt me and make my blood pressure go higher."  This RN attempted to reassure Pt.  However, she sts "I haven't talked to a doctor about this and you're only a RN."  Pt c/o "persistent" L upper abdominal pain x "years."  Sts she was previously seen by a PCP, but "did not complete the evaluation."  Pt reports that her PCP has "been trying to avoid me or something.  He kept rescheduling my appointments."  Further, Pt was informed that the Psych staff has recommended Inpatient treatment and that she was IVC'd by EDP last night.  Pt believes that this RN is lying and family took out the papers.  Sts she has not been seen by a doctor.

## 2015-06-20 ENCOUNTER — Inpatient Hospital Stay: Payer: No Typology Code available for payment source

## 2015-06-20 DIAGNOSIS — F29 Unspecified psychosis not due to a substance or known physiological condition: Secondary | ICD-10-CM

## 2015-06-20 DIAGNOSIS — I10 Essential (primary) hypertension: Secondary | ICD-10-CM

## 2015-06-20 DIAGNOSIS — I674 Hypertensive encephalopathy: Secondary | ICD-10-CM

## 2015-06-20 DIAGNOSIS — F209 Schizophrenia, unspecified: Secondary | ICD-10-CM | POA: Insufficient documentation

## 2015-06-20 DIAGNOSIS — E876 Hypokalemia: Secondary | ICD-10-CM

## 2015-06-20 LAB — BASIC METABOLIC PANEL
Anion gap: 10 (ref 5–15)
BUN: 20 mg/dL (ref 6–20)
CALCIUM: 9 mg/dL (ref 8.9–10.3)
CO2: 25 mmol/L (ref 22–32)
CREATININE: 1.09 mg/dL — AB (ref 0.44–1.00)
Chloride: 98 mmol/L — ABNORMAL LOW (ref 101–111)
GFR calc Af Amer: >60 mL/min (ref >60)
GFR, EST NON AFRICAN AMERICAN: 59 mL/min — AB (ref >60)
Glucose, Bld: 109 mg/dL — ABNORMAL HIGH (ref 65–99)
POTASSIUM: 3.2 mmol/L — AB (ref 3.5–5.1)
SODIUM: 133 mmol/L — AB (ref 135–145)

## 2015-06-20 LAB — URINE CULTURE: Culture: 1000

## 2015-06-20 LAB — TROPONIN I

## 2015-06-20 LAB — LIPID PANEL
Cholesterol: 201 mg/dL — ABNORMAL HIGH (ref 0–200)
HDL: 26 mg/dL — ABNORMAL LOW (ref >40)
LDL Cholesterol: 141 mg/dL — ABNORMAL HIGH (ref 0–99)
Total CHOL/HDL Ratio: 7.7 RATIO
Triglycerides: 168 mg/dL — ABNORMAL HIGH (ref <150)
VLDL: 34 mg/dL (ref 0–40)

## 2015-06-20 LAB — TSH: TSH: 1.922 u[IU]/mL (ref 0.350–4.500)

## 2015-06-20 LAB — HEMOGLOBIN A1C: HEMOGLOBIN A1C: 5.4 % (ref 4.0–6.0)

## 2015-06-20 MED ORDER — ARIPIPRAZOLE 10 MG PO TABS
20.0000 mg | ORAL_TABLET | Freq: Every day | ORAL | Status: DC
  Administered 2015-06-20 – 2015-06-25 (×6): 20 mg via ORAL
  Filled 2015-06-20 (×6): qty 2

## 2015-06-20 MED ORDER — POTASSIUM CHLORIDE CRYS ER 10 MEQ PO TBCR
40.0000 meq | EXTENDED_RELEASE_TABLET | Freq: Every day | ORAL | Status: AC
  Administered 2015-06-21: 40 meq via ORAL
  Filled 2015-06-20 (×2): qty 4

## 2015-06-20 MED ORDER — LISINOPRIL 20 MG PO TABS
40.0000 mg | ORAL_TABLET | Freq: Every day | ORAL | Status: DC
  Administered 2015-06-21: 40 mg via ORAL
  Filled 2015-06-20 (×2): qty 2

## 2015-06-20 MED ORDER — CLONIDINE HCL 0.1 MG PO TABS
0.1000 mg | ORAL_TABLET | Freq: Every day | ORAL | Status: DC | PRN
  Administered 2015-06-20: 0.1 mg via ORAL
  Filled 2015-06-20: qty 1

## 2015-06-20 MED ORDER — HYDROCHLOROTHIAZIDE 25 MG PO TABS
25.0000 mg | ORAL_TABLET | Freq: Every day | ORAL | Status: DC
  Administered 2015-06-21: 25 mg via ORAL
  Filled 2015-06-20 (×2): qty 1

## 2015-06-20 NOTE — H&P (Addendum)
Psychiatric Admission Assessment Adult  Patient Identification: Margaret Christian MRN:  [id number] Date of Evaluation:  06/20/2015 Chief Complaint: Psychosis  Principal Diagnosis: Schizophrenia ([place]) Diagnosis:   Patient Active Problem List   Diagnosis Date Noted  . Encephalopathy, hypertensive [I67.4] 06/20/2015  . 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 Hypokalemia [E87.6] 06/20/2015  . Schizophrenia ([place]) [F20.9] 06/20/2015   History of Present Illness:  Margaret Christian is a 50 y.o. female who was brought into the [facility] at the urging of her brother who was concerned at a recent change in his sister's behavior. Per pt record, pt's brother describes "burst of laughter" without any reason to laugh. Patient also has been interacting to internal stimuli and is seen by family talking to people that aren't there. She has been making paranoid comments.   In the ER patient patient displayed inappropriate laughter, she appeared paranoid about the computers in the cameras and was interacting to internal stimuli.  Patient was a poor historian.  Patient says 4 days ago she started feeling bad.  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 She says she has all her stressors such as having about relationship with her mother. Then she started crying and said that her father passed away after she graduated from college.  During assessment today the patient's answers were tangential and in some occasions were not related at all to the questions. During the assessment the patient was paranoid and made sarcastic comments such as "I smell a rat in here".  Per nursing she was very paranoid this morning and was argumentative about her medications. The patient was prescribed clonidine for blood pressure. Patient refused to take the clonidine insisting that he was Klonopin despite seeing their package.  Patient was showing the package  but the patient insisted that he was not clonidine but instead Klonopin.  I saw one of the ER notes that the patient's brother have reported she had a remote history of schizophrenia. However she has not been on any treatment for years has been highly functioning. It is very unlikely that the patient suffers from schizophrenia.  Collateral info was obtained from the brother: pt was diagnosed with schizophrenia 16 or 18 years ago. She has been hospitalized a multitude of times including  the state facility in [place].  The patient has not had any treatment or follow-up in about 10 years. During one of her hospitalizations the patient got involved with a female patient then ended up having a child who is now 60 years old.  Patient lives with her son. 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  The patient's brother was alerted of the patient's decompensation by the patient's 55 year old son. The son reported that the patient was laughing and yelling, she was trying to attack the dog. She was not sleeping and for 4 days she didn't sleep at all, she was not eating. She was constantly looking out of the window.    Associated Signs/Symptoms: Depression Symptoms:  denies (Hypo) Manic Symptoms:  Labiality of Mood, Anxiety Symptoms:  Excessive Worry, Psychotic Symptoms:  Paranoia, PTSD Symptoms: NA Total Time spent with patient: 1 hour  Past Psychiatric History: Prior history of schizophrenia, multiple psychiatric hospitalizations. 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 History  Alcohol Use  . 0.0 oz/week  . 0 Standard drinks or equivalent per week     History  Drug Use No    Social History   Social History  . Marital Status: Single    Spouse Name: N/A  . Number of Children: N/A  . Years of Education: N/A   Social History Main Topics  . Smoking status: Never Smoker   . Smokeless tobacco: None  . Alcohol Use: 0.0 oz/week    0 Standard drinks or equivalent per week  . Drug Use: No  . Sexual Activity: Not Currently    Birth Control/ Protection: None   Other Topics Concern  . None   Social History Narrative    Allergies:   Allergies  Allergen Reactions  . Shellfish Allergy Nausea And Vomiting   Lab Results:  Results for orders placed or performed during the hospital encounter of 06/19/15 (from the past 48 hour(s))  Basic metabolic panel     Status: Abnormal   Collection Time: 06/20/15  8:11 AM  Result Value Ref Range   Sodium 133 (L) 135 - 145 mmol/L   Potassium 3.2 (L) 3.5 - 5.1 mmol/L   Chloride 98 (L) 101 - 111 mmol/L   CO2 25 22 - 32 mmol/L   Glucose, Bld 109 (H) 65 - 99 mg/dL   BUN 20 6 - 20 mg/dL   Creatinine, Ser 1.09 (H) 0.44 - 1.00 mg/dL   Calcium 9.0 8.9 - 10.3 mg/dL   GFR calc non Af Amer 59 (L) >60 mL/min   GFR calc Af Amer >60 >60 mL/min    Comment: (NOTE) The eGFR has been calculated using the CKD EPI equation. This calculation has not been validated in all clinical situations. eGFR's persistently <60 mL/min signify possible Chronic Kidney Disease.    Anion gap 10 5 - 15  Hemoglobin A1c     Status: None   Collection Time: 06/20/15  8:11 AM  Result Value Ref Range   Hgb A1c MFr Bld 5.4 4.0 - 6.0 %  Lipid panel, fasting     Status: Abnormal   Collection Time: 06/20/15  8:11 AM  Result Value Ref Range   Cholesterol 201 (H) 0 - 200 mg/dL   Triglycerides 168 (H) <150 mg/dL   HDL 26 (L) >40 mg/dL   Total CHOL/HDL Ratio 7.7 RATIO   VLDL 34 0 - 40 mg/dL   LDL  Cholesterol 141 (H) 0 - 99 mg/dL    Comment:        Total Cholesterol/HDL:CHD Risk Coronary Heart Disease Risk Table                     Men   Women  1/2 Average Risk   3.4   3.3  Average Risk       5.0   4.4  2 X Average Risk   9.6   7.1  3 X Average Risk  23.4   11.0        Use the calculated Patient Ratio above and the CHD Risk Table to determine the patient's CHD Risk.  ATP III CLASSIFICATION (LDL):  <100     mg/dL   Optimal  100-129  mg/dL   Near or Above                    Optimal  130-159  mg/dL   Borderline  160-189  mg/dL   High  >190     mg/dL   Very High   TSH     Status: None   Collection Time: 06/20/15  8:11 AM  Result Value Ref Range   TSH 1.922 0.350 - 4.500 uIU/mL  Troponin I (q 6hr x 3)     Status: None   Collection Time: 06/20/15 12:47 PM  Result Value Ref Range   Troponin I <0.03 <0.031 ng/mL    Comment:        NO INDICATION OF MYOCARDIAL INJURY.     Metabolic Disorder Labs:  Lab Results  Component Value Date   HGBA1C 5.4 06/20/2015   No results found for: PROLACTIN Lab Results  Component Value Date   CHOL 201* 06/20/2015   TRIG 168* 06/20/2015   HDL 26* 06/20/2015   CHOLHDL 7.7 06/20/2015   VLDL 34 06/20/2015   LDLCALC 141* 06/20/2015    Current Medications: Current Facility-Administered Medications  Medication Dose Route Frequency Provider Last Rate Last Dose  . acetaminophen (TYLENOL) tablet 650 mg  650 mg Oral Q6H PRN Hildred Priest, MD      . alum & mag hydroxide-simeth (MAALOX/MYLANTA) 200-200-20 MG/5ML suspension 30 mL  30 mL Oral Q4H PRN Hildred Priest, MD      . cloNIDine (CATAPRES) tablet 0.1 mg  0.1 mg Oral Daily PRN Srikar Sudini, MD      . hydrochlorothiazide (HYDRODIURIL) tablet 25 mg  25 mg Oral Daily Srikar Sudini, MD      . lisinopril (PRINIVIL,ZESTRIL) tablet 40 mg  40 mg Oral Daily Srikar Sudini, MD      . LORazepam (ATIVAN) tablet 1 mg  1 mg Oral Q8H PRN Hildred Priest, MD   1 mg  at 06/19/15 2211  . magnesium hydroxide (MILK OF MAGNESIA) suspension 30 mL  30 mL Oral Daily PRN Hildred Priest, MD      . potassium chloride (K-DUR,KLOR-CON) CR tablet 40 mEq  40 mEq Oral Daily Hildred Priest, MD      . zolpidem (AMBIEN) tablet 5 mg  5 mg Oral QHS PRN Hildred Priest, MD   5 mg at 06/19/15 2211   PTA Medications: No prescriptions prior to admission    Musculoskeletal: Strength & Muscle Tone: within normal limits Gait & Station: normal Patient leans: N/A  Psychiatric Specialty Exam: Physical Exam  Constitutional: She is oriented to person, place, and time. She appears well-developed and well-nourished.  HENT:  Head: Normocephalic and atraumatic.  Eyes: Conjunctivae and EOM are normal.  Neck: Normal range of motion. Neck supple.  Respiratory: Effort normal and breath sounds normal.  Musculoskeletal: Normal range of motion.  Neurological: She is alert and oriented to person, place, and time.  Skin: Skin is warm and dry.    Review of Systems  Constitutional: Negative.   HENT: Negative.   Respiratory: Negative.   Cardiovascular: Negative.   Gastrointestinal: Negative.   Genitourinary: Negative.   Musculoskeletal: Negative.   Skin: Negative.   Neurological: Negative.   Endo/Heme/Allergies: Negative.   Psychiatric/Behavioral: Negative.     Blood pressure 148/112, pulse 88, temperature 98.7 F (37.1 C), temperature source Oral, resp. rate 20, height _0  (1.626 m), weight 90.719 kg (  200 lb).Body mass index is 34.31 kg/(m^2).  General Appearance: Fairly Groomed  Engineer, water::  Fair  Speech:  Normal Rate  Volume:  Normal  Mood:  Irritable  Affect:  Labile  Thought Process:  Loose and Tangential  Orientation:  Full (Time, Place, and Person)  Thought Content:  Paranoid Ideation  Suicidal Thoughts:  No  Homicidal Thoughts:  No  Memory:  Immediate;   Poor Recent;   Poor Remote;   Poor  Judgement:  Impaired  Insight:   Lacking  Psychomotor Activity:  Normal  Concentration:  Poor  Recall:  Poor  Fund of Knowledge:NA  Language: Good  Akathisia:  No  Handed:    AIMS (if indicated):     Assets:  Communication Skills Housing Social Support  ADL's:  Intact  Cognition: WNL  Sleep:  Number of Hours: 6.15     Treatment Plan Summary: Daily contact with patient to assess and evaluate symptoms and progress in treatment and Medication management   Schizophrenia: Patient does not want to take any antipsychotic that increases the risk for diabetes, hypertension or dyslipidemia. She was interested in either trying Abilify or Geodon as she requested and appearing out with side effect list.  Nurses have given this to her.  I suspect patient will need nonemergency forced medications.   Hypertension: She has been started on clonidine 0.1 mg by mouth by mouth twice a day.  Her blood pressure continues to be elevated today. Most recent blood pressure is 148/112.  Patient claims she was never diagnosed with hypertension prior to admission.  The clonidine this morning around 11 with significant encouragement from me and nursing.  Per nursing patient complained of chest pain: Troponins have been ordered, EKG has been ordered. I contacted the hospitalist.  Hypokalemia: I will order K Dur 40 mEq for 2 days  Hyponatremia: Sodium 133.  Chronic abdominal pain: Patient complains of upper left quadrant chronic abdominal pain and is requesting an ultrasound.  Will complete ultrasound tomorrow a.m.  Insomnia: Continue Ambien 5 mg by mouth daily at bedtime when necessary  Anxiety and agitation: Continue Ativan 1 mg every 6 hours as needed  Diet low sodium  Precautions every 15 minute checks  Vital signs every 8 hours  Consults: Will consult the hospitalist for management of hypertension.  EKG: Will be completed now  Labs: Troponins have been ordered  Head CT completed at [facility] and negative  Chest xray  completed at [facility] negative  Discharge disposition: Home once a stable  Discharge follow-up: To be determined  Collateral information: We'll need to obtain collateral information from family.   I certify that inpatient services furnished can reasonably be expected to improve the patient's condition.   Hildred Priest 1/20/20173:20 PM

## 2015-06-20 NOTE — Progress Notes (Signed)
Recreation Therapy Notes  At approximately 11:35 am, LRT spoke with patient's nurse regarding patient's assessment. Patient's nurse reported patient was not appropriate for assessment at this time.  Jacquelynn Cree, LRT/CTRS 06/20/2015 12:15 PM

## 2015-06-20 NOTE — BHH Group Notes (Signed)
BHH Group Notes:  (Nursing/MHT/Case Management/Adjunct)  Date:  06/20/2015  Time:  12:34 PM  Type of Therapy:  Psychoeducational Skills  Participation Level:  Did Not Attend  Benno Brensinger De'Chelle Jalesha Plotz 06/20/2015, 12:34 PM 

## 2015-06-20 NOTE — Plan of Care (Signed)
Problem: Alteration in thought process Goal: LTG-Patient has not harmed self or others in at least 2 days Outcome: Progressing Patient has not hurt self  overnight.

## 2015-06-20 NOTE — Progress Notes (Signed)
Recreation Therapy Notes  Date: 01.20.17 Time: 3:00 pm Location: Craft Room  Group Topic: Coping Skills  Goal Area(s) Addresses:  Patient will participate in healthy coping skill.  Patient will verbalize one emotion experienced in group.  Behavioral Response: Did not attend  Intervention: Coloring  Activity: Patients were given coloring sheets and instructed to color.  Education: LRT educated patients to color.  Education Outcome: Patient did not attend group.   Clinical Observations/Feedback: Patient did not attend group.  Jacquelynn Cree, LRT/CTRS 06/20/2015 4:34 PM

## 2015-06-20 NOTE — Consult Note (Signed)
[facility] [place] at [facility]   PATIENT NAME: Margaret Christian    MR#:  [id number]  DATE OF BIRTH:  May 23, 1966  DATE OF ADMISSION:  06/19/2015  PRIMARY CARE PHYSICIAN: No PCP Per Patient   CONSULT REQUESTING/REFERRING PHYSICIAN: Dr. Ardyth Harps  REASON FOR CONSULT: Accelerated hypertension  CHIEF COMPLAINT:  No chief complaint on file.   HISTORY OF PRESENT ILLNESS:  Margaret Christian  is a 50 y.o. female with a known history of untreated hypertension, possible schizophrenia admitted to behavioral health unit for psychosis. Patient has been noticed to have significantly elevated blood pressure as high as 184/110. Started on clonidine 0.1 twice a day with which her blood pressure is mildly improved. Patient seems to have had elevated blood pressure in the past one week and she checked her blood pressure at pharmacy. She did see Dr. Artist Pais who she did not like and would not like to follow-up with as he did not address her chronic abdominal pain. Patient is reluctant to start any new medications. No hematuria. No edema. No heart disease or strokes.  PAST MEDICAL HISTORY:   Past Medical History  Diagnosis Date  . Paranoid schizophrenia ([facility])    Untreated HTN  PAST SURGICAL HISTOIRY:  History reviewed. No pertinent past surgical history.  SOCIAL HISTORY:   Social History  Substance Use Topics  . Smoking status: Never Smoker   . Smokeless tobacco: Not on file  . Alcohol Use: 0.0 oz/week    0 Standard drinks or equivalent per week    FAMILY HISTORY:  History reviewed. No pertinent family history.  HTN in multiple family members  DRUG ALLERGIES:   Allergies  Allergen Reactions  . Shellfish Allergy Nausea And Vomiting    REVIEW OF SYSTEMS:   ROS  CONSTITUTIONAL: No fever, fatigue or weakness.  EYES: No blurred or double vision.  EARS, NOSE, AND THROAT: No tinnitus or ear pain.  RESPIRATORY: No cough, shortness of breath, wheezing or  hemoptysis.  CARDIOVASCULAR: No chest pain, orthopnea, edema.  GASTROINTESTINAL: No nausea, vomiting, diarrhea or abdominal pain.  GENITOURINARY: No dysuria, hematuria.  ENDOCRINE: No polyuria, nocturia,  HEMATOLOGY: No anemia, easy bruising or bleeding SKIN: No rash or lesion. MUSCULOSKELETAL: No joint pain or arthritis.   NEUROLOGIC: No tingling, numbness, weakness.  PSYCHIATRY: Anxious  MEDICATIONS AT HOME:   Prior to Admission medications   Not on File      VITAL SIGNS:  Blood pressure 148/112, pulse 88, temperature 98.7 F (37.1 C), temperature source Oral, resp. rate 20, height  (1.626 m), weight 90.719 kg (200 lb).  PHYSICAL EXAMINATION:  GENERAL:  50 y.o.-year-old patient lying in the bed with no acute distress.  EYES: Pupils equal, round, reactive to light and accommodation. No scleral icterus. Extraocular muscles intact.  HEENT: Head atraumatic, normocephalic. Oropharynx and nasopharynx clear.  NECK:  Supple, no jugular venous distention. No thyroid enlargement, no tenderness.  LUNGS: Normal breath sounds bilaterally, no wheezing, rales,rhonchi or crepitation. No use of accessory muscles of respiration.  CARDIOVASCULAR: S1, S2 normal. No murmurs, rubs, or gallops.  ABDOMEN: Soft, nontender, nondistended. Bowel sounds present. No organomegaly or mass.  EXTREMITIES: No pedal edema, cyanosis, or clubbing.  NEUROLOGIC: Cranial nerves II through XII are intact. Muscle strength 5/5 in all extremities. Sensation intact. Gait not checked.  PSYCHIATRIC: The patient is alert and awake. Flat affect SKIN: No obvious rash, lesion, or ulcer.   LABORATORY PANEL:   CBC  Recent Labs Lab 06/18/15 2055  WBC 14.0*  HGB 13.0  HCT 40.2  PLT 399   ------------------------------------------------------------------------------------------------------------------  Chemistries   Recent Labs Lab 06/18/15 2055 06/20/15 0811  NA 141 133*  K 3.1* 3.2*  CL 104 98*  CO2 25 25   GLUCOSE 122* 109*  BUN 17 20  CREATININE 1.11* 1.09*  CALCIUM 9.8 9.0  AST 32  --   ALT 30  --   ALKPHOS 67  --   BILITOT 0.7  --    ------------------------------------------------------------------------------------------------------------------  Cardiac Enzymes  Recent Labs Lab 06/20/15 1247  TROPONINI <0.03   ------------------------------------------------------------------------------------------------------------------  RADIOLOGY:  Dg Chest 2 View  06/18/2015  CLINICAL DATA:  50 year old female with altered mental status and cough. EXAM: CHEST  2 VIEW COMPARISON:  None. FINDINGS: The heart size and mediastinal contours are within normal limits. Both lungs are clear. The visualized skeletal structures are unremarkable. IMPRESSION: No active cardiopulmonary disease. Electronically Signed   By: Elgie Collard M.D.   On: 06/18/2015 21:55   Ct Head Wo Contrast  06/19/2015  CLINICAL DATA:  50 year old female with mental status change EXAM: CT HEAD WITHOUT CONTRAST TECHNIQUE: Contiguous axial images were obtained from the base of the skull through the vertex without intravenous contrast. COMPARISON:  None. FINDINGS: The ventricles and the sulci are appropriate in size for the patient's age. There is no intracranial hemorrhage. No midline shift or mass effect identified. The gray-white matter differentiation is preserved. The visualized paranasal sinuses and mastoid air cells are well aerated. The calvarium is intact. IMPRESSION: No acute intracranial pathology. Electronically Signed   By: Elgie Collard M.D.   On: 06/19/2015 00:16    EKG:   Orders placed or performed during the hospital encounter of 06/19/15  . EKG 12-Lead  . EKG 12-Lead    IMPRESSION AND PLAN:   * Accelerated hypertension Patient has had elevated blood pressure in the past. She tells me she noticed high blood pressure in the range of systolic of 180s when she checked her blood pressure at pharmacies in  the past. Patient has been started on clonidine which seems to have decreased her blood pressure. Due to rebound hypertension problems and side effect profile will start patient on lisinopril and hydrochlorothiazide. Will change clonidine to as needed. Patient seems extremely reluctant to start any new medications. I tried explaining to her the importance of medications to control blood pressure. Also requested nursing staff to print patient information for lisinopril and hydrochlorothiazide.  * Chronic left upper quadrant pain Ultrasound abdomen has already been ordered.  * Psychosis/possible paranoid schizophrenia Management as per psychiatry  * Hyperlipidemia LDL at 141. Will need lifestyle changes at this point. She is reluctant to start medications. She was introduced one medicine at a time and statin can be introduced as outpatient after repeat fasting lipid profile.    All the records are reviewed and case discussed with Consulting provider. Management plans discussed with the patient, family and they are in agreement.   TOTAL TIME TAKING CARE OF THIS PATIENT: 35 minutes.    Milagros Loll R M.D on 06/20/2015 at 3:07 PM  Between 7am to 6pm - Pager - [phone]  After 6pm go to [email or web address] - password [email or web address]  [facility]  Office  [phone]  CC: Primary care Physician: No PCP Per Patient     Note: This dictation was prepared with Dragon dictation along with smaller phrase technology. Any transcriptional errors that result from this process are unintentional.

## 2015-06-20 NOTE — BHH Suicide Risk Assessment (Signed)
[facility] Admission Suicide Risk Assessment   Nursing information obtained from:  Review of record Demographic factors:  Caucasian, Adolescent or young adult Current Mental Status:  NA Loss Factors:  NA Historical Factors:  Family history of mental illness or substance abuse Risk Reduction Factors:  NA  Total Time spent with patient: 1 hour Principal Problem: Psychosis Diagnosis:   Patient Active Problem List   Diagnosis Date Noted  . Encephalopathy, hypertensive [I67.4] 06/20/2015  . 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 Hypokalemia [E87.6] 06/20/2015   Subjective Data:   Continued Clinical Symptoms:    The "Alcohol Use Disorders Identification Test", Guidelines for Use in Primary Care, Second Edition.  World Science writer [facility]). Score between 0-7:  no or low risk or alcohol related problems. Score between 8-15:  moderate risk of alcohol related problems. Score between 16-19:  high risk of alcohol related problems. Score 20 or above:  warrants further diagnostic evaluation for alcohol dependence and treatment.   CLINICAL FACTORS:   Currently Psychotic Medical Diagnoses and Treatments/Surgeries   Psychiatric Specialty Exam: ROS   COGNITIVE FEATURES THAT CONTRIBUTE TO RISK:  Polarized thinking    SUICIDE RISK:   Moderate:  Frequent suicidal ideation with limited intensity, and duration, some specificity in terms of plans, no associated intent, good self-control, limited dysphoria/symptomatology, some risk factors present, and identifiable protective factors, including available and accessible social support.  PLAN OF CARE: admit to [facility]  I certify that inpatient services furnished can reasonably be expected to improve the patient's condition.   Jimmy Footman, MD 06/20/2015, 12:16 PM

## 2015-06-20 NOTE — Progress Notes (Signed)
Patient is intrusive and argumentative.  Suspicious and paranoid.  Reluctant to take medications because thinks you are given her something different that what you are saying.  When informed that I was giving her clonidine for her blood pressure, pt stated "that's not for blood pressure."  Even when allowed to see packaging did not believe that it was not klonopin.   Information sheets on clonodine given.  Dr. Venia Minks had to be present for patient to take clonodine.   Patient refused to have her abdominal ultrasound performed.  Dr. Ardyth Harps informed.   Unable to complete admission process due to patients pyschosis.

## 2015-06-20 NOTE — Progress Notes (Signed)
Admission Note:  50 yr female who presents IVC, for the treatment of Depression, insomnia, and altered thought process.  Pt appears flat and depressed. Pt was irritable, angry and hostile towards staff. Patient was noted to be responding to internal stimuli; hallucinating, suspicious and paranoia  of the unit. Patient's thoughts are disorganized, incoherent and illogical. Spech is tangential with word salad. Patient was not cooperative with admission process. Pt denies SI/HI  upon admission.  Pt has Past medical Hx of Depression and Suicidal attempt. Skin was assessed and found to be clear of any abnormal marks.  PT searched and no contraband found, POC and unit policies explained and understanding verbalized. Food and fluids offered, and it was accepted. 15 minutes checks maintained will continue to closely monitor.

## 2015-06-20 NOTE — Tx Team (Signed)
Initial Interdisciplinary Treatment Plan   PATIENT STRESSORS: Financial difficulties Marital or family conflict Occupational concerns   PATIENT STRENGTHS: Capable of independent living Supportive family/friends   PROBLEM LIST: Problem List/Patient Goals Date to be addressed Date deferred Reason deferred Estimated date of resolution  Depression 06/19/15           Psychosis  06/19/15           Insomnia  06/19/15                              DISCHARGE CRITERIA:  Improved stabilization in mood, thinking, and/or behavior Motivation to continue treatment in a less acute level of care Reduction of life-threatening or endangering symptoms to within safe limits  PRELIMINARY DISCHARGE PLAN: Outpatient therapy  PATIENT/FAMIILY INVOLVEMENT: This treatment plan has been presented to and reviewed with the patient, Margaret Christian, The patient and family have been given the opportunity to ask questions and make suggestions.  Margaret Christian [name] [name] 06/20/2015, 5:19 AM
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
## 2015-06-20 NOTE — Plan of Care (Signed)
Problem: Alteration in thought process Goal: LTG-Patient behavior demonstrates decreased signs psychosis (Patient behavior demonstrates decreased signs of psychosis to the point the patient is safe to return home and continue treatment in an outpatient setting.)  Outcome: Not Progressing Patient is paranoid and argumentative.  Noted to be responding to some internal stimuli. Noted having conversations with unseen person.

## 2015-06-21 DIAGNOSIS — F200 Paranoid schizophrenia: Secondary | ICD-10-CM

## 2015-06-21 LAB — BASIC METABOLIC PANEL
ANION GAP: 7 (ref 5–15)
BUN: 18 mg/dL (ref 6–20)
CHLORIDE: 102 mmol/L (ref 101–111)
CO2: 29 mmol/L (ref 22–32)
Calcium: 9 mg/dL (ref 8.9–10.3)
Creatinine, Ser: 0.84 mg/dL (ref 0.44–1.00)
GFR calc Af Amer: >60 mL/min (ref >60)
Glucose, Bld: 113 mg/dL — ABNORMAL HIGH (ref 65–99)
POTASSIUM: 3.5 mmol/L (ref 3.5–5.1)
SODIUM: 138 mmol/L (ref 135–145)

## 2015-06-21 LAB — PROLACTIN: PROLACTIN: 31.2 ng/mL — AB (ref 4.8–23.3)

## 2015-06-21 LAB — TSH: TSH: 1.258 u[IU]/mL (ref 0.350–4.500)

## 2015-06-21 NOTE — Progress Notes (Signed)
[facility] - [place] [facility]   PATIENT NAME: Margaret Christian    MR#:  [id number]  DATE OF BIRTH:  05/01/1966  SUBJECTIVE:    Patient voices no complaints. Blood pressures improved.  REVIEW OF SYSTEMS:    Review of Systems  Constitutional: Negative for fever, chills and malaise/fatigue.  HENT: Negative for sore throat.   Eyes: Negative for blurred vision.  Respiratory: Negative for cough, hemoptysis, shortness of breath and wheezing.   Cardiovascular: Negative for chest pain, palpitations and leg swelling.  Gastrointestinal: Negative for nausea, vomiting, abdominal pain, diarrhea and blood in stool.  Genitourinary: Negative for dysuria.  Musculoskeletal: Negative for back pain.  Neurological: Negative for dizziness, tremors and headaches.  Endo/Heme/Allergies: Does not bruise/bleed easily.    Tolerating Diet: yes      DRUG ALLERGIES:   Allergies  Allergen Reactions  . Shellfish Allergy Nausea And Vomiting    VITALS:  Blood pressure 133/95, pulse 80, temperature 98 F (36.7 C), temperature source Oral, resp. rate 20, height 5\' 4"  (1.626 m), weight 90.719 kg (200 lb).  PHYSICAL EXAMINATION:   Physical Exam  Constitutional: She is oriented to person, place, and time and well-developed, well-nourished, and in no distress. No distress.  HENT:  Head: Normocephalic.  Eyes: No scleral icterus.  Neck: Normal range of motion. Neck supple. No JVD present. No tracheal deviation present.  Cardiovascular: Normal rate, regular rhythm and normal heart sounds.  Exam reveals no gallop and no friction rub.   No murmur heard. Pulmonary/Chest: Effort normal and breath sounds normal. No respiratory distress. She has no wheezes. She has no rales. She exhibits no tenderness.  Abdominal: Soft. Bowel sounds are normal. She exhibits no distension and no mass. There is no tenderness. There is no rebound and no guarding.  Musculoskeletal: Normal range of  motion. She exhibits no edema.  Neurological: She is alert and oriented to person, place, and time.  Skin: Skin is warm. No rash noted. No erythema.  Psychiatric: Affect and judgment normal.      LABORATORY PANEL:   CBC  Recent Labs Lab 06/18/15 2055  WBC 14.0*  HGB 13.0  HCT 40.2  PLT 399   ------------------------------------------------------------------------------------------------------------------  Chemistries   Recent Labs Lab 06/18/15 2055 06/20/15 0811  NA 141 133*  K 3.1* 3.2*  CL 104 98*  CO2 25 25  GLUCOSE 122* 109*  BUN 17 20  CREATININE 1.11* 1.09*  CALCIUM 9.8 9.0  AST 32  --   ALT 30  --   ALKPHOS 67  --   BILITOT 0.7  --    ------------------------------------------------------------------------------------------------------------------  Cardiac Enzymes  Recent Labs Lab 06/20/15 1247  TROPONINI <0.03   ------------------------------------------------------------------------------------------------------------------  RADIOLOGY:  No results found.   ASSESSMENT AND PLAN:   50 year old female with a history of untreated hypertension and possible schizophrenia who is admitted to the behavioral unit for psychosis. Hospitalist was consulted for elevation in blood pressure.  1. Accelerated hypertension: Patient's blood pressure seems to have responded well to HCTZ and lisinopril. Continue these medications and follow BMP.  2. Hyponatremia: I have repeated labs for this morning with these are still pending. Patient continues a persistent hyponatremia and then she would not benefit from HCTZ as this can cause hyponatremia. I may need to add a beta blocker or Norvasc.  3. Chronic left upper quadrant abdominal pain: It looks like this was ordered but then discontinue. Patient is not complaining of abdominal pain this time. I will reorder ultrasound.  4. Hypokalemia: This was repleted and to be rechecked today.  5. Schizophrenia: And is read as  per psychiatry.    Management plans discussed with the patient and she is in agreement.  CODE STATUS: full  TOTAL TIME TAKING CARE OF THIS PATIENT: 30 minutes.   Thank you for allowing me to person in the care of the patient. I will continue to follow.  POSSIBLE D/C ??, DEPENDING ON CLINICAL CONDITION.   Rashonda Warrior M.D on 06/21/2015 at 10:35 AM  Between 7am to 6pm - Pager - [phone] After 6pm go to [email or web address] - password [email or web address] [facility]  [facility]  Office  [phone]  CC: Primary care physician; No PCP Per Patient  Note: This dictation was prepared with Dragon dictation along with smaller phrase technology. Any transcriptional errors that result from this process are unintentional.

## 2015-06-21 NOTE — Progress Notes (Signed)
D: Pt denies SI/HI/AVH, but noted responding to internal stimuli, mood is angry, sad irritable and guarded, affect is blunted, thoughts are disorganized, speech is tangential and illogical at times. Patient is not interacting with peers and staff appropriately.  A: Pt was offered support and encouragement. Pt was given scheduled medications. Pt was encouraged to attend groups. Q 15 minute checks were done for safety.  R:Pt attends groups and does not  interacts well with peers and staff. Pt is taking medication. Pt is not receptive to treatment and safety maintained on unit.

## 2015-06-21 NOTE — BHH Group Notes (Signed)
[facility] LCSW Aftercare Discharge Planning Group Note   06/21/2015 10:53 AM (late entry 06/20/15)   Participation Quality: Minimal   Mood/Affect:  Defensive  Depression Rating:  Unable to rate   Anxiety Rating:  Unable to rate   Thoughts of Suicide:  No Will you contract for safety?   NA  Current AVH:  responding to internal stimuli   Plan for Discharge/Comments:  Pt plans to return home and follow up with outpatient.  Pt appeared to be responding to internal stimuli throughout group. Towards the end of group, she stated she did not like the interaction between a patient and the social work Tax inspector. She told the patient "I have your back." Group was over, but the patient did not want to leave the room because the intern was speaking to the patient.   Transportation Means: pt refused to answer   Supports: pt refused to answer   [employer] MSW, [employer]

## 2015-06-21 NOTE — Progress Notes (Signed)
[facility] MD Progress Note  06/21/2015 1:07 PM Margaret Christian  MRN:  [id number] Subjective:  Per nursing patient is frequently questioning medications and resistant to studies and labs. Candiss Norse has given patient papers on her medications. I asked patient if she had any questions for me about her medicines and she said she did not. She adamantly denied any suicidal or homicidal ideation. She denied any auditory hallucinations or visualizations. She has been seen by the hospitalist and started on blood pressure medications. Later in the day she reported a nurse's she felt dizzy. They checked her blood pressure and it was low 85/50. I discussed with nursing if she had been eating and drinking. In per nursing they felt like she might not have had anything to eat since breakfast yesterday because she was nothing by mouth for her abdominal ultrasound.  Principal Problem: Schizophrenia ([place]) Diagnosis:   Patient Active Problem List   Diagnosis Date Noted  . Encephalopathy, hypertensive [I67.4] 06/20/2015  . 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 Hypokalemia [E87.6] 06/20/2015  . Schizophrenia ([place]) [F20.9] 06/20/2015   Total Time spent with patient: 15 minutes  Past Psychiatric History:   Past Medical History:  Past Medical History  Diagnosis Date  . Paranoid schizophrenia ([place])    History reviewed. No pertinent past surgical history. Family History: History reviewed. No pertinent family history. Family Psychiatric  History:  Social History:  History  Alcohol Use  . 0.0 oz/week  . 0 Standard drinks or equivalent per week     History  Drug Use No    Social History   Social History  . Marital Status: Single    Spouse Name: N/A  . Number of Children: N/A  . Years of Education: N/A   Social History Main Topics  . Smoking status: Never Smoker   . Smokeless tobacco: None  . Alcohol Use: 0.0 oz/week    0 Standard drinks or equivalent per week  . Drug Use: No  . Sexual Activity: Not  Currently    Birth Control/ Protection: None   Other Topics Concern  . None   Social History Narrative   Additional Social History:                         Sleep: Good  Appetite:  Fair  Current Medications: Current Facility-Administered Medications  Medication Dose Route Frequency Provider Last Rate Last Dose  . acetaminophen (TYLENOL) tablet 650 mg  650 mg Oral Q6H PRN Hildred Priest, MD      . alum & mag hydroxide-simeth (MAALOX/MYLANTA) 200-200-20 MG/5ML suspension 30 mL  30 mL Oral Q4H PRN Hildred Priest, MD      . ARIPiprazole (ABILIFY) tablet 20 mg  20 mg Oral Daily Hildred Priest, MD   20 mg at 06/21/15 0911  . cloNIDine (CATAPRES) tablet 0.1 mg  0.1 mg Oral Daily PRN Srikar Sudini, MD   0.1 mg at 06/20/15 2212  . hydrochlorothiazide (HYDRODIURIL) tablet 25 mg  25 mg Oral Daily Hillary Bow, MD   25 mg at 06/21/15 0912  . lisinopril (PRINIVIL,ZESTRIL) tablet 40 mg  40 mg Oral Daily Hillary Bow, MD   40 mg at 06/21/15 0912  . LORazepam (ATIVAN) tablet 1 mg  1 mg Oral Q8H PRN Hildred Priest, MD   1 mg at 06/19/15 2211  . magnesium hydroxide (MILK OF MAGNESIA) suspension 30 mL  30 mL Oral Daily PRN Hildred Priest, MD      . potassium chloride (K-DUR,KLOR-CON) CR  tablet 40 mEq  40 mEq Oral Daily Hildred Priest, MD   40 mEq at 06/21/15 0912  . zolpidem (AMBIEN) tablet 5 mg  5 mg Oral QHS PRN Hildred Priest, MD   5 mg at 06/19/15 2211    Lab Results:  Results for orders placed or performed during the hospital encounter of 06/19/15 (from the past 48 hour(s))  Basic metabolic panel     Status: Abnormal   Collection Time: 06/20/15  8:11 AM  Result Value Ref Range   Sodium 133 (L) 135 - 145 mmol/L   Potassium 3.2 (L) 3.5 - 5.1 mmol/L   Chloride 98 (L) 101 - 111 mmol/L   CO2 25 22 - 32 mmol/L   Glucose, Bld 109 (H) 65 - 99 mg/dL   BUN 20 6 - 20 mg/dL   Creatinine, Ser 1.09 (H) 0.44 - 1.00  mg/dL   Calcium 9.0 8.9 - 10.3 mg/dL   GFR calc non Af Amer 59 (L) >60 mL/min   GFR calc Af Amer >60 >60 mL/min    Comment: (NOTE) The eGFR has been calculated using the CKD EPI equation. This calculation has not been validated in all clinical situations. eGFR's persistently <60 mL/min signify possible Chronic Kidney Disease.    Anion gap 10 5 - 15  Hemoglobin A1c     Status: None   Collection Time: 06/20/15  8:11 AM  Result Value Ref Range   Hgb A1c MFr Bld 5.4 4.0 - 6.0 %  Lipid panel, fasting     Status: Abnormal   Collection Time: 06/20/15  8:11 AM  Result Value Ref Range   Cholesterol 201 (H) 0 - 200 mg/dL   Triglycerides 168 (H) <150 mg/dL   HDL 26 (L) >40 mg/dL   Total CHOL/HDL Ratio 7.7 RATIO   VLDL 34 0 - 40 mg/dL   LDL Cholesterol 141 (H) 0 - 99 mg/dL    Comment:        Total Cholesterol/HDL:CHD Risk Coronary Heart Disease Risk Table                     Men   Women  1/2 Average Risk   3.4   3.3  Average Risk       5.0   4.4  2 X Average Risk   9.6   7.1  3 X Average Risk  23.4   11.0        Use the calculated Patient Ratio above and the CHD Risk Table to determine the patient's CHD Risk.        ATP III CLASSIFICATION (LDL):  <100     mg/dL   Optimal  100-129  mg/dL   Near or Above                    Optimal  130-159  mg/dL   Borderline  160-189  mg/dL   High  >190     mg/dL   Very High   TSH     Status: None   Collection Time: 06/20/15  8:11 AM  Result Value Ref Range   TSH 1.922 0.350 - 4.500 uIU/mL  Prolactin     Status: Abnormal   Collection Time: 06/20/15  8:11 AM  Result Value Ref Range   Prolactin 31.2 (H) 4.8 - 23.3 ng/mL    Comment: (NOTE) Performed At: [facility] [place], [place] [id number] Lindon Romp MD WU:[id number]   Troponin I (q 6hr x  3)     Status: None   Collection Time: 06/20/15 12:47 PM  Result Value Ref Range   Troponin I <0.03 <0.031 ng/mL    Comment:        NO INDICATION OF MYOCARDIAL  INJURY.     Physical Findings: AIMS: Facial and Oral Movements Muscles of Facial Expression: None, normal Lips and Perioral Area: None, normal Jaw: None, normal Tongue: None, normal,Extremity Movements Upper (arms, wrists, hands, fingers): None, normal Lower (legs, knees, ankles, toes): None, normal, Trunk Movements Neck, shoulders, hips: None, normal, Overall Severity Severity of abnormal movements (highest score from questions above): None, normal Incapacitation due to abnormal movements: None, normal Patient's awareness of abnormal movements (rate only patient's report): No Awareness, Dental Status Current problems with teeth and/or dentures?: No Does patient usually wear dentures?: No  CIWA:    COWS:     Musculoskeletal: Strength & Muscle Tone: within normal limits Gait & Station: normal Patient leans: N/A  Psychiatric Specialty Exam: Review of Systems  Psychiatric/Behavioral: Negative for depression, suicidal ideas, hallucinations, memory loss and substance abuse. The patient is not nervous/anxious and does not have insomnia.   All other systems reviewed and are negative.   Blood pressure 85/50, pulse 65, temperature 98 F (36.7 C), temperature source Oral, resp. rate 20, height 5' 4"  (1.626 m), weight 90.719 kg (200 lb), SpO2 93 %.Body mass index is 34.31 kg/(m^2).  General Appearance: Fairly Groomed  Engineer, water::  Fair  Speech:  Blocked  Volume:  Normal  Mood:  Okay  Affect:  Constricted  Thought Process:  Disorganized  Orientation:  Full (Time, Place, and Person)  Thought Content:  Paranoid Ideation  Suicidal Thoughts:  No  Homicidal Thoughts:  No  Memory:  Immediate;   Fair Recent;   Fair Remote;   Fair  Judgement:  Impaired  Insight:  Lacking  Psychomotor Activity:  Normal  Concentration:  Poor  Recall:  [place] of Knowledge:Fair  Language: Good  Akathisia:  Negative  Handed:    AIMS (if indicated):     Assets:  Social Support  ADL's:  Intact   Cognition: Impaired,  Moderate  Sleep:  Number of Hours: 5.75   Treatment Plan Summary: Daily contact with patient to assess and evaluate symptoms and progress in treatment, Medication management and Plan   Schizophrenia: Patient does not want to take any antipsychotic that increases the risk for diabetes, hypertension or dyslipidemia. On Abilify, admitting psychiatrist indicatedthere is a non emergent forced medication order on her chart.  Hypertension: Hospital as a started patient on lisinopril and hydrochlorothiazide, clonidine as needed. As discussed above her blood pressure was low and thus it may be secondary to his medications being started. Also suspected is low because patient was nothing by mouth for procedure and has had poor by mouth intake. Discussed with nursing pushing fluids and offering patient frequent fluids. Continue to monitor blood pressure.  Hypokalemia: I will order K Dur 40 mEq for 2 days  Hyponatremia: Sodium 133.  Chronic abdominal pain: patient denied any physical pain at this time however and abdominal ultrasound was ordered yesterday and however patient poorly got to the procedure room but then refused the study.  Insomnia: Continue Ambien 5 mg by mouth daily at bedtime when necessary  Anxiety and agitation: Continue Ativan 1 mg every 6 hours as needed  Diet low sodium  Precautions every 15 minute checks  Vital signs every 8 hours  Consults: Will consult the hospitalist for management of hypertension.  EKG: Will be completed now  Labs: Troponins negative.  Head CT completed at [facility] and negative  Chest xray completed at [facility] negative  Discharge disposition: Home once a stable  Discharge follow-up: To be determined  Collateral information: We'll need to obtain collateral information from family. Faith Rogue 06/21/2015, 1:07 PM

## 2015-06-21 NOTE — Progress Notes (Signed)
Ultrasound of the abdomen is ordered for patient.  Ultrasound technician informed us that patient must be NPO for 6 hours before the procedure.  Patient will be encouraged to eat dinner and snack this pm and then patient needs to be NPO after midnight tonight in preparation for abdominal ultrasound in the am.

## 2015-06-21 NOTE — Progress Notes (Signed)
Nutrition Follow-up   INTERVENTION:   Meals and Snacks: Cater to patient preferences Medical Food Supplement Therapy: will send [employer] Instant Breakfast daily for added nutrition   NUTRITION DIAGNOSIS:   Inadequate oral intake related to poor appetite, social / environmental circumstances as evidenced by per patient/family report, meal completion < 50%.  GOAL:   Patient will meet greater than or equal to 90% of their needs  MONITOR:    (Energy Intake, Electrolyte and renal Profile, Anthropometrics)  REASON FOR ASSESSMENT:   Malnutrition Screening Tool    ASSESSMENT:   Pt admitte with hypertension and possible schizophrenia.  Past Medical History  Diagnosis Date  . Paranoid schizophrenia ([facility])      Diet Order:  Diet 2 gram sodium Room service appropriate?: Yes; Fluid consistency:: Thin    Current Nutrition: Recorded po intake of 80% of breakfast yesterday, 0% since. Pt was NPO after midnight last night.   Food/Nutrition-Related History: Per MD note pt with decreased po intake 4 days PTA per pt's son.    Scheduled Medications:  . ARIPiprazole  20 mg Oral Daily  . hydrochlorothiazide  25 mg Oral Daily  . lisinopril  40 mg Oral Daily  . potassium chloride  40 mEq Oral Daily     Electrolyte/Renal Profile and Glucose Profile:   Recent Labs Lab 06/18/15 2055 06/20/15 0811  NA 141 133*  K 3.1* 3.2*  CL 104 98*  CO2 25 25  BUN 17 20  CREATININE 1.11* 1.09*  CALCIUM 9.8 9.0  GLUCOSE 122* 109*   Protein Profile:  Recent Labs Lab 06/18/15 2055  ALBUMIN 4.7    Height:   Ht Readings from Last 1 Encounters:  06/19/15  (1.626 m)    Weight:   Wt Readings from Last 1 Encounters:  06/19/15 200 lb (90.719 kg)    Ideal Body Weight:     BMI:  Body mass index is 34.31 kg/(m^2).   LOW Care Level  Leda Quail, [place], LDN Pager [phone] Weekend/On-Call Pager [phone]

## 2015-06-21 NOTE — Progress Notes (Signed)
D:  Patient is irritable and uncooperative on the unit this am.  Patient does not eat her breakfast, stating she does not feel well.  Lab technician came to collect a blood sample and patient would not cooperative with blood draw stating that the name on her bracelet was spelled wrong so she did not want to participate.  Patient did not attend groups.  Patient appeared to be confused regarding her whereabouts and situation.  Patient asked if she was in [place].  Patient would not eat her lunch and stated that she did not feel well.  Patient was offered G[place], water and gatorade and vital signs were taken.  Blood pressure was 87/50.  Patient was taken by wheelchair to room #25 so she would be visible by staff at all times and MD was notified.  Once her name misspelling was corrected on her chart and armband, the lab returned to draw blood with which patient cooperated.  Patient was offered beef broth in her room.  Patient denied suicidal ideation, homicidal ideation, auditory or visual hallucinations at the current time. A:  Scheduled medications were administered to patient per MD orders.  Emotional support and encouragement were provided.  Patient is maintained on q.15 minute safety checks.  Patient is informed to notify staff with any questions or concerns.   R:  No adverse medication reactions were noted.  Patient is very suspicious regarding medication administration, but does comply with reassurance.  Patient remains safe at the current time.

## 2015-06-21 NOTE — Plan of Care (Signed)
Problem: Ineffective individual coping Goal: STG: Patient will remain free from self harm Outcome: Progressing Patient remains free from self harm at the current time.  Maintain q.15 minute safety checks

## 2015-06-21 NOTE — [facility] Group Notes (Signed)
[facility] LCSW Group Therapy  06/21/2015 10:58 AM (late entry for 06/20/15)   Type of Therapy:  Group Therapy  Participation Level:  Did Not Attend   Modes of Intervention:  Discussion, Education, Socialization and Support  Summary of Progress/Problems: Balance in life: Patients will discuss the concept of balance and how it looks and feels to be unbalanced. Pt will identify areas in their life that is unbalanced and ways to become more balanced.    Elchanan Bob L Cleone Hulick MSW, LCSWA  06/21/2015, 10:58 AM

## 2015-06-21 NOTE — [facility] Group Notes (Signed)
[facility] LCSW Group Therapy  06/21/2015 1:39 PM  Type of Therapy:  Group Therapy  Participation Level:  Did Not Attend  Modes of Intervention:  Discussion, Education, Socialization and Support  Summary of Progress/Problems: Feelings around Relapse. Group members discussed the meaning of relapse and shared personal stories of relapse, how it affected them and others, and how they perceived themselves during this time. Group members were encouraged to identify triggers, warning signs and coping skills used when facing the possibility of relapse. Social supports were discussed and explored in detail.    Sharlie Shreffler L Aviva Wolfer MSW, LCSWA  06/21/2015, 1:39 PM   

## 2015-06-21 NOTE — Plan of Care (Signed)
Problem: Alteration in thought process Goal: LTG-Patient behavior demonstrates decreased signs psychosis (Patient behavior demonstrates decreased signs of psychosis to the point the patient is safe to return home and continue treatment in an outpatient setting.)  Outcome: Progressing Patient demonstrates sighs of decreased psychosis.

## 2015-06-21 NOTE — Plan of Care (Signed)
Problem: Ineffective individual coping Goal: LTG: Patient will report a decrease in negative feelings Outcome: Not Progressing Patient does not report feeling better today than she did yesterday

## 2015-06-22 ENCOUNTER — Inpatient Hospital Stay: Payer: No Typology Code available for payment source

## 2015-06-22 DIAGNOSIS — F200 Paranoid schizophrenia: Secondary | ICD-10-CM | POA: Insufficient documentation

## 2015-06-22 LAB — BASIC METABOLIC PANEL
ANION GAP: 5 (ref 5–15)
BUN: 22 mg/dL — ABNORMAL HIGH (ref 6–20)
CALCIUM: 9.1 mg/dL (ref 8.9–10.3)
CHLORIDE: 104 mmol/L (ref 101–111)
CO2: 30 mmol/L (ref 22–32)
Creatinine, Ser: 0.97 mg/dL (ref 0.44–1.00)
GFR calc non Af Amer: >60 mL/min (ref >60)
GLUCOSE: 109 mg/dL — AB (ref 65–99)
POTASSIUM: 4 mmol/L (ref 3.5–5.1)
Sodium: 139 mmol/L (ref 135–145)

## 2015-06-22 MED ORDER — GUAIFENESIN 100 MG/5ML PO SOLN
5.0000 mL | ORAL | Status: DC | PRN
  Filled 2015-06-22: qty 5

## 2015-06-22 NOTE — Plan of Care (Signed)
Problem: Ineffective individual coping Goal: LTG: Patient will report a decrease in negative feelings Outcome: Progressing Pt denies SI/HI.

## 2015-06-22 NOTE — Plan of Care (Signed)
Problem: Consults Goal: [facility] General Treatment Patient Education Outcome: Progressing Patient cooperative with plan of care.      

## 2015-06-22 NOTE — Progress Notes (Signed)
Patient with appropriate affect and cooperative behavior with NPO status, meds and plan of care. No SI/HI at this time. Quiet with peers, verbalizes needs appropriately with staff. Patient NPO this am for abdominal ultrasound and transported for test with Tech and Security via wheelchair. Safety maintained and patient returned to unit for am meds and am meal. No distress, no complaint.

## 2015-06-22 NOTE — Progress Notes (Signed)
D: Pt denies SI/HI/AVH. Pt is pleasant and cooperative, affect flat but brightens upon approach, patient's thoughts are organized/ logical , less psychosis noted. Patient's blood pressure was checked;  hypotensive so patient's  PRN clonidine was not given. Pt.  appears less anxious and she is interacting with peers and staff appropriately.  A: Pt was offered support and encouragement. Pt was given scheduled medications. Pt was encouraged to attend groups. Q 15 minute checks were done for safety.  R:Pt did not attend group. Pt is taking medication. Pt has no complaints.Pt receptive to treatment and safety maintained on unit.

## 2015-06-22 NOTE — Progress Notes (Signed)
[facility] - [place] at [facility]   PATIENT NAME: Margaret Christian    MR#:  [id number]  DATE OF BIRTH:  1965-12-13  SUBJECTIVE:   Blood pressure was low yesterday and this morning. She is asymptomatic. She is still having left abdominal pain which she says is chronic.  REVIEW OF SYSTEMS:    Review of Systems  Constitutional: Negative for fever, chills and malaise/fatigue.  HENT: Negative for sore throat.   Eyes: Negative for blurred vision.  Respiratory: Negative for cough, hemoptysis, shortness of breath and wheezing.   Cardiovascular: Negative for chest pain, palpitations and leg swelling.  Gastrointestinal: Positive for abdominal pain. Negative for nausea, vomiting, diarrhea and blood in stool.  Genitourinary: Negative for dysuria.  Musculoskeletal: Negative for back pain.  Neurological: Negative for dizziness, tremors and headaches.  Endo/Heme/Allergies: Does not bruise/bleed easily.    Tolerating Diet: yes      DRUG ALLERGIES:   Allergies  Allergen Reactions  . Shellfish Allergy Nausea And Vomiting    VITALS:  Blood pressure 103/63, pulse 91, temperature 98 F (36.7 C), temperature source Oral, resp. rate 20, height 5\' 4"  (1.626 m), weight 90.719 kg (200 lb), SpO2 100 %.  PHYSICAL EXAMINATION:   Physical Exam  Constitutional: She is oriented to person, place, and time and well-developed, well-nourished, and in no distress. No distress.  HENT:  Head: Normocephalic.  Eyes: No scleral icterus.  Neck: Normal range of motion. Neck supple. No JVD present. No tracheal deviation present.  Cardiovascular: Normal rate, regular rhythm and normal heart sounds.  Exam reveals no gallop and no friction rub.   No murmur heard. Pulmonary/Chest: Effort normal and breath sounds normal. No respiratory distress. She has no wheezes. She has no rales. She exhibits no tenderness.  Abdominal: Soft. Bowel sounds are normal. She exhibits no distension and no  mass. There is no tenderness. There is no rebound and no guarding.  Musculoskeletal: Normal range of motion. She exhibits no edema.  Neurological: She is alert and oriented to person, place, and time.  Skin: Skin is warm. No rash noted. No erythema.  Psychiatric: Affect and judgment normal.      LABORATORY PANEL:   CBC  Recent Labs Lab 06/18/15 2055  WBC 14.0*  HGB 13.0  HCT 40.2  PLT 399   ------------------------------------------------------------------------------------------------------------------  Chemistries   Recent Labs Lab 06/18/15 2055  06/22/15 0638  NA 141  < > 139  K 3.1*  < > 4.0  CL 104  < > 104  CO2 25  < > 30  GLUCOSE 122*  < > 109*  BUN 17  < > 22*  CREATININE 1.11*  < > 0.97  CALCIUM 9.8  < > 9.1  AST 32  --   --   ALT 30  --   --   ALKPHOS 67  --   --   BILITOT 0.7  --   --   < > = values in this interval not displayed. ------------------------------------------------------------------------------------------------------------------  Cardiac Enzymes  Recent Labs Lab 06/20/15 1247  TROPONINI <0.03   ------------------------------------------------------------------------------------------------------------------  RADIOLOGY:  No results found.   ASSESSMENT AND PLAN:   50 year old female with a history of untreated hypertension and possible schizophrenia who is admitted to the behavioral unit for psychosis. Hospitalist was consulted for elevation in blood pressure.  1. Accelerated hypertension: Patient now with low blood pressure. I will just discontinue both these medications and follow patient's blood pressure. If needed she can be restarted on lisinopril  or HCTZ.   2. Hyponatremia: Sodium level has improved.    3. Chronic left upper quadrant abdominal pain: Abdominal ultrasound pending.  4. Hypokalemia: Potassium is 4.0 this a.m.  5. Schizophrenia: Management per psychiatry   Management plans discussed with the patient  and she is in agreement.  CODE STATUS: full  TOTAL TIME TAKING CARE OF THIS PATIENT: 28 minutes.   Thank you for allowing me to person in the care of the patient. I will continue to follow.  POSSIBLE D/C ??, DEPENDING ON CLINICAL CONDITION.   Augustus Zurawski M.D on 06/22/2015 at 10:24 AM  Between 7am to 6pm - Pager - [phone] After 6pm go to [email or web address] - password [email or web address] [facility]  [facility]  Office  [phone]  CC: Primary care physician; No PCP Per Patient  Note: This dictation was prepared with Dragon dictation along with smaller phrase technology. Any transcriptional errors that result from this process are unintentional.

## 2015-06-22 NOTE — [facility] Group Notes (Signed)
HH LCSW Group Therapy  06/22/2015 3:05 PM  Type of Therapy:  Group Therapy  Participation Level:  Minimal  Participation Quality:  Attentive  Affect:  Appropriate  Cognitive:  Alert  Insight:  Limited  Engagement in Therapy:  Limited  Modes of Intervention:  Discussion, Education, Socialization and Support  Summary of Progress/Problems:  Mindfulness: Patient discussed mindfulness and relaxing techniques and why they are beneficial. Pt discussed ways to incorporate mindfulness in their lives. Pt practiced a mindfulness techique and discussed how it made them feel. Pt attended group and stayed most of the time. Pt sat quietly and listened to group members share.   Jonice Cerra L Ambrielle Kington MSW, LCSWA  06/22/2015, 3:05 PM   

## 2015-06-22 NOTE — Progress Notes (Addendum)
[facility] MD Progress Note  06/22/2015 2:25 PM Alexus Galka  MRN:  [id number] Subjective:  Per nursing patient is frequently questioning medications and resistant to studies and labs. Today she does seem a bit more reasonable and her thoughts seem bit more organized. She does ask about the results of her CT scan and abdominal ultrasound was done today. She asked about any abnormal lab studies. I explained to her that the studies were interpreted as normal. She did have a low potassium on admission but now that is corrected to the normal range. Patient states her main focus right now is getting discharged. She cites needing to address financial issues and pay bills and she is asking questions about when she will be discharged. She did seem more engaged today and met with Probation officer in the office. He did state she had a little bit of a cough and she has some pain when she costs. She states in the past she's taken guaifenesin which has been helpful for this. Principal Problem: Schizophrenia ([place]) Diagnosis:   Patient Active Problem List   Diagnosis Date Noted  . Encephalopathy, hypertensive [I67.4] 06/20/2015  . 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 Hypokalemia [E87.6] 06/20/2015  . Schizophrenia ([place]) [F20.9] 06/20/2015   Total Time spent with patient: 15 minutes  Past Psychiatric History:   Past Medical History:  Past Medical History  Diagnosis Date  . Paranoid schizophrenia ([place])    History reviewed. No pertinent past surgical history. Family History: History reviewed. No pertinent family history. Family Psychiatric  History:  Social History:  History  Alcohol Use  . 0.0 oz/week  . 0 Standard drinks or equivalent per week     History  Drug Use No    Social History   Social History  . Marital Status: Single    Spouse Name: N/A  . Number of Children: N/A  . Years of Education: N/A   Social History Main Topics  . Smoking status: Never Smoker   . Smokeless tobacco: None  .  Alcohol Use: 0.0 oz/week    0 Standard drinks or equivalent per week  . Drug Use: No  . Sexual Activity: Not Currently    Birth Control/ Protection: None   Other Topics Concern  . None   Social History Narrative   Additional Social History:                         Sleep: Good  Appetite:  Fair  Current Medications: Current Facility-Administered Medications  Medication Dose Route Frequency Provider Last Rate Last Dose  . acetaminophen (TYLENOL) tablet 650 mg  650 mg Oral Q6H PRN Hildred Priest, MD      . alum & mag hydroxide-simeth (MAALOX/MYLANTA) 200-200-20 MG/5ML suspension 30 mL  30 mL Oral Q4H PRN Hildred Priest, MD      . ARIPiprazole (ABILIFY) tablet 20 mg  20 mg Oral Daily Hildred Priest, MD   Stopped at 06/22/15 1000  . cloNIDine (CATAPRES) tablet 0.1 mg  0.1 mg Oral Daily PRN Hillary Bow, MD   0.1 mg at 06/20/15 2212  . guaiFENesin (ROBITUSSIN) 100 MG/5ML solution 100 mg  5 mL Oral Q4H PRN Hildred Priest, MD      . LORazepam (ATIVAN) tablet 1 mg  1 mg Oral Q8H PRN Hildred Priest, MD   1 mg at 06/19/15 2211  . magnesium hydroxide (MILK OF MAGNESIA) suspension 30 mL  30 mL Oral Daily PRN Hildred Priest, MD      .  zolpidem (AMBIEN) tablet 5 mg  5 mg Oral QHS PRN Hildred Priest, MD   5 mg at 06/19/15 2211    Lab Results:  Results for orders placed or performed during the hospital encounter of 06/19/15 (from the past 48 hour(s))  Basic metabolic panel     Status: Abnormal   Collection Time: 06/21/15  2:44 PM  Result Value Ref Range   Sodium 138 135 - 145 mmol/L   Potassium 3.5 3.5 - 5.1 mmol/L   Chloride 102 101 - 111 mmol/L   CO2 29 22 - 32 mmol/L   Glucose, Bld 113 (H) 65 - 99 mg/dL   BUN 18 6 - 20 mg/dL   Creatinine, Ser 0.84 0.44 - 1.00 mg/dL   Calcium 9.0 8.9 - 10.3 mg/dL   GFR calc non Af Amer >60 >60 mL/min   GFR calc Af Amer >60 >60 mL/min    Comment: (NOTE) The eGFR has  been calculated using the CKD EPI equation. This calculation has not been validated in all clinical situations. eGFR's persistently <60 mL/min signify possible Chronic Kidney Disease.    Anion gap 7 5 - 15  TSH     Status: None   Collection Time: 06/21/15  2:44 PM  Result Value Ref Range   TSH 1.258 0.350 - 4.500 uIU/mL  Basic metabolic panel     Status: Abnormal   Collection Time: 06/22/15  6:38 AM  Result Value Ref Range   Sodium 139 135 - 145 mmol/L   Potassium 4.0 3.5 - 5.1 mmol/L   Chloride 104 101 - 111 mmol/L   CO2 30 22 - 32 mmol/L   Glucose, Bld 109 (H) 65 - 99 mg/dL   BUN 22 (H) 6 - 20 mg/dL   Creatinine, Ser 0.97 0.44 - 1.00 mg/dL   Calcium 9.1 8.9 - 10.3 mg/dL   GFR calc non Af Amer >60 >60 mL/min   GFR calc Af Amer >60 >60 mL/min    Comment: (NOTE) The eGFR has been calculated using the CKD EPI equation. This calculation has not been validated in all clinical situations. eGFR's persistently <60 mL/min signify possible Chronic Kidney Disease.    Anion gap 5 5 - 15    Physical Findings: AIMS: Facial and Oral Movements Muscles of Facial Expression: None, normal Lips and Perioral Area: None, normal Jaw: None, normal Tongue: None, normal,Extremity Movements Upper (arms, wrists, hands, fingers): None, normal Lower (legs, knees, ankles, toes): None, normal, Trunk Movements Neck, shoulders, hips: None, normal, Overall Severity Severity of abnormal movements (highest score from questions above): None, normal Incapacitation due to abnormal movements: None, normal Patient's awareness of abnormal movements (rate only patient's report): No Awareness, Dental Status Current problems with teeth and/or dentures?: No Does patient usually wear dentures?: No  CIWA:    COWS:     Musculoskeletal: Strength & Muscle Tone: within normal limits Gait & Station: normal Patient leans: N/A  Psychiatric Specialty Exam: Review of Systems  Respiratory: Positive for cough.    Psychiatric/Behavioral: Negative for depression, suicidal ideas, hallucinations, memory loss and substance abuse. The patient is not nervous/anxious and does not have insomnia.   All other systems reviewed and are negative.   Blood pressure 103/63, pulse 91, temperature 98 F (36.7 C), temperature source Oral, resp. rate 20, height 5' 4"  (1.626 m), weight 90.719 kg (200 lb), SpO2 100 %.Body mass index is 34.31 kg/(m^2).  General Appearance: Fairly Groomed  Engineer, water::  Fair  Speech:  Blocked  Volume:  Normal  Mood:  Okay  Affect:  Constricted  Thought Process:  Slightly more organized however focused on lab results and study results as well as discharge  Orientation:  Full (Time, Place, and Person)  Thought Content:  Paranoid Ideation  Suicidal Thoughts:  No  Homicidal Thoughts:  No  Memory:  Immediate;   Fair Recent;   Fair Remote;   Fair  Judgement:  Impaired  Insight:  Lacking  Psychomotor Activity:  Normal  Concentration:  Poor  Recall:  [place] of Knowledge:Fair  Language: Good  Akathisia:  Negative  Handed:    AIMS (if indicated):     Assets:  Social Support  ADL's:  Intact  Cognition: Impaired,  Moderate  Sleep:  Number of Hours: 5.75   Treatment Plan Summary: Daily contact with patient to assess and evaluate symptoms and progress in treatment, Medication management and Plan   Schizophrenia: Patient does not want to take any antipsychotic that increases the risk for diabetes, hypertension or dyslipidemia. On Abilify, admitting psychiatrist indicatedthere is a non emergent forced medication order on her chart.  Hypertension: Hospital as a started patient on lisinopril and hydrochlorothiazide, clonidine as needed. As discussed above her blood pressure was low and thus it may be secondary to his medications being started. Also suspected is low because patient was nothing by mouth for procedure and has had poor by mouth intake. Discussed with nursing pushing fluids  and offering patient frequent fluids. Continue to monitor blood pressure.  Hypokalemia: I will order K Dur 40 mEq for 2 days  Hyponatremia: Sodium 133.  Chronic abdominal pain: Abdominal ultrasound was negative for any abnormalities.  Insomnia: Continue Ambien 5 mg by mouth daily at bedtime when necessary  Anxiety and agitation: Continue Ativan 1 mg every 6 hours as needed  Diet low sodium  Precautions every 15 minute checks  Vital signs every 8 hours  Consults: Will consult the hospitalist for management of hypertension.  EKG: Will be completed now  Labs: Troponins negative.  Head [facility] completed at [facility] and negative  Chest xray completed at [facility] negative  Cough-we'll prescribe some guaifenesin as needed.  Discharge disposition: Home once a stable  Discharge follow-up: To be determined  Collateral information: We'll need to obtain collateral information from family. Faith Rogue 06/22/2015, 2:25 PM

## 2015-06-23 NOTE — Progress Notes (Signed)
[facility] [place] [facility]   PATIENT NAME: Margaret Christian    MR#:  [id number]  DATE OF BIRTH:  March 22, 1966  SUBJECTIVE:   Patient is doing well this am She wants to speak with a doctor about signing herself voluntarily out of the psychiatric unit. She is not complaining of abdominal pain.  REVIEW OF SYSTEMS:    Review of Systems  Constitutional: Negative for fever, chills and malaise/fatigue.  HENT: Negative for sore throat.   Eyes: Negative for blurred vision.  Respiratory: Negative for cough, hemoptysis, shortness of breath and wheezing.   Cardiovascular: Negative for chest pain, palpitations and leg swelling.  Gastrointestinal: Negative for nausea, vomiting, diarrhea and blood in stool.  Genitourinary: Negative for dysuria.  Musculoskeletal: Negative for back pain.  Neurological: Negative for dizziness, tremors and headaches.  Endo/Heme/Allergies: Does not bruise/bleed easily.    Tolerating Diet: yes      DRUG ALLERGIES:   Allergies  Allergen Reactions  . Shellfish Allergy Nausea And Vomiting    VITALS:  Blood pressure 113/92, pulse 104, temperature 98 F (36.7 C), temperature source Oral, resp. rate 20, height 5\' 4"  (1.626 m), weight 90.719 kg (200 lb), SpO2 100 %.  PHYSICAL EXAMINATION:   Physical Exam  Constitutional: She is oriented to person, place, and time and well-developed, well-nourished, and in no distress. No distress.  HENT:  Head: Normocephalic.  Eyes: No scleral icterus.  Neck: Normal range of motion. Neck supple. No JVD present. No tracheal deviation present.  Cardiovascular: Normal rate, regular rhythm and normal heart sounds.  Exam reveals no gallop and no friction rub.   No murmur heard. Pulmonary/Chest: Effort normal and breath sounds normal. No respiratory distress. She has no wheezes. She has no rales. She exhibits no tenderness.  Abdominal: Soft. Bowel sounds are normal. She exhibits no distension and  no mass. There is no tenderness. There is no rebound and no guarding.  Musculoskeletal: Normal range of motion. She exhibits no edema.  Neurological: She is alert and oriented to person, place, and time.  Skin: Skin is warm. No rash noted. No erythema.  Psychiatric: Affect and judgment normal.      LABORATORY PANEL:   CBC  Recent Labs Lab 06/18/15 2055  WBC 14.0*  HGB 13.0  HCT 40.2  PLT 399   ------------------------------------------------------------------------------------------------------------------  Chemistries   Recent Labs Lab 06/18/15 2055  06/22/15 0638  NA 141  < > 139  K 3.1*  < > 4.0  CL 104  < > 104  CO2 25  < > 30  GLUCOSE 122*  < > 109*  BUN 17  < > 22*  CREATININE 1.11*  < > 0.97  CALCIUM 9.8  < > 9.1  AST 32  --   --   ALT 30  --   --   ALKPHOS 67  --   --   BILITOT 0.7  --   --   < > = values in this interval not displayed. ------------------------------------------------------------------------------------------------------------------  Cardiac Enzymes  Recent Labs Lab 06/20/15 1247  TROPONINI <0.03   ------------------------------------------------------------------------------------------------------------------  RADIOLOGY:  US Abdomen Complete  06/22/2015  CLINICAL DATA:  50 year old female with left upper quadrant abdominal pain, chronic. Initial encounter. EXAM: ABDOMEN ULTRASOUND COMPLETE COMPARISON:  None. FINDINGS: Gallbladder: No gallstones or wall thickening visualized. No sonographic Murphy sign noted by sonographer. Common bile duct: Diameter: 4 mm, normal Liver: No focal lesion identified. Within normal limits in parenchymal echogenicity. IVC: No abnormality visualized. Pancreas: Visualized  portions appear normal, echotexture within normal limits. Spleen: Size and appearance within normal limits. Splenic length 7.2 cm. Right Kidney: Length: 11.9 cm. Echogenicity within normal limits. No mass or hydronephrosis visualized. Left  Kidney: Length: 12.1 cm. Echogenicity within normal limits. No mass or hydronephrosis visualized. Abdominal aorta: No aneurysm visualized. Other findings: None. IMPRESSION: Negative abdomen ultrasound. Electronically Signed   By: Odessa Fleming M.D.   On: 06/22/2015 11:45     ASSESSMENT AND PLAN:   50 year old female with a history of untreated hypertension and possible schizophrenia who is admitted to the behavioral unit for psychosis. Hospitalist was consulted for elevation in blood pressure.  1. Accelerated hypertension: Patient's blood pressure is actually within normal limits and blood pressure medications have been held for 2 days. I suggest we continue to monitor without any blood pressure medications. She can have follow-up with her primary care physician regarding her blood pressure and she can monitor blood pressure at home. If in the hospital she has blood pressures systolic greater than 140 then I would recommend starting lisinopril 10 mg daily.   2. Hyponatremia: Sodium level has improved.    3. Chronic left upper quadrant abdominal pain: Abdominal ultrasound  is negative  4. Hypokalemia: This was repleted 5. Schizophrenia: Management per psychiatry  Thank you for allowing me to participate in the care of the patient. I will sign off please call me if you have any further questions.   Management plans discussed with the patient and she is in agreement.  CODE STATUS: full  TOTAL TIME TAKING CARE OF THIS PATIENT: 25 minutes.        Aava Deland M.D on 06/23/2015 at 11:36 AM  Between 7am to 6pm - Pager - [phone] After 6pm go to [email or web address] - password [email or web address] [facility]  [facility]  Office  [phone]  CC: Primary care physician; No PCP Per Patient  Note: This dictation was prepared with Dragon dictation along with smaller phrase technology. Any transcriptional errors that result from this process are unintentional.

## 2015-06-23 NOTE — Progress Notes (Signed)
Recreation Therapy Notes  Date: 01.23.17 Time: 3:00 pm Location: Craft Room  Group Topic: Self-expression  Goal Area(s) Addresses:  Patient will effectively use art as a means of self-expression. Patient will recognize positive benefit of self-expression. Patient will be able to identify one emotion experienced during group session. Patient will identify use of art as a coping skill.  Behavioral Response: Attentive, Left early  Intervention: Two Faces of Me  Activity: Patients were given a blank face worksheet and instructed to draw or write on one half of the face how they were feeling when they were admitted and draw or write on the other half how they want to feel when they are d/c.  Education: LRT educated patients on different forms of self-expression.  Education Outcome: Patient left before LRT educated group.  Clinical Observations/Feedback: Patient completed activity by writing how she felt when she was admitted and how she wants to feel when she is d/c. Patient left group at approximately 3:17 pm tearful. Patient did not return to group.  Jacquelynn Cree, LRT/CTRS 06/23/2015 4:09 PM

## 2015-06-23 NOTE — Tx Team (Signed)
Interdisciplinary Treatment Plan Update (Adult)         Date: 06/23/2015   Time Reviewed: 9:30 AM   Progress in Treatment: Improving Attending groups: Intermittently Participating in groups: Intermittently  Taking medication as prescribed: Yes  Tolerating medication: Yes  Family/Significant other contact made: No, pt refused Patient understands diagnosis: Yes  Discussing patient identified problems/goals with staff: Yes  Medical problems stabilized or resolved: Yes  Denies suicidal/homicidal ideation: Yes  Issues/concerns per patient self-inventory: Yes  Other:   New problem(s) identified: N/A   Discharge Plan or Barriers: CSW still assessing for appropriate contacts.  Pt has refused follow up appointments   Reason for Continuation of Hospitalization:   Depression   Anxiety   Medication Stabilization   Comments: N/A   Estimated length of stay: 3-5 days    Maeva Dant is a 50 y.o. female who was brought into the [facility] at the urging of her brother who was concerned at a recent change in his sister's behavior. Per pt record, pt's brother describes "burst of laughter" without any reason to laugh. Patient also has been interacting to internal stimuli and is seen by family talking to people that aren't there. She has been making paranoid comments.  In the ER patient patient displayed inappropriate laughter, she appeared paranoid about the computers in the cameras and was interacting to internal stimuli.  Patient was a poor historian.  Patient says 4 days before being admitted she started feeling bad. 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 She says she has all her stressors such as having about relationship with her mother. Then she started crying and said that her father passed away after she graduated from college.  During assessment today the patient's answers were tangential and in some occasions  were not related at all to the questions. During the assessment the patient was paranoid and made sarcastic comments such as "I smell a rat in here".  Per nursing she was very paranoid this morning and was argumentative about her medications. The patient was prescribed clonidine for blood pressure.  The patient's brother have reported she had a remote history of schizophrenia., however she has not been on any treatment for years has been highly functioning. It is very unlikely that the patient suffers from schizophrenia.  Collateral info was obtained from the brother: pt was diagnosed with schizophrenia 16 or 18 years ago. She has been hospitalized a multitude of times including the state facility in [place]. The patient has not had any treatment or follow-up in about 10 years. During one of her hospitalizations the patient got involved with a female patient then ended up having a child who is now 22 years old. Patient lives with her son. 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 The patient's brother was alerted of the patient's decompensation by the patient's 37 year old son. The son reported that the patient was laughing and yelling.  Patient lives in [place].  Patient will benefit from crisis stabilization, medication evaluation, group therapy, and psycho education in addition to case management for discharge planning. Patient and CSW reviewed pt's identified goals and treatment plan. Pt verbalized understanding and agreed to treatment plan. ng, she was trying to attack the dog. She was not sleeping and for 4 days  she didn't sleep at all, she was not eating. She was constantly looking out of the window.  Patient will benefit from crisis stabilization, medication evaluation, group therapy, and psycho education in addition to case management for discharge planning. Patient  and CSW reviewed pt's identified goals and treatment plan. Patient and CSW reviewed pt's identified goals and treatment plan. Pt verbalized understanding and agreed to treatment plan.        Review of initial/current patient goals per problem list:  1. Goal(s): Patient will participate in aftercare plan   Met: No Target date: 3-5 days post admission date   As evidenced by: Patient will participate within aftercare plan AEB aftercare provider and housing plan at discharge being identified.    1/23: Pt has refused follow up treatment    2. Goal (s): Patient will exhibit decreased depressive symptoms and suicidal ideations.   Met: No  Target date: 3-5 days post admission date   As evidenced by: Patient will utilize self-rating of depression at 3 or below and demonstrate decreased signs of depression or be deemed stable for discharge by MD.   1/23: Goal progressing.  Pt denies SI.    3. Goal(s): Patient will demonstrate decreased signs and symptoms of anxiety.   Met: No  Target date: 3-5 days post admission date   As evidenced by: Patient will utilize self-rating of anxiety at 3 or below and demonstrated decreased signs of anxiety, or be deemed stable for discharge by MD   1/23: Goal progressing   4 Goal(s): Patient will demonstrate decreased signs of psychosis  * Met: No * Target date: 3-5 days post admission date  * As evidenced by: Patient will demonstrate decreased frequency of AVH or return to baseline function   1/23: Goal progressing.      Attendees:  Patient:  Family:  Physician: Montel Culver, MD     06/23/2015 9:30 AM  Nursing: Alberteen Spindle, RN     06/23/2015 9:30 AM  Clinical Social Worker: Marylou Flesher, [place]  06/23/2015 9:30 AM  Clinical Social Worker: Carmell Austria, [place]  06/23/2015 9:30 AM  Nursing; Terressa Koyanagi    06/23/2015 9:30 AM  Other:        06/23/2015 9:30 AM  Other:        06/23/2015 9:30 AM

## 2015-06-23 NOTE — Progress Notes (Signed)
[facility] MD Progress Note  06/23/2015 4:51 PM Margaret Christian  MRN:  [id number] Subjective:  This is a 50 year old woman currently in the hospital under IVC because of recent onset or worsening of psychotic symptoms with dysfunction at home. Patient interviewed today. Chart reviewed including all notes in this hospitalization. Labs and vital signs reviewed. Patient appears to have had a worsening of psychotic symptoms over the course of days to weeks. The patient herself tells me that she noticed she was having trouble sleeping and resting well. She also indicated she been happening anxiety regarding her work and her finances recently. We talked about some ideation she is having about work which sounds most likely to be paranoid. Also some worries about her son's health, the reality of which is not clear to me. Patient reported to me that she is feeling better since coming into the hospital. She is sleeping better. She appears to be more compliant with medicine. Her vital signs have stabilized. Patient has not behaved in an acutely dangerous manner. She still seems to lack full insight into her condition. I note that social work found her to be uncooperative with completing the assessment today. When I spoke with the patient about this she claims that she would be willing to be cooperative but didn't feel that she was asked an appropriate way. I have encouraged her to discuss this situation again with social work so that she can work on discharge planning Principal Problem: Schizophrenia [facility]) Diagnosis:   Patient Active Problem List   Diagnosis Date Noted  . Paranoid schizophrenia ([place]) [F20.0]   . Encephalopathy, hypertensive [I67.4] 06/20/2015  . 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 Hypokalemia [E87.6] 06/20/2015  . Schizophrenia ([place]) [F20.9] 06/20/2015   Total Time spent with patient: 45 minutes  Past Psychiatric History: Patient apparently has had a few psychiatric episodes in the past most  recent one however is about 16 years ago. Patient recalls such past medicines as Navane and Risperdal which caused extrapyramidal symptoms. Diagnosis has been paid to schizophrenia but seems more likely to be schizoaffective or possibly a type of bipolar disorder.  Past Medical History:  Past Medical History  Diagnosis Date  . Paranoid schizophrenia ([place])    History reviewed. No pertinent past surgical history. Family History: History reviewed. No pertinent family history. Family Psychiatric  History: None clearly identified Social History:  History  Alcohol Use  . 0.0 oz/week  . 0 Standard drinks or equivalent per week     History  Drug Use No    Social History   Social History  . Marital Status: Single    Spouse Name: N/A  . Number of Children: N/A  . Years of Education: N/A   Social History Main Topics  . Smoking status: Never Smoker   . Smokeless tobacco: None  . Alcohol Use: 0.0 oz/week    0 Standard drinks or equivalent per week  . Drug Use: No  . Sexual Activity: Not Currently    Birth Control/ Protection: None   Other Topics Concern  . None   Social History Narrative   Additional Social History:                         Sleep: Fair  Appetite:  Fair  Current Medications: Current Facility-Administered Medications  Medication Dose Route Frequency Provider Last Rate Last Dose  . acetaminophen (TYLENOL) tablet 650 mg  650 mg Oral Q6H PRN Hildred Priest, MD      .  alum & mag hydroxide-simeth (MAALOX/MYLANTA) 200-200-20 MG/5ML suspension 30 mL  30 mL Oral Q4H PRN Hildred Priest, MD      . ARIPiprazole (ABILIFY) tablet 20 mg  20 mg Oral Daily Hildred Priest, MD   20 mg at 06/23/15 0355  . cloNIDine (CATAPRES) tablet 0.1 mg  0.1 mg Oral Daily PRN Hillary Bow, MD   0.1 mg at 06/20/15 2212  . guaiFENesin (ROBITUSSIN) 100 MG/5ML solution 100 mg  5 mL Oral Q4H PRN Hildred Priest, MD      . LORazepam (ATIVAN)  tablet 1 mg  1 mg Oral Q8H PRN Hildred Priest, MD   1 mg at 06/19/15 2211  . magnesium hydroxide (MILK OF MAGNESIA) suspension 30 mL  30 mL Oral Daily PRN Hildred Priest, MD      . zolpidem Lorrin Mais) tablet 5 mg  5 mg Oral QHS PRN Hildred Priest, MD   5 mg at 06/19/15 2211    Lab Results:  Results for orders placed or performed during the hospital encounter of 06/19/15 (from the past 48 hour(s))  Basic metabolic panel     Status: Abnormal   Collection Time: 06/22/15  6:38 AM  Result Value Ref Range   Sodium 139 135 - 145 mmol/L   Potassium 4.0 3.5 - 5.1 mmol/L   Chloride 104 101 - 111 mmol/L   CO2 30 22 - 32 mmol/L   Glucose, Bld 109 (H) 65 - 99 mg/dL   BUN 22 (H) 6 - 20 mg/dL   Creatinine, Ser 0.97 0.44 - 1.00 mg/dL   Calcium 9.1 8.9 - 10.3 mg/dL   GFR calc non Af Amer >60 >60 mL/min   GFR calc Af Amer >60 >60 mL/min    Comment: (NOTE) The eGFR has been calculated using the CKD EPI equation. This calculation has not been validated in all clinical situations. eGFR's persistently <60 mL/min signify possible Chronic Kidney Disease.    Anion gap 5 5 - 15    Physical Findings: AIMS: Facial and Oral Movements Muscles of Facial Expression: None, normal Lips and Perioral Area: None, normal Jaw: None, normal Tongue: None, normal,Extremity Movements Upper (arms, wrists, hands, fingers): None, normal Lower (legs, knees, ankles, toes): None, normal, Trunk Movements Neck, shoulders, hips: None, normal, Overall Severity Severity of abnormal movements (highest score from questions above): None, normal Incapacitation due to abnormal movements: None, normal Patient's awareness of abnormal movements (rate only patient's report): No Awareness, Dental Status Current problems with teeth and/or dentures?: No Does patient usually wear dentures?: No  CIWA:    COWS:     Musculoskeletal: Strength & Muscle Tone: within normal limits Gait & Station:  normal Patient leans: N/A  Psychiatric Specialty Exam: Review of Systems  Constitutional: Negative.   HENT: Negative.   Eyes: Negative.   Respiratory: Negative.   Cardiovascular: Negative.   Gastrointestinal: Negative.   Musculoskeletal: Negative.   Skin: Negative.   Neurological: Negative.   Psychiatric/Behavioral: Positive for memory loss. Negative for depression, suicidal ideas, hallucinations and substance abuse. The patient is nervous/anxious and has insomnia.     Blood pressure 113/92, pulse 104, temperature 98 F (36.7 C), temperature source Oral, resp. rate 20, height 5' 4"  (1.626 m), weight 90.719 kg (200 lb), SpO2 100 %.Body mass index is 34.31 kg/(m^2).  General Appearance: Casual  Eye Contact::  Fair  Speech:  Normal Rate  Volume:  Normal  Mood:  Anxious  Affect:  Constricted  Thought Process:  Goal Directed  Orientation:  Full (Time, Place, and  Person)  Thought Content:  Paranoid Ideation  Suicidal Thoughts:  No  Homicidal Thoughts:  No  Memory:  Immediate;   Good Recent;   Fair Remote;   Fair  Judgement:  Fair  Insight:  Fair  Psychomotor Activity:  Normal  Concentration:  Fair  Recall:  [employer] of Knowledge:Fair  Language: Fair  Akathisia:  No  Handed:  Right  AIMS (if indicated):     Assets:  Communication Skills Desire for Improvement Financial Resources/Insurance Housing Resilience Social Support  ADL's:  Intact  Cognition: WNL  Sleep:  Number of Hours: 7.25   Treatment Plan Summary: Daily contact with patient to assess and evaluate symptoms and progress in treatment, Medication management and Plan Patient is being treated with Abilify 20 mg per day which she has been compliant with. We discussed side effects and pros and cons of this medicine. I encouraged her to continue taking it as she appears to be tolerating it without problems so far. She still has paranoia to her thinking but seems to of calm down and slept better and bit more  organized since being here. I encouraged her to work with social work on trying to get in touch with her family. I will continue medicine without change today. Reviewed vital signs and most recent labs. Anticipate possible length of stay of another 2-3 days.  Estevon Fluke 06/23/2015, 4:51 PM

## 2015-06-23 NOTE — [facility] Suicide Risk Assessment (Signed)
[facility] INPATIENT:  Family/Significant Other Suicide Prevention Education  Suicide Prevention Education:  Patient Refusal for Family/Significant Other Suicide Prevention Education: The patient Margaret Christian has refused to provide written consent for family/significant other to be provided Family/Significant Other Suicide Prevention Education during admission and/or prior to discharge.  Physician notified.  Pt refused to allow the CSW to complete SPE with her.  Dorothe Pea Armando Bukhari 06/23/2015, 3:47 PM

## 2015-06-23 NOTE — Progress Notes (Signed)
Patient calm and cooperative during shift. Patient denies SI during shift. Patient voices concern for her 48 y/o son and wanting to let her brother know that she is in the hospital. Patient denies pain and voiced no other concerns during shift. No unsafe behavior noted. Will continue to monitor.

## 2015-06-23 NOTE — Progress Notes (Signed)
Patient denies depression & suicidal ideations.Compliant with medications.Appropriate with staff & peers.Attended groups.Appetite good.No issues verbalized.

## 2015-06-24 NOTE — Plan of Care (Signed)
Problem: Alteration in mood Goal: LTG-Pt's behavior demonstrates decreased signs of depression (Patient's behavior demonstrates decreased signs of depression to the point the patient is safe to return home and continue treatment in an outpatient setting)  Outcome: Progressing Rated her depression 0/10

## 2015-06-24 NOTE — [facility] Group Notes (Signed)
[facility] Group Notes:  (Nursing/MHT/Case Management/Adjunct)  Date:  06/24/2015  Time:  1:56 PM  Type of Therapy:  Psychoeducational Skills  Participation Level:  Active  Participation Quality:  Appropriate  Affect:  Appropriate  Cognitive:  Appropriate  Insight:  Appropriate  Engagement in Group:  Engaged  Modes of Intervention:  Discussion, Education and Support  Summary of Progress/Problems:  Margaret Christian 06/24/2015, 1:56 PM

## 2015-06-24 NOTE — Progress Notes (Signed)
Patient denies SI during shift. Patient c/o insomnia but does not want to take sleep meds because she is worried about being groggy the next day. No unsafe behaviors noted. Patient pleasant and cooperative during shift.

## 2015-06-24 NOTE — Progress Notes (Signed)
Recreation Therapy Notes  Date: 01.24.17 Time: 3:00 pm Location: Craft Room  Group Topic: Goal Setting  Goal Area(s) Addresses:  Patient will write at least one goal. Patient will write at least one obstacle.  Behavioral Response: Attentive, Interactive  Intervention: Recovery Goal Chart  Activity: Patients were instructed to make a Recovery Goal Chart including goals, obstacles, the date they started working on their goals, and the date they achieved their goal.  Education: LRT educated patients on healthy ways they can celebrate reaching their goals.  Education Outcome: Acknowledges education/In group clarification offered   Clinical Observations/Feedback: Patient completed activity by writing goals and obstacles. Patient contributed to group discussion by stating ways she can focus on her goals, and how she can celebrate reaching her goals in a healthy way.  Jacquelynn Cree, LRT/CTRS 06/24/2015 4:36 PM

## 2015-06-24 NOTE — [facility] Group Notes (Signed)
[facility] LCSW Group Therapy  06/24/2015 5:05 PM  Type of Therapy:  Group Therapy  Participation Level:  Active  Participation Quality:  Appropriate, Attentive and Sharing  Affect:  Depressed, Resistant and Tearful  Cognitive:  Appropriate  Insight:  Developing/Improving and Improving  Engagement in Therapy:  Engaged  Modes of Intervention:  Discussion, Education, Exploration, Problem-solving and Socialization  Summary of Progress/Problems:Pt attended and participated in group discussion around unhelpful thinking styles.  Utilize Power thought cards to demonstrate and Holiday representative. Shared her frustration and sadness that her family doesn't support her the way that she would like.  Pt had difficulty staying focused on the things within her control and often had to be redirected to focus on her own perception of this and how it was affecting her, what was in her control Jake Shark PMSW, LCSW 06/24/2015, 5:05 PM

## 2015-06-24 NOTE — Progress Notes (Signed)
Patient rated her depression 0/10.Denies suicidal & homicidal ideations.Appropriate with staff & peers.Compliant with medications.Attended groups.Appetite good.

## 2015-06-25 DIAGNOSIS — F3110 Bipolar disorder, current episode manic without psychotic features, unspecified: Secondary | ICD-10-CM

## 2015-06-25 MED ORDER — LORAZEPAM 1 MG PO TABS: 1.0000 mg | ORAL_TABLET | Freq: Three times a day (TID) | ORAL | Status: DC | PRN

## 2015-06-25 MED ORDER — ARIPIPRAZOLE 20 MG PO TABS: 20.0000 mg | ORAL_TABLET | Freq: Every day | ORAL | Status: DC

## 2015-06-25 MED ORDER — ZOLPIDEM TARTRATE 5 MG PO TABS: 5.0000 mg | ORAL_TABLET | Freq: Every evening | ORAL | Status: DC | PRN

## 2015-06-25 NOTE — Progress Notes (Signed)
D:  Patient expresses readiness for discharge.  Patient denies any pain.  Patient denies suicidal ideation, homicidal ideation, auditory or visual hallucinations currently.  A:  Discharge medications and their use was reviewed with the patient.  Belongings were returned to patient upon her leaving the unit.  Discharge and follow up instructions were reviewed with the patient.  Patient was escorted off the unit. R:  Patient signed for return of her belongings.  Patient cooperated with the discharge process.  Patient expressed understanding of discharge follow up and medications.  Patient remains safe at the time of discharge.

## 2015-06-25 NOTE — Discharge Summary (Signed)
Physician Discharge Summary Note  Patient:  Margaret Christian is an 50 y.o., female MRN:  [id number] DOB:  Mar 26, 1966 Patient phone:  [phone] (home)  Patient address:   [address] [place] [address],  Total Time spent with patient: 45 minutes  Date of Admission:  06/19/2015 Date of Discharge: 06/25/2015  Reason for Admission:  Patient was admitted to the hospital after referral by her family who found her displaying symptoms of confusion and likely related to bipolar disorder.  Principal Problem: Bipolar I disorder, most recent episode (or current) manic [facility]) Discharge Diagnoses: Patient Active Problem List   Diagnosis Date Noted  . Bipolar I disorder, most recent episode (or current) manic (HCC) [F31.10] 06/25/2015  . Paranoid schizophrenia (HCC) [F20.0]   . Schizophrenia (HCC) [F20.9] 06/20/2015    Past Psychiatric History: Patient has a past history of episodes of transient psychosis last occurring years ago. Had been reportedly relatively asymptomatic in the interim. New-onset confusion. Patient was initially paranoid in her attitude and noncooperative but  changed her mind and has subsequently been cooperative in the hospital. No past history of suicide attempts  Past Medical History:  Past Medical History  Diagnosis Date  . Paranoid schizophrenia (HCC)    History reviewed. No pertinent past surgical history. Family History: History reviewed. No pertinent family history. Family Psychiatric  History: Patient is not aware of a family history of mental illness Social History:  History  Alcohol Use  . 0.0 oz/week  . 0 Standard drinks or equivalent per week     History  Drug Use No    Social History   Social History  . Marital Status: Single    Spouse Name: N/A  . Number of Children: N/A  . Years of Education: N/A   Social History Main Topics  . Smoking status: Never Smoker   . Smokeless tobacco: None  . Alcohol Use: 0.0 oz/week    0 Standard  drinks or equivalent per week  . Drug Use: No  . Sexual Activity: Not Currently    Birth Control/ Protection: None   Other Topics Concern  . None   Social History Narrative    Hospital Course:  Patient was admitted to the hospital and started on antipsychotic medication. Initially noncooperative she changed her mind and has subsequently been cooperative with treatment. Symptoms have improved significantly during her hospital stay. She is sleeping better. She is not displaying any bizarre behavior or bizarre thinking. No longer appears to be paranoid. Denies hallucinations. She has not displayed any dangerous or aggressive behavior. Patient is tolerating medicine without side effects. Physically she is normal. She has been counseled about her illness specifically about the importance of staying on medication and having an outpatient psychiatric provider. At this point she no longer meets commitment criteria and is requesting discharge. She can be discharged to outpatient treatment in the community. Patient has preferred that I not call her family during the time she is here so I have not returned her calls.  Physical Findings: AIMS: Facial and Oral Movements Muscles of Facial Expression: None, normal Lips and Perioral Area: None, normal Jaw: None, normal Tongue: None, normal,Extremity Movements Upper (arms, wrists, hands, fingers): None, normal Lower (legs, knees, ankles, toes): None, normal, Trunk Movements Neck, shoulders, hips: None, normal, Overall Severity Severity of abnormal movements (highest score from questions above): None, normal Incapacitation due to abnormal movements: None, normal Patient's awareness of abnormal movements (rate only patient's report): No Awareness, Dental Status Current problems with  teeth and/or dentures?: No Does patient usually wear dentures?: No  CIWA:    COWS:     Musculoskeletal: Strength & Muscle Tone: within normal limits Gait & Station:  normal Patient leans: N/A  Psychiatric Specialty Exam: Review of Systems  Constitutional: Negative.   HENT: Negative.   Eyes: Negative.   Respiratory: Negative.   Cardiovascular: Negative.   Gastrointestinal: Negative.   Musculoskeletal: Negative.   Skin: Negative.   Neurological: Negative.   Psychiatric/Behavioral: Negative for depression, suicidal ideas, hallucinations, memory loss and substance abuse. The patient is not nervous/anxious and does not have insomnia.     Blood pressure 142/84, pulse 91, temperature 98.6 F (37 C), temperature source Oral, resp. rate 20, height  (1.626 m), weight 90.719 kg (200 lb), SpO2 100 %.Body mass index is 34.31 kg/(m^2).  General Appearance: Casual  Eye Contact::  Good  Speech:  Normal Rate  Volume:  Normal  Mood:  Euthymic  Affect:  Congruent  Thought Process:  Goal Directed  Orientation:  Full (Time, Place, and Person)  Thought Content:  Negative  Suicidal Thoughts:  No  Homicidal Thoughts:  No  Memory:  Immediate;   Fair Recent;   Fair Remote;   Fair  Judgement:  Fair  Insight:  Fair  Psychomotor Activity:  Normal  Concentration:  Fair  Recall:  Fair  Fund of Knowledge:Fair  Language: Fair  Akathisia:  No  Handed:  Right  AIMS (if indicated):     Assets:  Communication Skills Desire for Improvement Financial Resources/Insurance Housing Physical Health Resilience  ADL's:  Intact  Cognition: WNL  Sleep:  Number of Hours: 7   Have you used any form of tobacco in the last 30 days? (Cigarettes, Smokeless Tobacco, Cigars, and/or Pipes): No  Has this patient used any form of tobacco in the last 30 days? (Cigarettes, Smokeless Tobacco, Cigars, and/or Pipes) Yes, No  Metabolic Disorder Labs:  Lab Results  Component Value Date   HGBA1C 5.4 06/20/2015   Lab Results  Component Value Date   PROLACTIN 31.2* 06/20/2015   Lab Results  Component Value Date   CHOL 201* 06/20/2015   TRIG 168* 06/20/2015   HDL 26*  06/20/2015   CHOLHDL 7.7 06/20/2015   VLDL 34 06/20/2015   LDLCALC 141* 06/20/2015    See Psychiatric Specialty Exam and Suicide Risk Assessment completed by Attending Physician prior to discharge.  Discharge destination:  Home  Is patient on multiple antipsychotic therapies at discharge:  No   Has Patient had three or more failed trials of antipsychotic monotherapy by history:  No  Recommended Plan for Multiple Antipsychotic Therapies: NA      Discharge Instructions    Diet - low sodium heart healthy    Complete by:  As directed      Discharge instructions    Complete by:  As directed   Patient will meet with social work prior to discharge and will discuss outpatient treatment. She will follow-up with an outpatient psychiatrist. Continue current medicine as prescribed. Patient has been educated about symptoms of bipolar disorder and agrees to current treatment plan.     Increase activity slowly    Complete by:  As directed             Medication List    TAKE these medications      Indication   ARIPiprazole 20 MG tablet  Commonly known as:  ABILIFY  Take 1 tablet (20 mg total) by mouth daily.  LORazepam 1 MG tablet  Commonly known as:  ATIVAN  Take 1 tablet (1 mg total) by mouth every 8 (eight) hours as needed for anxiety (agitation).      zolpidem 5 MG tablet  Commonly known as:  AMBIEN  Take 1 tablet (5 mg total) by mouth at bedtime as needed for sleep.          Follow-up recommendations:  Activity:  Increase activity as tolerated Diet:  Heart healthy diet Tests:  No need for immediate testing she will follow-up with an outpatient provider for any further medication and testing  Comments:  Patient has shown significant improvement. Does not appear to be acutely psychotic. She is agreeable to the outpatient treatment plan. She will meet with social work and discuss an outpatient psychiatric provider before discharge. She will return to her own home.  Prescriptions for medication provided with a 30 day supply. Diagnosis and side effects of medicine and the risks and benefits of continued treatment reviewed with the patient who agrees to the plan  Signed: Mordecai Rasmussen 06/25/2015, 1:42 PM

## 2015-06-25 NOTE — Tx Team (Signed)
Interdisciplinary Treatment Plan Update (Adult)         Date: 06/25/2015   Time Reviewed: 9:30 AM   Progress in Treatment: Improving Attending groups: Intermittently Participating in groups: Intermittently  Taking medication as prescribed: Yes  Tolerating medication: Yes  Family/Significant other contact made: No, pt refused Patient understands diagnosis: Yes  Discussing patient identified problems/goals with staff: Yes  Medical problems stabilized or resolved: Yes  Denies suicidal/homicidal ideation: Yes  Issues/concerns per patient self-inventory: Yes  Other:   New problem(s) identified: N/A   Discharge Plan or Barriers: Pt plans to return home to [facility] to follow up with the [facility] for medication management and therapy   Reason for Continuation of Hospitalization:   Depression   Anxiety   Medication Stabilization   Comments: N/A   Estimated date of discharge: 06/25/15    Margaret Christian is a 50 y.o. female who was brought into the [facility] at the urging of her brother who was concerned at a recent change in his sister's behavior. Per pt record, pt's brother describes "burst of laughter" without any reason to laugh. Patient also has been interacting to internal stimuli and is seen by family talking to people that aren't there. She has been making paranoid comments.  In the ER patient patient displayed inappropriate laughter, she appeared paranoid about the computers in the cameras and was interacting to internal stimuli.  Patient was a poor historian.  Patient says 4 days before being admitted she started feeling bad. 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 She says she has all her stressors such as having about relationship with her mother. Then she started crying and said that her father passed away after she graduated from college.  During assessment today the patient's  answers were tangential and in some occasions were not related at all to the questions. During the assessment the patient was paranoid and made sarcastic comments such as "I smell a rat in here".  Per nursing she was very paranoid this morning and was argumentative about her medications. The patient was prescribed clonidine for blood pressure.  The patient's brother have reported she had a remote history of schizophrenia., however she has not been on any treatment for years has been highly functioning. It is very unlikely that the patient suffers from schizophrenia.  Collateral info was obtained from the brother: pt was diagnosed with schizophrenia 16 or 18 years ago. She has been hospitalized a multitude of times including the state facility in [place]. The patient has not had any treatment or follow-up in about 10 years. During one of her hospitalizations the patient got involved with a female patient then ended up having a child who is now 74 years old. Patient lives with her son. 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 The patient's brother was alerted of the patient's decompensation by the patient's 27 year old son. The son reported that the patient was laughing and yelling.  Patient lives in [place].  Patient will benefit from crisis stabilization, medication evaluation, group therapy, and psycho education in addition to case management for discharge planning. Patient and CSW reviewed pt's identified goals and treatment plan. Pt verbalized understanding and agreed to treatment plan. ng, she was trying to attack the dog. She was  not sleeping and for 4 days she didn't sleep at all, she was not eating. She was constantly looking out of the window.  Patient will benefit from crisis stabilization, medication evaluation, group therapy, and psycho education in addition to  case management for discharge planning. Patient and CSW reviewed pt's identified goals and treatment plan. Patient and CSW reviewed pt's identified goals and treatment plan. Pt verbalized understanding and agreed to treatment plan.        Review of initial/current patient goals per problem list:  1. Goal(s): Patient will participate in aftercare plan   Met: Yes  Target date: 3-5 days post admission date   As evidenced by: Patient will participate within aftercare plan AEB aftercare provider and housing plan at discharge being identified.    1/23: Pt has refused follow up treatment  1/25: Pt plans to return home to [facility] to follow up with the [facility] for medication management and therapy and with [facility] for treatment for hypertension    2. Goal (s): Patient will exhibit decreased depressive symptoms and suicidal ideations.   Met: Adequate for discharge per MD.  Target date: 3-5 days post admission date   As evidenced by: Patient will utilize self-rating of depression at 3 or below and demonstrate decreased signs of depression or be deemed stable for discharge by MD.   1/25: Adequate for discharge per MD.  1/23: Goal progressing.  Pt denies SI.  1/25: Adequate for discharge per MD.  Pt denies SI/HI.  Pt reports he is safe for discharge.    3. Goal(s): Patient will demonstrate decreased signs and symptoms of anxiety.   Met: No  Target date: 3-5 days post admission date   As evidenced by: Patient will utilize self-rating of anxiety at 3 or below and demonstrated decreased signs of anxiety, or be deemed stable for discharge by MD   1/23: Goal progressing  1/25:  Adequate for discharge per MD.   4 Goal(s): Patient will demonstrate decreased signs of psychosis  * Met: No * Target date: 3-5 days post admission date  * As evidenced by: Patient will demonstrate decreased frequency of AVH or return to baseline function   1/23: Goal  progressing.  Pt denies AVH  1/25: Adequate for discharge per MD.  Pt denies [place]  '    Attendees:  Patient:  Family:  Physician: Montel Culver, MD     06/25/2015 9:30 AM  Nursing: Carolynn Sayers, RN    06/25/2015 9:30 AM  Clinical Social Worker: Marylou Flesher, [place]  06/25/2015 9:30 AM  Clinical Social Worker: Carmell Austria, [place]  06/25/2015 9:30 AM  Nursing; Elige Radon, RN    06/25/2015 9:30 AM  Nursing: Carolynn Sayers     06/25/2015 9:30 AM  Other:        06/25/2015 9:30 AM

## 2015-06-25 NOTE — Progress Notes (Signed)
  [facility] Adult Case Management Discharge Plan :  Will you be returning to the same living situation after discharge:  Yes,  pt will be returning to her home to live in [place], [place] At discharge, do you have transportation home?: Yes,  pt will be picked up by her brother Do you have the ability to pay for your medications: Yes,  pt will be provided with prescriptions at discharge  Release of information consent forms completed and in the chart;  Patient's signature needed at discharge.  Patient to Follow up at: Follow-up Information    Follow up with [facility].   Why:  Please call to confirm your hospital follow-up for an assessment for medication managment and therapy in 5-7 days.    Contact information:   [address] [place], [place] [address] Phone: [phone] Fax: [phone]      Follow up with [facility].   Why:  Please arrive for your hospital follow up appointment on Thursday February 9th, 2017 at 4pm to see your general practitioner Laureen Ochs P.A.    Contact information:   [address], [place], [address] [phone] Fax: [phone]      Next level of care provider has access to [facility] Link:no  Safety Planning and Suicide Prevention discussed? No, pt refused sPE  Have you used any form of tobacco in the last 30 days? (Cigarettes, Smokeless Tobacco, Cigars, and/or Pipes): No  Has patient been referred to the Quitline?: N/A patient is not a smoker  Patient has been referred for addiction treatment: N/A  Mercy Riding 06/25/2015, 2:29 PM

## 2015-06-25 NOTE — [facility] Suicide Risk Assessment (Signed)
[facility] Discharge Suicide Risk Assessment   Principal Problem: Bipolar I disorder, most recent episode (or current) manic [facility]) Discharge Diagnoses:  Patient Active Problem List   Diagnosis Date Noted  . Bipolar I disorder, most recent episode (or current) manic (HCC) [F31.10] 06/25/2015  . Paranoid schizophrenia (HCC) [F20.0]   . Schizophrenia (HCC) [F20.9] 06/20/2015    Total Time spent with patient: 45 minutes  Musculoskeletal: Strength & Muscle Tone: within normal limits Gait & Station: normal Patient leans: N/A  Psychiatric Specialty Exam: Review of Systems  Constitutional: Negative.   HENT: Negative.   Eyes: Negative.   Respiratory: Negative.   Cardiovascular: Negative.   Gastrointestinal: Negative.   Musculoskeletal: Negative.   Skin: Negative.   Neurological: Negative.   Psychiatric/Behavioral: Negative for depression, suicidal ideas, hallucinations, memory loss and substance abuse. The patient is not nervous/anxious and does not have insomnia.     Blood pressure 142/84, pulse 91, temperature 98.6 F (37 C), temperature source Oral, resp. rate 20, height  (1.626 m), weight 90.719 kg (200 lb), SpO2 100 %.Body mass index is 34.31 kg/(m^2).  General Appearance: Casual  Eye Contact::  Good  Speech:  Clear and Coherent409  Volume:  Normal  Mood:  Euthymic  Affect:  Constricted  Thought Process:  Goal Directed  Orientation:  Full (Time, Place, and Person)  Thought Content:  Negative  Suicidal Thoughts:  No  Homicidal Thoughts:  No  Memory:  Immediate;   Fair Recent;   Fair Remote;   Fair  Judgement:  Fair  Insight:  Fair  Psychomotor Activity:  Normal  Concentration:  Good  Recall:  Good  Fund of Knowledge:Good  Language: Good  Akathisia:  No  Handed:  Right  AIMS (if indicated):     Assets:  Communication Skills Desire for Improvement Financial Resources/Insurance Housing Physical Health Resilience Social Support  Sleep:  Number of Hours: 7   Cognition: WNL  ADL's:  Intact   Mental Status Per Nursing Assessment::   On Admission:  NA  Demographic Factors:  Caucasian  Loss Factors: Financial problems/change in socioeconomic status and Patient sites some recent concerns about her job and overall life plans as being a stress.  Historical Factors: NA  Risk Reduction Factors:   Responsible for children under 62 years of age, Sense of responsibility to family and Religious beliefs about death  Continued Clinical Symptoms:  Bipolar Disorder:   Mixed State  Cognitive Features That Contribute To Risk:  None    Suicide Risk:  Minimal: No identifiable suicidal ideation.  Patients presenting with no risk factors but with morbid ruminations; may be classified as minimal risk based on the severity of the depressive symptoms    Plan Of Care/Follow-up recommendations:  Activity:  Activity as tolerated Diet:  Heart healthy diet Tests:  Follow-up for all further testing with outpatient psychiatrist and primary care doctor  Mordecai Rasmussen, MD 06/25/2015, 1:46 PM

## 2015-06-25 NOTE — [facility] Group Notes (Signed)
[facility] Group Notes:  (Nursing/MHT/Case Management/Adjunct)  Date:  06/25/2015  Time:  12:47 PM  Type of Therapy:  Group Therapy  Participation Level:  Active  Participation Quality:  Appropriate  Affect:  Appropriate  Cognitive:  Appropriate  Insight:  Good  Engagement in Group:  Supportive  Modes of Intervention:  Support  Summary of Progress/Problems:  Margaret Christian 06/25/2015, 12:47 PM

## 2015-06-25 NOTE — [facility] Group Notes (Signed)
[facility] LCSW Aftercare Discharge Planning Group Note   06/25/2015 11:03 AM  Participation Quality:  Active   Mood/Affect:  Appropriate  Depression Rating:   0  Anxiety Rating:  0  Thoughts of Suicide:  No Will you contract for safety?   NA  Current AVH:  No  Plan for Discharge/Comments:  Pt plans to return home and follow up with outpatient.  She reports her visit with her brother did not go well last night. She states he said things that made her upset. However, she was glad to see her son.   Transportation Means: Family   Supports: Family    Margaret Christian [employer] MSW, [employer]

## 2015-06-25 NOTE — Progress Notes (Signed)
Alert and oriented x4.  Denies SI, depression or AVH.  Medication compliant.  Support and encouragement offered.  Safety maintained.

## 2015-06-25 NOTE — Progress Notes (Signed)
D: Pt denies SI/HI/AVH. Pt is pleasant and cooperative, affect flat. Appropriate behaviors with staff and peers. A: Pt was offered support and encouragement.  Pt was encouraged to attend groups. Q15 minute checks maintained for safety. Medications given as prescribed. R: Pt was pleasant and compliant this evening, affect flat. Pts brother visited this evening and she expressed some anxiety over their meeting related to him taking care of her son while she is in the hospital. She woke up around 0205 and was unable to sleep due to feeling anxious. Vitals assessed,  PRN Ativan was given. Will continue to monitor.

## 2015-06-26 ENCOUNTER — Encounter: Payer: Self-pay | Admitting: Internal Medicine

## 2016-07-22 ENCOUNTER — Encounter (HOSPITAL_COMMUNITY): Payer: Self-pay | Admitting: Emergency Medicine

## 2016-07-22 ENCOUNTER — Emergency Department (HOSPITAL_COMMUNITY)
Admission: EM | Admit: 2016-07-22 | Discharge: 2016-07-23 | Disposition: A | Discharge status: Other Acute Inpatient Hospital | Payer: 59 | Attending: Emergency Medicine | Admitting: Emergency Medicine

## 2016-07-22 DIAGNOSIS — F250 Schizoaffective disorder, bipolar type: Secondary | ICD-10-CM | POA: Diagnosis not present

## 2016-07-22 DIAGNOSIS — F17210 Nicotine dependence, cigarettes, uncomplicated: Secondary | ICD-10-CM | POA: Insufficient documentation

## 2016-07-22 DIAGNOSIS — F200 Paranoid schizophrenia: Secondary | ICD-10-CM | POA: Insufficient documentation

## 2016-07-22 DIAGNOSIS — I10 Essential (primary) hypertension: Secondary | ICD-10-CM | POA: Insufficient documentation

## 2016-07-22 DIAGNOSIS — F3110 Bipolar disorder, current episode manic without psychotic features, unspecified: Secondary | ICD-10-CM | POA: Diagnosis present

## 2016-07-22 DIAGNOSIS — F29 Unspecified psychosis not due to a substance or known physiological condition: Secondary | ICD-10-CM | POA: Diagnosis present

## 2016-07-22 DIAGNOSIS — R109 Unspecified abdominal pain: Secondary | ICD-10-CM | POA: Insufficient documentation

## 2016-07-22 DIAGNOSIS — Z91013 Allergy to seafood: Secondary | ICD-10-CM | POA: Diagnosis not present

## 2016-07-22 DIAGNOSIS — Z79899 Other long term (current) drug therapy: Secondary | ICD-10-CM | POA: Diagnosis not present

## 2016-07-22 LAB — PREGNANCY, URINE: Preg Test, Ur: NEGATIVE

## 2016-07-22 LAB — COMPREHENSIVE METABOLIC PANEL WITH GFR
ALT: 20 U/L (ref 14–54)
AST: 19 U/L (ref 15–41)
Albumin: 4.7 g/dL (ref 3.5–5.0)
Alkaline Phosphatase: 56 U/L (ref 38–126)
Anion gap: 8 (ref 5–15)
BUN: 16 mg/dL (ref 6–20)
CO2: 26 mmol/L (ref 22–32)
Calcium: 9.3 mg/dL (ref 8.9–10.3)
Chloride: 106 mmol/L (ref 101–111)
Creatinine, Ser: 0.92 mg/dL (ref 0.44–1.00)
GFR calc Af Amer: >60 mL/min
GFR calc non Af Amer: >60 mL/min
Glucose, Bld: 107 mg/dL — ABNORMAL HIGH (ref 65–99)
Potassium: 3.6 mmol/L (ref 3.5–5.1)
Sodium: 140 mmol/L (ref 135–145)
Total Bilirubin: 1 mg/dL (ref 0.3–1.2)
Total Protein: 8.1 g/dL (ref 6.5–8.1)

## 2016-07-22 LAB — RAPID URINE DRUG SCREEN, HOSP PERFORMED
Amphetamines: NOT DETECTED
Barbiturates: NOT DETECTED
Benzodiazepines: NOT DETECTED
Cocaine: NOT DETECTED
OPIATES: NOT DETECTED
Tetrahydrocannabinol: NOT DETECTED

## 2016-07-22 LAB — I-STAT BETA HCG BLOOD, ED (MC, WL, AP ONLY)

## 2016-07-22 LAB — URINALYSIS, ROUTINE W REFLEX MICROSCOPIC
Bilirubin Urine: NEGATIVE
GLUCOSE, UA: NEGATIVE mg/dL
Ketones, ur: 20 mg/dL — AB
LEUKOCYTES UA: NEGATIVE
NITRITE: NEGATIVE
PROTEIN: 100 mg/dL — AB
SPECIFIC GRAVITY, URINE: 1.024 (ref 1.005–1.030)
pH: 5 (ref 5.0–8.0)

## 2016-07-22 LAB — ETHANOL

## 2016-07-22 LAB — CBC
HCT: 39.2 % (ref 36.0–46.0)
Hemoglobin: 12.7 g/dL (ref 12.0–15.0)
MCH: 26.9 pg (ref 26.0–34.0)
MCHC: 32.4 g/dL (ref 30.0–36.0)
MCV: 83.1 fL (ref 78.0–100.0)
PLATELETS: 419 10*3/uL — AB (ref 150–400)
RBC: 4.72 MIL/uL (ref 3.87–5.11)
RDW: 16.3 % — AB (ref 11.5–15.5)
WBC: 13.5 10*3/uL — ABNORMAL HIGH (ref 4.0–10.5)

## 2016-07-22 LAB — LIPASE, BLOOD: LIPASE: 28 U/L (ref 11–51)

## 2016-07-22 LAB — ACETAMINOPHEN LEVEL: Acetaminophen (Tylenol), Serum: <10 ug/mL — ABNORMAL LOW (ref 10–30)

## 2016-07-22 LAB — SALICYLATE LEVEL

## 2016-07-22 MED ORDER — DIPHENHYDRAMINE HCL 50 MG/ML IJ SOLN
50.0000 mg | Freq: Once | INTRAMUSCULAR | Status: AC
  Administered 2016-07-22: 50 mg via INTRAMUSCULAR
  Filled 2016-07-22: qty 1

## 2016-07-22 MED ORDER — LORAZEPAM 2 MG/ML IJ SOLN
2.0000 mg | Freq: Once | INTRAMUSCULAR | Status: AC
  Administered 2016-07-22: 2 mg via INTRAMUSCULAR
  Filled 2016-07-22: qty 1

## 2016-07-22 MED ORDER — STERILE WATER FOR INJECTION IJ SOLN
INTRAMUSCULAR | Status: AC
  Administered 2016-07-22: 16:00:00
  Filled 2016-07-22: qty 10

## 2016-07-22 MED ORDER — LORAZEPAM 2 MG/ML IJ SOLN: 1.0000 mg | Freq: Once | INTRAMUSCULAR | Status: DC

## 2016-07-22 MED ORDER — HYDROCHLOROTHIAZIDE 12.5 MG PO CAPS
25.0000 mg | ORAL_CAPSULE | Freq: Once | ORAL | Status: AC
  Administered 2016-07-23: 25 mg via ORAL
  Filled 2016-07-22 (×2): qty 2

## 2016-07-22 MED ORDER — ASENAPINE MALEATE 5 MG SL SUBL
10.0000 mg | SUBLINGUAL_TABLET | Freq: Two times a day (BID) | SUBLINGUAL | Status: DC
  Administered 2016-07-22: 10 mg via SUBLINGUAL
  Filled 2016-07-22: qty 2

## 2016-07-22 MED ORDER — ZIPRASIDONE MESYLATE 20 MG IM SOLR
20.0000 mg | Freq: Once | INTRAMUSCULAR | Status: AC
  Administered 2016-07-22: 20 mg via INTRAMUSCULAR
  Filled 2016-07-22: qty 20

## 2016-07-22 MED ORDER — ACETAMINOPHEN 325 MG PO TABS: 650.0000 mg | ORAL_TABLET | ORAL | Status: DC | PRN

## 2016-07-22 NOTE — ED Notes (Signed)
Patient refusing to change into paper scrubs.  Patient states, "I am more comfortable in my clothes and would like to wait to see the doctor and have my blood checked before I do any changing".

## 2016-07-22 NOTE — ED Notes (Signed)
Patient was able to walk back to the unit, but was very drowsy.  She was unable to carry on a conversation with me.  She made no sense and was just saying random words and not putting words together in a sentence.  She fell asleep shortly after arrival to the unit and is still resting at this time.

## 2016-07-22 NOTE — ED Notes (Addendum)
Patient very paranoid, talking out loud and refusing medication.  Nurse practitioner notified.

## 2016-07-22 NOTE — BH Assessment (Addendum)
Assessment Note  Margaret Christian is a 51 y.o. female. Upon entering the room to speak to pt, pt just stares at clinician. Clinician introduces self and asks pt for her name. Pt says her name is "Margaret Christian". Clinician requests a last name, pt says, "Pigeon". Clinician advises pt of the name listed for her and pt says, "who are you?". Clinician explains role and pt says "close the door, I have to tell you something". Clinician closes the door and pt just lays there, starting off towards the ceiling for 30-45 seconds. Clinician prompts pt to share what she wanted to say. Pt says, "I'm very sick and worried about my baby...something happened...my brother.Margaret KitchenMarland KitchenI'm just now remembering". Clinician asks pt how old her baby is and pt says she doesn't know. Pt then stops talking and starts staring towards the ceiling again for another extended period of time. Clinician prompts pt again and pt shares that her husband is a Pensions consultant and they saw a murder 2 days ago and the person was this clinician and it was gruesome and...etc.  Pt is clearly psychotic. No pertinent hx was able to be obtained from her. Per EDP note, pt's brother, Margaret Christian, shared that she has not slept in the past 3 days. She has been off all her medicines and wandering away from the house and driving when she is not supposed to. In addition, she lives with her 13 year old son who told Margaret Christian that she has been having aggressive behavior towards him, threatening him and throwing glass at him.   Diagnosis: Schizophrenia  Past Medical History:  Past Medical History:  Diagnosis Date  . Hypertension   . Paranoid schizophrenia (HCC)   . Phlebitis     Past Surgical History:  Procedure Laterality Date  . CESAREAN SECTION  2001    Family History:  Family History  Problem Relation Age of Onset  . Heart failure Father   . Hypertension Father   . Hypertension Brother   . Diabetes type II Paternal Grandfather   . Aortic aneurysm Maternal  Grandfather   . Coronary artery disease Maternal Grandfather   . Stroke Maternal Grandfather     Social History:  reports that she has been smoking Cigarettes.  She does not have any smokeless tobacco history on file. She reports that she drinks alcohol. She reports that she does not use drugs.  Additional Social History:  Alcohol / Drug Use Pain Medications: see PTA list Prescriptions: See PTA list Over the Counter: see PTA list History of alcohol / drug use?:  (unknown)  CIWA: CIWA-Ar BP: 148/62 Pulse Rate: 113 COWS:    Allergies:  Allergies  Allergen Reactions  . Shellfish Allergy Hives    Clams, oysters, scallops, muscles   . Shellfish Allergy Nausea And Vomiting    Home Medications:  (Not in a hospital admission)  OB/GYN Status:  No LMP recorded.  General Assessment Data Location of Assessment: [facility] TTS Assessment: In system Is this a Tele or Face-to-Face Assessment?: Face-to-Face Is this an Initial Assessment or a Re-assessment for this encounter?: Initial Assessment Marital status: Married Is patient pregnant?: No Pregnancy Status: No Living Arrangements: Children Can pt return to current living arrangement?: Yes Admission Status: Involuntary Is patient capable of signing voluntary admission?: No Referral Source: Self/Family/Friend     Crisis Care Plan Living Arrangements: Children  Education Status Is patient currently in school?: No  Risk to self with the past 6 months Suicidal Ideation: No Has patient been a risk  to self within the past 6 months prior to admission? : No Suicidal Intent: No Has patient had any suicidal intent within the past 6 months prior to admission? : No Is patient at risk for suicide?: No Suicidal Plan?: No Has patient had any suicidal plan within the past 6 months prior to admission? : No Access to Means: No Previous Attempts/Gestures: No Intentional Self Injurious Behavior: None Family Suicide History: Unable to  assess Persecutory voices/beliefs?: No Depression: No Substance abuse history and/or treatment for substance abuse?: No Suicide prevention information given to non-admitted patients: Not applicable  Risk to Others within the past 6 months Homicidal Ideation: No Does patient have any lifetime risk of violence toward others beyond the six months prior to admission? : No Thoughts of Harm to Others: No Current Homicidal Intent: No Current Homicidal Plan: No Access to Homicidal Means: No History of harm to others?: No Assessment of Violence: None Noted Does patient have access to weapons?: No Criminal Charges Pending?: No Does patient have a court date: No Is patient on probation?: No  Psychosis Hallucinations: Auditory, Visual (suspected) Delusions: Unspecified  Mental Status Report Appearance/Hygiene: Unremarkable Eye Contact: Poor Motor Activity: Rigidity Speech: Pressured Level of Consciousness: Alert Mood: Anxious, Suspicious, Fearful, Helpless, Preoccupied Affect: Appropriate to circumstance Anxiety Level: Moderate Thought Processes: Irrelevant, Thought Blocking Judgement: Impaired Orientation: Not oriented Obsessive Compulsive Thoughts/Behaviors: Unable to Assess  Cognitive Functioning Concentration: Poor Memory: Remote Impaired, Recent Impaired Insight: Poor Impulse Control: Unable to Assess Sleep: Unable to Assess Vegetative Symptoms: Unable to Assess  ADLScreening [facility]) Patient's cognitive ability adequate to safely complete daily activities?: Yes Patient able to express need for assistance with ADLs?: Yes Independently performs ADLs?: Yes (appropriate for developmental age)  Prior Inpatient Therapy Prior Inpatient Therapy: Yes Prior Therapy Dates: 06/2015 Prior [facility]Provider[facility] Reason for Treatment: Schizoaffective  Prior Outpatient Therapy Does patient have an ACCT team?: Unknown Does patient have Intensive In-House  Services?  : No Does patient have [facility] services? : Unknown Does patient have P4CC services?: No  ADL Screening (condition at time of admission) Patient's cognitive ability adequate to safely complete daily activities?: Yes Is the patient deaf or have difficulty hearing?: No Does the patient have difficulty seeing, even when wearing glasses/contacts?: No Does the patient have difficulty concentrating, remembering, or making decisions?: Yes Patient able to express need for assistance with ADLs?: Yes Does the patient have difficulty dressing or bathing?: No Independently performs ADLs?: Yes (appropriate for developmental age) Does the patient have difficulty walking or climbing stairs?: No Weakness of Legs: None Weakness of Arms/Hands: None  Home Assistive Devices/Equipment Home Assistive Devices/Equipment: None  Therapy Consults (therapy consults require a physician order) PT Evaluation Needed: No OT Evalulation Needed: No SLP Evaluation Needed: No Abuse/Neglect Assessment (Assessment to be complete while patient is alone) Physical Abuse:  (UTA) Verbal Abuse:  (UTA) Sexual Abuse:  (UTA) Exploitation of patient/patient's resources:  (UTA) Self-Neglect:  (UTA) Values / Beliefs Cultural Requests During Hospitalization: None Spiritual Requests During Hospitalization: None Consults Spiritual Care Consult Needed: No Social Work Consult Needed: No Merchant navy officerAdvance Directives (For Healthcare) Does Patient Have a Medical Advance Directive?: No Would patient like information on creating a medical advance directive?: No - Patient declined    Additional Information 1:1 In Past 12 Months?: No CIRT Risk: No Elopement Risk: No Does patient have medical clearance?: Yes     Disposition:  Disposition Initial Assessment Completed for this Encounter: Yes (consulted with Nanine MeansJamison Lord, DNP) Disposition of Patient:  Inpatient treatment program Type of inpatient treatment program: Adult  On Site  Evaluation by:   Reviewed with Physician:    Laddie Aquas 07/22/2016 1:59 PM

## 2016-07-22 NOTE — ED Notes (Addendum)
Patient changed into purple scrubs and yellow socks. Patient belongings (2 bags) and patient wanded by security. Patient's belongings placed in patient belongings cabinet 23-25.

## 2016-07-22 NOTE — ED Provider Notes (Signed)
9:57 PM called to see patient due to a symptomatically hypertension. Her blood pressure is 210/90. This was during the routine vital sign check. The nurse reports the patient has been asymptomatic. However she also is refusing all oral meds. She is currently under psychiatric care for psychosis. When I went to evaluate the patient she is ignoring me. She resists all attempts to get her to wake up. Chart review shows she has had hypertension many times before, does not take medicines. At this point there is no indication that she is having a symptomatic hypertensive emergency, will continue to monitor and offer oral meds at this time.   Pricilla LovelessScott Jaleah Lefevre, MD 07/22/16 2200

## 2016-07-22 NOTE — ED Notes (Signed)
IVC paperwork in process per EDP.

## 2016-07-22 NOTE — ED Triage Notes (Signed)
Patient c/o LUQ pain for couple days. Patient denies any n/v/d or constipation.

## 2016-07-22 NOTE — ED Notes (Addendum)
Patient refusing vital signs at this time. Assigned RN will re-assess at a later time.

## 2016-07-22 NOTE — ED Notes (Signed)
ED Provider at bedside. 

## 2016-07-22 NOTE — ED Notes (Signed)
Margaret Christian came to see pt and agreed there is no medication we can give IM for her her hypertension, and ordered re-assess BP in 3 hours due to pt being asymptomatic at this time. Pt would not open eyes for MD, or comply with assessment.

## 2016-07-22 NOTE — ED Notes (Signed)
Patient given water to drink.  

## 2016-07-22 NOTE — ED Triage Notes (Signed)
Patient's brother brought her in due to patient off her psych medications and for the past 3 days patient's 49 yo son has been messaging him that his mom is not acting right. This morning patient wondered off from house and has been driving and isnt supposed to be. patient was speaking out while in triage room but not when being spoken too about off the wall thoughts/ideas.

## 2016-07-22 NOTE — ED Provider Notes (Signed)
[facility]-EMER[facility]ENCY DEPT Provider Note   CSN: [id number] Arrival date & time: 07/22/16  1123    History   Chief Complaint Chief Complaint  Patient presents with  . Paranoid  . LUQ pain    HPI Margaret Christian is a 51 y.o. female who presents with left sided abdominal pain and psychosis. PMH significant for paranoid schizophrenia, bipolar I d/o, chronic left sided abdominal pain, hx of elevated BP. Left sided pain has been noted in the past without a diagnosis. LFTs in January were normal although lipase was not checked. Abdominal US in Jan was unremarkable. She denies vomiting, diarrhea/constipation, dysuria. On initial interview, patient is acutely psychotic and she is not answering my questions appropriately. Her brother Margaret Christian states that she has not slept in the past 3 days. She has been off all her medicines and wandering away from the house and driving when she is not supposed to. In addition she lives with her 49 year old son who told Margaret Christian that she has been having aggressive behavior towards him, threatening him and throwing glass at him. Level 5 caveat due to psych disorder  HPI  Past Medical History:  Diagnosis Date  . Hypertension   . Paranoid schizophrenia (HCC)   . Phlebitis     Patient Active Problem List   Diagnosis Date Noted  . Bipolar I disorder, most recent episode (or current) manic (HCC) 06/25/2015  . Paranoid schizophrenia (HCC)   . Schizophrenia (HCC) 06/20/2015  . URI (upper respiratory infection) 01/30/2013  . Prediabetes 01/30/2013  . Preventative health care 11/23/2012  . Numbness and tingling in hands 11/23/2012  . Elevated blood pressure reading without diagnosis of hypertension 10/31/2011  . Morbid obesity (HCC) 10/31/2011    Past Surgical History:  Procedure Laterality Date  . CESAREAN SECTION  2001    OB History    Gravida Para Term Preterm AB Living   0 0 0 0 0     SAB TAB Ectopic Multiple Live Births   0 0 0           Home  Medications    Prior to Admission medications   Medication Sig Start Date End Date Taking? Authorizing Provider  ARIPiprazole (ABILIFY) 20 MG tablet Take 1 tablet (20 mg total) by mouth daily. 06/25/15   Audery AmelJohn T Clapacs, MD  fish oil-omega-3 fatty acids 1000 MG capsule Take 2 g by mouth daily.    Historical Provider, MD  LORazepam (ATIVAN) 1 MG tablet Take 1 tablet (1 mg total) by mouth every 8 (eight) hours as needed for anxiety (agitation). 06/25/15   Audery AmelJohn T Clapacs, MD  Multiple Vitamin (MULITIVITAMIN WITH MINERALS) TABS Take 1 tablet by mouth daily.    Historical Provider, MD  zolpidem (AMBIEN) 5 MG tablet Take 1 tablet (5 mg total) by mouth at bedtime as needed for sleep. 06/25/15   Audery AmelJohn T Clapacs, MD    Family History Family History  Problem Relation Age of Onset  . Heart failure Father   . Hypertension Father   . Hypertension Brother   . Diabetes type II Paternal Grandfather   . Aortic aneurysm Maternal Grandfather   . Coronary artery disease Maternal Grandfather   . Stroke Maternal Grandfather     Social History Social History  Substance Use Topics  . Smoking status: Light Tobacco Smoker    Types: Cigarettes  . Smokeless tobacco: Not on file  . Alcohol use 0.0 oz/week     Allergies   Shellfish allergy and Shellfish allergy  Review of Systems Review of Systems  Unable to perform ROS: Psychiatric disorder     Physical Exam Updated Vital Signs BP (!) 162/102 (BP Location: Left Arm)   Pulse 109   Temp 98.5 F (36.9 C) (Oral)   Resp 18   SpO2 97%   Physical Exam  Constitutional: She is oriented to person, place, and time. She appears well-developed and well-nourished. No distress.  Obese, NAD  HENT:  Head: Normocephalic and atraumatic.  Dry mucous membranes  Eyes: Conjunctivae are normal. Pupils are equal, round, and reactive to light. Right eye exhibits no discharge. Left eye exhibits no discharge. No scleral icterus.  Neck: Normal range of motion.    Cardiovascular: Normal rate and regular rhythm.  Exam reveals no gallop and no friction rub.   No murmur heard. Pulmonary/Chest: Effort normal and breath sounds normal. No respiratory distress. She has no wheezes. She has no rales. She exhibits no tenderness.  Abdominal: Soft. Bowel sounds are normal. She exhibits no distension and no mass. There is no tenderness. There is no rebound and no guarding. No hernia.  Patient is non-tender on exam but points to her left side when asked where it hurts  Neurological: She is alert and oriented to person, place, and time.  Skin: Skin is warm and dry.  Psychiatric: Her mood appears anxious (mildly). Her speech is delayed. She is actively hallucinating. Thought content is paranoid and delusional. Cognition and memory are impaired. She expresses impulsivity and inappropriate judgment. She is inattentive.  Nursing note and vitals reviewed.   ED Treatments / Results  Labs (all labs ordered are listed, but only abnormal results are displayed) Labs Reviewed  COMPREHENSIVE METABOLIC PANEL - Abnormal; Notable for the following:       Result Value   Glucose, Bld 107 (*)    All other components within normal limits  ACETAMINOPHEN LEVEL - Abnormal; Notable for the following:    Acetaminophen (Tylenol), Serum <10 (*)    All other components within normal limits  CBC - Abnormal; Notable for the following:    WBC 13.5 (*)    RDW 16.3 (*)    Platelets 419 (*)    All other components within normal limits  URINALYSIS, ROUTINE W REFLEX MICROSCOPIC - Abnormal; Notable for the following:    APPearance HAZY (*)    Hgb urine dipstick SMALL (*)    Ketones, ur 20 (*)    Protein, ur 100 (*)    Bacteria, UA RARE (*)    Squamous Epithelial / LPF 0-5 (*)    All other components within normal limits  ETHANOL  SALICYLATE LEVEL  RAPID URINE DRUG SCREEN, HOSP PERFORMED  LIPASE, BLOOD  PREGNANCY, URINE  I-STAT BETA HCG BLOOD, ED ([facility], [facility], [facility] ONLY)    EKG  EKG  Interpretation None       Radiology No results found.  Procedures Procedures (including critical care time)  Medications Ordered in ED Medications  acetaminophen (TYLENOL) tablet 650 mg (not administered)  LORazepam (ATIVAN) injection 1 mg (0 mg Intramuscular Hold 07/22/16 2156)  hydrochlorothiazide (MICROZIDE) capsule 25 mg (not administered)  ziprasidone (GEODON) injection 20 mg (20 mg Intramuscular Given 07/22/16 1547)  LORazepam (ATIVAN) injection 2 mg (2 mg Intramuscular Given 07/22/16 1547)  diphenhydrAMINE (BENADRYL) injection 50 mg (50 mg Intramuscular Given 07/22/16 1547)  sterile water (preservative free) injection (  Given 07/22/16 1540)     Initial Impression / Assessment and Plan / ED Course  I have reviewed the triage vital  signs and the nursing notes.  Pertinent labs & imaging results that were available during my care of the patient were reviewed by me and considered in my medical decision making (see chart for details).  51 year old female presents with left sided abdominal pain and acute psychosis. She is hypertensive and mildly tachycardic. She has dry mucous membranes and may be dehydrated. CBC remarkable for leukocytosis which seems to be chronically elevated. Preg test is negative. CMP is unremarkable. Lipase is normal. Tylenol and ASA levels normal. ETOH negative. UA and UDS pending. TTS consult placed.  Final Clinical Impressions(s) / ED Diagnoses   Final diagnoses:  Paranoid schizophrenia [facility])    New Prescriptions New Prescriptions   No medications on file     Bethel Born, PA-C 07/22/16 [address] [place], PA-C 07/22/16 2158    Shaune Pollack, MD 07/23/16 [phone]

## 2016-07-22 NOTE — ED Notes (Signed)
Belongings moved to locker 28 

## 2016-07-22 NOTE — ED Notes (Signed)
MD Gwenlyn FudgeGoldstein notified of BP and elevated pulse, EDP will come round on the pa shortly

## 2016-07-22 NOTE — ED Notes (Signed)
SBAR Report received from previous nurse. Pt received calm and visible on unit. Pt resting and gave no meaningful answers to assessment questions related to  current SI/ HI, A/V H, depression, anxiety, or pain at this time, and appears otherwise stable and free of distress. Pt reminded of camera surveillance, q 15 min rounds, and rules of the milieu. Will continue to assess.

## 2016-07-23 ENCOUNTER — Encounter (HOSPITAL_COMMUNITY): Payer: Self-pay

## 2016-07-23 ENCOUNTER — Inpatient Hospital Stay (HOSPITAL_COMMUNITY)
Admission: AD | Admit: 2016-07-23 | Discharge: 2016-07-28 | DRG: 885 | Disposition: A | Discharge status: Home / Self Care | Payer: 59 | Source: Intra-hospital | Attending: Psychiatry | Admitting: Psychiatry

## 2016-07-23 DIAGNOSIS — R109 Unspecified abdominal pain: Secondary | ICD-10-CM | POA: Diagnosis present

## 2016-07-23 DIAGNOSIS — F250 Schizoaffective disorder, bipolar type: Secondary | ICD-10-CM | POA: Diagnosis present

## 2016-07-23 DIAGNOSIS — I10 Essential (primary) hypertension: Secondary | ICD-10-CM | POA: Diagnosis present

## 2016-07-23 DIAGNOSIS — Z79899 Other long term (current) drug therapy: Secondary | ICD-10-CM

## 2016-07-23 DIAGNOSIS — F17210 Nicotine dependence, cigarettes, uncomplicated: Secondary | ICD-10-CM | POA: Diagnosis present

## 2016-07-23 DIAGNOSIS — Z91013 Allergy to seafood: Secondary | ICD-10-CM

## 2016-07-23 DIAGNOSIS — Z8249 Family history of ischemic heart disease and other diseases of the circulatory system: Secondary | ICD-10-CM

## 2016-07-23 DIAGNOSIS — Z8672 Personal history of thrombophlebitis: Secondary | ICD-10-CM | POA: Diagnosis not present

## 2016-07-23 DIAGNOSIS — F200 Paranoid schizophrenia: Secondary | ICD-10-CM | POA: Diagnosis not present

## 2016-07-23 MED ORDER — OLANZAPINE 10 MG IM SOLR: 10.0000 mg | Freq: Two times a day (BID) | INTRAMUSCULAR | Status: DC | PRN

## 2016-07-23 MED ORDER — OLANZAPINE 10 MG PO TBDP: 10.0000 mg | ORAL_TABLET | Freq: Two times a day (BID) | ORAL | Status: DC | PRN

## 2016-07-23 MED ORDER — LORAZEPAM 1 MG PO TABS
1.0000 mg | ORAL_TABLET | Freq: Three times a day (TID) | ORAL | Status: DC | PRN
  Administered 2016-07-25 – 2016-07-27 (×3): 1 mg via ORAL
  Filled 2016-07-23 (×3): qty 1

## 2016-07-23 MED ORDER — CARBAMAZEPINE 200 MG PO TABS: 200.0000 mg | ORAL_TABLET | Freq: Two times a day (BID) | ORAL | Status: DC

## 2016-07-23 MED ORDER — ARIPIPRAZOLE 10 MG PO TABS
10.0000 mg | ORAL_TABLET | Freq: Every day | ORAL | Status: DC
  Administered 2016-07-24 – 2016-07-28 (×5): 10 mg via ORAL
  Filled 2016-07-23 (×7): qty 1

## 2016-07-23 MED ORDER — ACETAMINOPHEN 325 MG PO TABS
650.0000 mg | ORAL_TABLET | ORAL | Status: DC | PRN
  Administered 2016-07-24: 650 mg via ORAL
  Filled 2016-07-23: qty 2

## 2016-07-23 MED ORDER — TRAZODONE HCL 100 MG PO TABS: 100.0000 mg | ORAL_TABLET | Freq: Every evening | ORAL | Status: DC | PRN

## 2016-07-23 MED ORDER — ARIPIPRAZOLE 10 MG PO TABS: 20.0000 mg | ORAL_TABLET | Freq: Every day | ORAL | Status: DC

## 2016-07-23 MED ORDER — OLANZAPINE 10 MG PO TBDP: 10.0000 mg | ORAL_TABLET | Freq: Two times a day (BID) | ORAL | Status: DC

## 2016-07-23 MED ORDER — LORAZEPAM 1 MG PO TABS: 1.0000 mg | ORAL_TABLET | Freq: Three times a day (TID) | ORAL | Status: DC | PRN

## 2016-07-23 MED ORDER — ASENAPINE MALEATE 5 MG SL SUBL: 10.0000 mg | SUBLINGUAL_TABLET | Freq: Two times a day (BID) | SUBLINGUAL | Status: DC | PRN

## 2016-07-23 MED ORDER — ARIPIPRAZOLE 10 MG PO TABS
10.0000 mg | ORAL_TABLET | Freq: Every day | ORAL | Status: DC
  Administered 2016-07-23: 10 mg via ORAL
  Filled 2016-07-23: qty 1

## 2016-07-23 MED ORDER — MAGNESIUM HYDROXIDE 400 MG/5ML PO SUSP: 30.0000 mL | Freq: Every day | ORAL | Status: DC | PRN

## 2016-07-23 MED ORDER — OLANZAPINE 10 MG IM SOLR: 10.0000 mg | Freq: Two times a day (BID) | INTRAMUSCULAR | Status: DC

## 2016-07-23 MED ORDER — ALUM & MAG HYDROXIDE-SIMETH 200-200-20 MG/5ML PO SUSP: 30.0000 mL | ORAL | Status: DC | PRN

## 2016-07-23 MED ORDER — CARBAMAZEPINE 200 MG PO TABS
200.0000 mg | ORAL_TABLET | Freq: Two times a day (BID) | ORAL | Status: DC
  Administered 2016-07-23 – 2016-07-26 (×6): 200 mg via ORAL
  Filled 2016-07-23 (×9): qty 1

## 2016-07-23 NOTE — ED Notes (Signed)
Pt Xfer to 18 with SAPPU bed because pt would not get up. Holding room for pt

## 2016-07-23 NOTE — ED Notes (Signed)
Pt returned from medical ED bed with VS improved to within out parameters. Writer returned pt to the SAPPU in SAPPU bed. Pt still tangential and gave no meaningful answers to assessment questions. Will continue to assess.

## 2016-07-23 NOTE — Tx Team (Signed)
Initial Treatment Plan 07/23/2016 5:35 PM Margaret BachelorMarian Christian ZOX:[id number]    PATIENT STRESSORS: Health problems Other: family problems   PATIENT STRENGTHS: Capable of independent living Communication skills Supportive family/friends   PATIENT IDENTIFIED PROBLEMS: pyschosis  Anxiety   " Get better"  "Not  To be disoriented"               DISCHARGE CRITERIA:  Ability to meet basic life and health needs Improved stabilization in mood, thinking, and/or behavior Medical problems require only outpatient monitoring Motivation to continue treatment in a less acute level of care  PRELIMINARY DISCHARGE PLAN: Attend aftercare/continuing care group Attend PHP/IOP Attend 12-step recovery group Outpatient therapy  PATIENT/FAMILY INVOLVEMENT: This treatment plan has been presented to and reviewed with the patient, Margaret BachelorMarian Christian, and/or family member.  The patient and family have been given the opportunity to ask questions and make suggestions.  Margaret PunchesJane O Dicy Smigel, RN 07/23/2016, 5:35 PM

## 2016-07-23 NOTE — ED Notes (Signed)
Pt BP rea-assessed as 198/92 Kelby FamManuel, 2nd reading still hypertensive. Called [facility]) and received approval to sent pt back to medical side for BP stabelization. Notified Terri (ED charge) of the need to move pt, will receive call back with bed assignment.

## 2016-07-23 NOTE — Consult Note (Addendum)
[facility] Consult   Reason for Consult: manic behavior , aggressive behavior towards family Referring Physician:  EDP Patient Identification: Margaret Christian MRN:  [id number] Principal Diagnosis: Schizoaffective disorder, bipolar type [facility]) Diagnosis:   Patient Active Problem List   Diagnosis Date Noted  . Schizoaffective disorder, bipolar type ([place]) [F25.0] 07/23/2016    Priority: High  . Bipolar I disorder, most recent episode (or current) manic ([place]) [F31.10] 06/25/2015  . Paranoid schizophrenia ([place]) [F20.0]   . Schizophrenia ([place]) [F20.9] 06/20/2015  . URI (upper respiratory infection) [J06.9] 01/30/2013  . Prediabetes [R73.03] 01/30/2013  . Preventative health care [Z00.00] 11/23/2012  . Numbness and tingling in hands [R20.0, R20.2] 11/23/2012  . Elevated blood pressure reading without diagnosis of hypertension [R03.0] 10/31/2011  . Morbid obesity ([place]) [E66.01] 10/31/2011    Total Time spent with patient: 45 minutes  Subjective:   Margaret Christian is a 51 y.o. female patient admitted with aggressive/manic behavior.  HPI:  Margaret Christian is a 51 y.o. female with history of Paranoid schizophrenia and Bipolar manic Patient is acutely psychosis-talking to herself, delusional-refusing medications because she says "I have pancreatic cancer, I diagnosed myself because my uncle is a CFO of an hospital.'' Patient speech is pressured, tangential, her mood is labile, her behavior erratic and bizarre. Marland Kitchen Upon entering the room to speak to pt, pt just stares at clinician. Clinician introduces self and asks pt for her name. Patient reports that she stopped taking her medications and has been having racing thoughts and not sleeping for days. She denies drugs and alcohol abuse.  Past Psychiatric History: as above  Risk to Self: Suicidal Ideation: No Suicidal Intent: No Is patient at risk for suicide?: No Suicidal Plan?: No Access to Means: No Intentional Self Injurious  Behavior: None Risk to Others: Homicidal Ideation: No Thoughts of Harm to Others: No Current Homicidal Intent: No Current Homicidal Plan: No Access to Homicidal Means: No History of harm to others?: No Assessment of Violence: None Noted Does patient have access to weapons?: No Criminal Charges Pending?: No Does patient have a court date: No Prior Inpatient Therapy: Prior Inpatient Therapy: Yes Prior Therapy Dates: 06/2015 Prior Therapy Facilty/Provider(s): [facility] Reason for Treatment: Schizoaffective Prior Outpatient Therapy: Does patient have an ACCT team?: Unknown Does patient have Intensive In-House Services?  : No Does patient have [facility] services? : Unknown Does patient have P4CC services?: No  Past Medical History:  Past Medical History:  Diagnosis Date  . Hypertension   . Paranoid schizophrenia ([place])   . Phlebitis     Past Surgical History:  Procedure Laterality Date  . CESAREAN SECTION  2001   Family History:  Family History  Problem Relation Age of Onset  . Heart failure Father   . Hypertension Father   . Hypertension Brother   . Diabetes type II Paternal Grandfather   . Aortic aneurysm Maternal Grandfather   . Coronary artery disease Maternal Grandfather   . Stroke Maternal Grandfather    Family Psychiatric  History:  Social History:  History  Alcohol Use  . 0.0 oz/week     History  Drug Use No    Social History   Social History  . Marital status: Single    Spouse name: N/A  . Number of children: N/A  . Years of education: N/A   Social History Main Topics  . Smoking status: Light Tobacco Smoker    Types: Cigarettes  . Smokeless tobacco: None  . Alcohol use 0.0 oz/week  . Drug  use: No  . Sexual activity: Not Currently    Birth control/ protection: None   Other Topics Concern  . None   Social History Narrative   ** Merged History Encounter **       Single mom   Son - 64 y/o    Works at [employer] History:     Allergies:   Allergies  Allergen Reactions  . Shellfish Allergy Hives and Nausea And Vomiting    Labs:  Results for orders placed or performed during the hospital encounter of 07/22/16 (from the past 48 hour(s))  Rapid urine drug screen (hospital performed)     Status: None   Collection Time: 07/22/16 11:40 AM  Result Value Ref Range   Opiates NONE DETECTED NONE DETECTED   Cocaine NONE DETECTED NONE DETECTED   Benzodiazepines NONE DETECTED NONE DETECTED   Amphetamines NONE DETECTED NONE DETECTED   Tetrahydrocannabinol NONE DETECTED NONE DETECTED   Barbiturates NONE DETECTED NONE DETECTED    Comment:        DRUG SCREEN FOR MEDICAL PURPOSES ONLY.  IF CONFIRMATION IS NEEDED FOR ANY PURPOSE, NOTIFY LAB WITHIN 5 DAYS.        LOWEST DETECTABLE LIMITS FOR URINE DRUG SCREEN Drug Class       Cutoff (ng/mL) Amphetamine      1000 Barbiturate      200 Benzodiazepine   998 Tricyclics       338 Opiates          300 Cocaine          300 THC              50   Comprehensive metabolic panel     Status: Abnormal   Collection Time: 07/22/16 12:08 PM  Result Value Ref Range   Sodium 140 135 - 145 mmol/L   Potassium 3.6 3.5 - 5.1 mmol/L   Chloride 106 101 - 111 mmol/L   CO2 26 22 - 32 mmol/L   Glucose, Bld 107 (H) 65 - 99 mg/dL   BUN 16 6 - 20 mg/dL   Creatinine, Ser 0.92 0.44 - 1.00 mg/dL   Calcium 9.3 8.9 - 10.3 mg/dL   Total Protein 8.1 6.5 - 8.1 g/dL   Albumin 4.7 3.5 - 5.0 g/dL   AST 19 15 - 41 U/L   ALT 20 14 - 54 U/L   Alkaline Phosphatase 56 38 - 126 U/L   Total Bilirubin 1.0 0.3 - 1.2 mg/dL   GFR calc non Af Amer >60 >60 mL/min   GFR calc Af Amer >60 >60 mL/min    Comment: (NOTE) The eGFR has been calculated using the CKD EPI equation. This calculation has not been validated in all clinical situations. eGFR's persistently <60 mL/min signify possible Chronic Kidney Disease.    Anion gap 8 5 - 15  Ethanol     Status: None   Collection Time: 07/22/16 12:08 PM  Result  Value Ref Range   Alcohol, Ethyl (B) <5 <5 mg/dL    Comment:        LOWEST DETECTABLE LIMIT FOR SERUM ALCOHOL IS 5 mg/dL FOR MEDICAL PURPOSES ONLY   Salicylate level     Status: None   Collection Time: 07/22/16 12:08 PM  Result Value Ref Range   Salicylate Lvl <2.5 2.8 - 30.0 mg/dL  Acetaminophen level     Status: Abnormal   Collection Time: 07/22/16 12:08 PM  Result Value Ref Range   Acetaminophen (Tylenol), Serum <  10 (L) 10 - 30 ug/mL    Comment:        THERAPEUTIC CONCENTRATIONS VARY SIGNIFICANTLY. A RANGE OF 10-30 ug/mL MAY BE AN EFFECTIVE CONCENTRATION FOR MANY PATIENTS. HOWEVER, SOME ARE BEST TREATED AT CONCENTRATIONS OUTSIDE THIS RANGE. ACETAMINOPHEN CONCENTRATIONS >150 ug/mL AT 4 HOURS AFTER INGESTION AND >50 ug/mL AT 12 HOURS AFTER INGESTION ARE OFTEN ASSOCIATED WITH TOXIC REACTIONS.   cbc     Status: Abnormal   Collection Time: 07/22/16 12:08 PM  Result Value Ref Range   WBC 13.5 (H) 4.0 - 10.5 K/uL   RBC 4.72 3.87 - 5.11 MIL/uL   Hemoglobin 12.7 12.0 - 15.0 g/dL   HCT 39.2 36.0 - 46.0 %   MCV 83.1 78.0 - 100.0 fL   MCH 26.9 26.0 - 34.0 pg   MCHC 32.4 30.0 - 36.0 g/dL   RDW 16.3 (H) 11.5 - 15.5 %   Platelets 419 (H) 150 - 400 K/uL  Lipase, blood     Status: None   Collection Time: 07/22/16 12:08 PM  Result Value Ref Range   Lipase 28 11 - 51 U/L  Urinalysis, Routine w reflex microscopic     Status: Abnormal   Collection Time: 07/22/16 12:14 PM  Result Value Ref Range   Color, Urine YELLOW YELLOW   APPearance HAZY (A) CLEAR   Specific Gravity, Urine 1.024 1.005 - 1.030   pH 5.0 5.0 - 8.0   Glucose, UA NEGATIVE NEGATIVE mg/dL   Hgb urine dipstick SMALL (A) NEGATIVE   Bilirubin Urine NEGATIVE NEGATIVE   Ketones, ur 20 (A) NEGATIVE mg/dL   Protein, ur 100 (A) NEGATIVE mg/dL   Nitrite NEGATIVE NEGATIVE   Leukocytes, UA NEGATIVE NEGATIVE   RBC / HPF 0-5 0 - 5 RBC/hpf   WBC, UA 6-30 0 - 5 WBC/hpf   Bacteria, UA RARE (A) NONE SEEN   Squamous Epithelial  / LPF 0-5 (A) NONE SEEN   Mucous PRESENT    Hyaline Casts, UA PRESENT   Pregnancy, urine     Status: None   Collection Time: 07/22/16 12:14 PM  Result Value Ref Range   Preg Test, Ur NEGATIVE NEGATIVE    Comment:        THE SENSITIVITY OF THIS METHODOLOGY IS >20 mIU/mL.   I-Stat beta hCG blood, ED     Status: None   Collection Time: 07/22/16 12:19 PM  Result Value Ref Range   I-stat hCG, quantitative <5.0 <5 mIU/mL   Comment 3            Comment:   GEST. AGE      CONC.  (mIU/mL)   <=1 WEEK        5 - 50     2 WEEKS       50 - 500     3 WEEKS       100 - 10,000     4 WEEKS     1,000 - 30,000        FEMALE AND NON-PREGNANT FEMALE:     LESS THAN 5 mIU/mL     Current Facility-Administered Medications  Medication Dose Route Frequency Provider Last Rate Last Dose  . acetaminophen (TYLENOL) tablet 650 mg  650 mg Oral Q4H PRN Recardo Evangelist, PA-C      . ARIPiprazole (ABILIFY) tablet 10 mg  10 mg Oral Daily Vasiliki Smaldone, MD      . carbamazepine (TEGRETOL) tablet 200 mg  200 mg Oral BID PC Corena Pilgrim, MD      .  LORazepam (ATIVAN) tablet 1 mg  1 mg Oral Q8H PRN Draven Natter, MD      . traZODone (DESYREL) tablet 100 mg  100 mg Oral QHS PRN Corena Pilgrim, MD       Current Outpatient Prescriptions  Medication Sig Dispense Refill  . ARIPiprazole (ABILIFY) 20 MG tablet Take 1 tablet (20 mg total) by mouth daily. 30 tablet 0  . fish oil-omega-3 fatty acids 1000 MG capsule Take 2 g by mouth daily.    Marland Kitchen LORazepam (ATIVAN) 1 MG tablet Take 1 tablet (1 mg total) by mouth every 8 (eight) hours as needed for anxiety (agitation). 10 tablet 0  . Multiple Vitamin (MULITIVITAMIN WITH MINERALS) TABS Take 1 tablet by mouth daily.    Marland Kitchen zolpidem (AMBIEN) 5 MG tablet Take 1 tablet (5 mg total) by mouth at bedtime as needed for sleep. 30 tablet 0    Musculoskeletal: Strength & Muscle Tone: within normal limits Gait & Station: normal Patient leans: N/A  Psychiatric Specialty  Exam: Physical Exam  Psychiatric: Her affect is angry and labile. Her speech is rapid and/or pressured and tangential. She is agitated, aggressive, actively hallucinating and combative. Thought content is paranoid and delusional. Cognition and memory are normal. She expresses impulsivity.    Review of Systems  Constitutional: Negative.   HENT: Negative.   Eyes: Negative.   Respiratory: Negative.   Cardiovascular: Negative.   Gastrointestinal: Negative.   Genitourinary: Negative.   Musculoskeletal: Negative.   Skin: Negative.   Neurological: Negative.   Endo/Heme/Allergies: Negative.   Psychiatric/Behavioral: Positive for hallucinations. The patient is nervous/anxious and has insomnia.     Blood pressure 159/96, pulse 86, temperature 98.9 F (37.2 C), temperature source Oral, resp. rate 17, SpO2 99 %.There is no height or weight on file to calculate BMI.  General Appearance: Disheveled  Eye Contact:  Good  Speech:  Pressured  Volume:  Increased  Mood:  Irritable  Affect:  Labile and Full Range  Thought Process:  Disorganized  Orientation:  Full (Time, Place, and Person)  Thought Content:  Illogical and Delusions  Suicidal Thoughts:  No  Homicidal Thoughts:  No  Memory:  Immediate;   Fair Recent;   Fair Remote;   Fair  Judgement:  Poor  Insight:  Lacking  Psychomotor Activity:  Increased  Concentration:  Concentration: Poor and Attention Span: Poor  Recall:  [employer] of Knowledge:  Fair  Language:  Good  Akathisia:  No  Handed:  Right  AIMS (if indicated):     Assets:  Communication Skills Housing Social Support  ADL's:  Intact  Cognition:  WNL  Sleep:   poor     Treatment Plan Summary: Daily contact with patient to assess and evaluate symptoms and progress in treatment and Medication management  Start Tegretol 241m bid for bipolar Start Abilify 10 mg daily for psychosis/delusions Start Trazodone 100 mg qhs as needed for insomnia Patient will need forced  medication due to the acuity of her symptoms-she continues to refuse oral medications.  Disposition: Recommend psychiatric Inpatient admission when medically cleared. Supportive therapy provided about ongoing stressors.  ACorena Pilgrim MD 07/23/2016 10:40 AM

## 2016-07-23 NOTE — Progress Notes (Signed)
Keenan BachelorMarian Christian is a 51 y.o. female  [facility] On admission, pt was very disorganized, irritable and argumentative and not willing to follow directions. Pt was able to answers question after multiple redirections. Pt stated she is not sure why she in the hospital, that her brother put her here. Consents signed, skin/belongings search completed and pt oriented to unit. Pt stable at this time. Pt given the opportunity to express concerns and ask questions. Pt given toiletries. Will continue to monitor.

## 2016-07-23 NOTE — Progress Notes (Signed)
07/23/16 1336:  LRT went to pt room to offer activities, pt was sleep.  Yaneisy Wenz, LRT/CTRS 

## 2016-07-23 NOTE — Progress Notes (Signed)
Writer has observed patient up in the dayroom writing in her handbook and has asked other patients their names and family members names to write in her handbook. She has stood at the nursing station talking to herself while staring at Clinical research associatewriter and stated " you know what I'm talking about." She was pulling on the door wanting to go and yelling that her son was in danger and she had witnessed a gruesome murder. She was offered medication but refused to take anything to calm her. Writer informed her that she could not be in the hallway yelling and if needing to check on her family she could use the phone. She returned to her room and laid down to rest. Safety maintained on unit with 15 min checks.

## 2016-07-23 NOTE — BH Assessment (Signed)
[facility] Assessment Progress Note  Per Thedore MinsMojeed Akintayo, MD, this pt requires psychiatric hospitalization.  Berneice Heinrichina Tate, RN, [facility]  Pt presents under IVC initiated by EDP Shaune Pollackameron Isaacs, MD, and [facility].  Pt's nurse, Kendal Hymendie, has been notified, and agrees to call report to [phone].  Pt is to be transported via [employer] enforcement.   Doylene Canninghomas Reniyah Gootee, MA Triage Specialist [phone]

## 2016-07-23 NOTE — ED Notes (Signed)
Bed: [id number] Expected date:  Expected time:  Means of arrival:  Comments: [facility] PT

## 2016-07-23 NOTE — Progress Notes (Signed)
Adult Psychoeducational Group Note  Date:  07/23/2016 Time:  9:14 PM  Group Topic/Focus:  Wrap-Up Group:   The focus of this group is to help patients review their daily goal of treatment and discuss progress on daily workbooks.  Participation Level:  Active  Participation Quality:  Appropriate  Affect:  Appropriate  Cognitive:  Appropriate  Insight: Appropriate  Engagement in Group:  Engaged  Modes of Intervention:  Discussion  Additional Comments:  The patient expressed she attended groups.The patient also said that she rates today a 5.  Octavio Mannshigpen, Lynsi Dooner Lee 07/23/2016, 9:14 PM

## 2016-07-23 NOTE — ED Notes (Signed)
Pt in hall interacting with hallucinations.

## 2016-07-23 NOTE — ED Provider Notes (Signed)
  Physical Exam  BP 159/96 (BP Location: Left Arm)   Pulse 86   Temp 98.9 F (37.2 C) (Oral)   Resp 17   SpO2 99%   Physical Exam  ED Course  Procedures  MDM I was called by Dr. Jannifer FranklinAkintayo regarding getting 2 physician consent for medication administration. Patient has persistent hallucinations and thinks that she is pregnant and that she has pancreatitis. Labs including HCG and lipase were normal yesterday. Ordered BP meds but refused. She is under IVC. Patient is hallucinating and has no capacity. Patient needs to get psych and BP meds as prescribed. Not agitated so will hold off on IM meds.    Charlynne Panderavid Hsienta Yao, MD 07/23/16 1201

## 2016-07-23 NOTE — ED Notes (Signed)
Pt discharged ambulatory with [employer].  All belongings were sent with patient. 

## 2016-07-23 NOTE — ED Notes (Signed)
[facility] Expected date:  Expected time:  Means of arrival:  Comments[facility]

## 2016-07-23 NOTE — ED Notes (Signed)
Pt re-directed for singing loudly in her room, re-direction had no effect, room door closed for noise level.

## 2016-07-24 ENCOUNTER — Encounter (HOSPITAL_COMMUNITY): Payer: Self-pay | Admitting: Registered Nurse

## 2016-07-24 DIAGNOSIS — Z91013 Allergy to seafood: Secondary | ICD-10-CM

## 2016-07-24 DIAGNOSIS — Z79899 Other long term (current) drug therapy: Secondary | ICD-10-CM

## 2016-07-24 DIAGNOSIS — F250 Schizoaffective disorder, bipolar type: Principal | ICD-10-CM
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
## 2016-07-24 NOTE — Progress Notes (Signed)
Writer went to patients room and observed her lying in bed resting. In report writer was informed that she did not feel well and wanted to go to bed early. She reports that she is fine and was about to fall off to sleep. She is currently isolative to her room and wants to rest. Safety maintained on unit with 15 min checks.

## 2016-07-24 NOTE — Progress Notes (Signed)
D:Pt is paranoid and worried about her son. Pt talked about her son being with her brother and mentioned a murder. Pt is tangential and difficult to follow in a conversation. Pt talked about not taking her medications because of her pancreas. Pt took her prescribed medications without difficulty. A:Offered support, encouragement and redirection.  R:Safety maintained on the unit.

## 2016-07-24 NOTE — [facility] Suicide Risk Assessment (Signed)
[facility] INPATIENT:  Family/Significant Other Suicide Prevention Education  Suicide Prevention Education:  Patient Refusal for Family/Significant Other Suicide Prevention Education: The patient Margaret Christian has refused to provide written consent for family/significant other to be provided Family/Significant Other Suicide Prevention Education during admission and/or prior to discharge.  Physician notified.  Beverly Sessionsywan J Cecia Egge 07/24/2016, 5:29 PM

## 2016-07-24 NOTE — Plan of Care (Signed)
Problem: Education: Goal: Emotional status will improve Outcome: Not Progressing Pt is emotional talking about her son and her pancreas.
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
## 2016-07-24 NOTE — [facility] Group Notes (Signed)
[facility] Group Notes:  (Clinical Social Work)  07/24/2016  11:15-12:00PM  Summary of Progress/Problems:   Today's process group involved patients discussing their feelings related to being hospitalized, as well as benefits they see to being in the hospital.  These were itemized on the whiteboard, and then the group brainstormed specific ways in which they could seek for those same benefits to happen when they discharge and go back home. The patient expressed a primary feeling about being hospitalized is "trapped and exasperated, I have bills to pay."  She was irritable and did not really talk about how to stay out of the hospital.  She seemed irritable throughout the discussion, kept sighing deeply and looking at the clock.  Type of Therapy:  Group Therapy - Process  Participation Level:  Minimal  Participation Quality:  Resistant  Affect:  Blunted and Irritable  Cognitive:  Disorganized  Insight:  Poor  Engagement in Therapy:  Poor  Modes of Intervention:  Exploration, Discussion  Ambrose MantleMareida Grossman-Orr, LCSW 07/24/2016, 12:58 PM

## 2016-07-25 NOTE — Progress Notes (Signed)
Speech is logical and goal oriented, patient denies SI/AVH, has been present and interactive on the unit.

## 2016-07-25 NOTE — Progress Notes (Signed)
At the beginning of the shift, pt was talking on the phone and was rather angry and irate at the person on the other end of the conversation.  After pt got off the phone, writer went to introduce self and speak with her about her day.  She said the she had just gotten off the phone with her mother who was supposed to bring patient some clothes.  Pt said mother was making excuses for not being able to bring her belongings.  She was upset that mother and brother felt it was more important to do what they were doing than to bring her what she needed.  Pt reported that it was otherwise an "ok" day.  She chose to take a shower instead of going to group.  Her BP was elevated this evening, but it was checked shortly after the phone call.  Pt requested and received an Ativan 1 mg this evening.  She had no other meds scheduled.  Pt was cooperative and pleasant with Clinical research associatewriter.  Pt denies SI/HI/AVH.  Support and encouragement offered.  Discharge plans are in process.  Safety maintained with q15 minute checks.

## 2016-07-25 NOTE — [facility] Group Notes (Signed)
[facility] Group Notes:  (Clinical Social Work)  07/25/2016  11:00AM-12:00PM  Summary of Progress/Problems:  The main focus of today's process group was to listen to a variety of genres of music and to identify that different types of music provoke different responses.  The patient then was able to identify personally what was soothing for them, as well as energizing, as well as how patient can personally use this knowledge in sleep habits, with depression, and with other symptoms.  The patient expressed at the beginning of group the overall feeling of good, and she enjoyed the group, danced and interacted quite a bit.  During group she brought CSW a long, meandering "poem" to read and kept making negative statements about herself while CSW tried to read it.  Type of Therapy:  Music Therapy   Participation Level:  Active  Participation Quality:  Attentive and Sharing  Affect:  Blunted  Cognitive:  Disorganized  Insight:  Improving  Engagement in Therapy:  Engaged  Modes of Intervention:   Activity, Exploration  Ambrose Mantle, LCSW 07/25/2016

## 2016-07-25 NOTE — Progress Notes (Signed)
[facility] MD Progress Note  07/25/2016 12:28 PM Margaret Christian  MRN:  [id number]    Subjective:  Patient states that she is feeling fuzzy headed from the medications "Just my body getting use to them."  Reports that she has spoke with her mother and brother this morning and neither will listen to what she has to say.  Reports that she is eating without difficulty; "Sleep is not good but not bad; it's better"  Denies suicidal/homicidal ideation, psychosis, and paranoia  Objective:  Patient seen, chart reviewed, and discussed with treatment team Today patient doesn't appear to be as irritated until she starts to talk about her family (mother, brother, and her son).   Patient is tolerating medications, slight improvement in sleep, eating without difficulty.    Principal Problem: Schizoaffective disorder, bipolar type (HCC) Diagnosis:   Patient Active Problem List   Diagnosis Date Noted  . Schizoaffective disorder, bipolar type (HCC) [F25.0] 07/23/2016  . Paranoid schizophrenia (HCC) [F20.0]   . Schizophrenia (HCC) [F20.9] 06/20/2015  . URI (upper respiratory infection) [J06.9] 01/30/2013  . Prediabetes [R73.03] 01/30/2013  . Preventative health care [Z00.00] 11/23/2012  . Numbness and tingling in hands [R20.0, R20.2] 11/23/2012  . Elevated blood pressure reading without diagnosis of hypertension [R03.0] 10/31/2011  . Morbid obesity (HCC) [E66.01] 10/31/2011   Total Time spent with patient: 20 minutes  Past Psychiatric History: See H&P  Past Medical History:  Past Medical History:  Diagnosis Date  . Hypertension   . Paranoid schizophrenia (HCC)   . Phlebitis     Past Surgical History:  Procedure Laterality Date  . CESAREAN SECTION  2001   Family History:  Family History  Problem Relation Age of Onset  . Heart failure Father   . Hypertension Father   . Hypertension Brother   . Diabetes type II Paternal Grandfather   . Aortic aneurysm Maternal Grandfather   . Coronary artery  disease Maternal Grandfather   . Stroke Maternal Grandfather    Family Psychiatric  History: See H&P Social History:  History  Alcohol Use  . 0.0 oz/week     History  Drug Use No    Social History   Social History  . Marital status: Single    Spouse name: N/A  . Number of children: N/A  . Years of education: N/A   Social History Main Topics  . Smoking status: Light Tobacco Smoker    Types: Cigarettes  . Smokeless tobacco: Never Used  . Alcohol use 0.0 oz/week  . Drug use: No  . Sexual activity: Not Currently    Birth control/ protection: None   Other Topics Concern  . None   Social History Narrative   ** Merged History Encounter **       Single mom   Son - 83 y/o    Works at [employer]   Additional Social History:    Pain Medications: see PTA list Prescriptions: See PTA list Over the Counter: see PTA list History of alcohol / drug use?: No history of alcohol / drug abuse    Sleep: Poor,Improving  Appetite:  Good  Current Medications: Current Facility-Administered Medications  Medication Dose Route Frequency Provider Last Rate Last Dose  . acetaminophen (TYLENOL) tablet 650 mg  650 mg Oral Q4H PRN Charm Rings, NP   650 mg at 07/24/16 1539  . alum & mag hydroxide-simeth (MAALOX/MYLANTA) 200-200-20 MG/5ML suspension 30 mL  30 mL Oral Q4H PRN Charm Rings, NP      .  ARIPiprazole (ABILIFY) tablet 10 mg  10 mg Oral Daily Charm Rings, NP   10 mg at 07/25/16 4098  . carbamazepine (TEGRETOL) tablet 200 mg  200 mg Oral BID PC Charm Rings, NP   200 mg at 07/25/16 1191  . LORazepam (ATIVAN) tablet 1 mg  1 mg Oral Q8H PRN Charm Rings, NP      . magnesium hydroxide (MILK OF MAGNESIA) suspension 30 mL  30 mL Oral Daily PRN Charm Rings, NP      . OLANZapine zydis (ZYPREXA) disintegrating tablet 10 mg  10 mg Oral BID PRN Charm Rings, NP       Or  . OLANZapine (ZYPREXA) injection 10 mg  10 mg Intramuscular BID PRN Charm Rings, NP        Lab  Results: No results found for this or any previous visit (from the past 48 hour(s)).  Blood Alcohol level:  Lab Results  Component Value Date   [facility] <5 07/22/2016   ETH <5 06/18/2015    Metabolic Disorder Labs: Lab Results  Component Value Date   HGBA1C 5.4 06/20/2015   Lab Results  Component Value Date   PROLACTIN 31.2 (H) 06/20/2015   Lab Results  Component Value Date   CHOL 201 (H) 06/20/2015   TRIG 168 (H) 06/20/2015   HDL 26 (L) 06/20/2015   CHOLHDL 7.7 06/20/2015   VLDL 34 06/20/2015   LDLCALC 141 (H) 06/20/2015    Physical Findings: AIMS: Facial and Oral Movements Muscles of Facial Expression: None, normal Lips and Perioral Area: None, normal Jaw: None, normal Tongue: None, normal,Extremity Movements Upper (arms, wrists, hands, fingers): None, normal Lower (legs, knees, ankles, toes): None, normal, Trunk Movements Neck, shoulders, hips: None, normal, Overall Severity Severity of abnormal movements (highest score from questions above): None, normal Incapacitation due to abnormal movements: None, normal Patient's awareness of abnormal movements (rate only patient's report): No Awareness, Dental Status Current problems with teeth and/or dentures?: No Does patient usually wear dentures?: No  CIWA:  CIWA-Ar Total: 3 COWS:  COWS Total Score: 4  Musculoskeletal: Strength & Muscle Tone: within normal limits Gait & Station: normal Patient leans: N/A  Psychiatric Specialty Exam: Physical Exam  ROS  Blood pressure (!) 139/111, pulse 90, temperature 98.6 F (37 C), temperature source Oral, resp. rate 20, height 5\' 4"  (1.626 m), weight 99.8 kg (220 lb), SpO2 98 %.Body mass index is 37.76 kg/m.  General Appearance: Fairly Groomed  Eye Contact:  Good  Speech:  Clear and Coherent and Normal Rate  Volume:  Normal  Mood:  Anxious and Depressed  Affect:  Depressed  Thought Process:  Disorganized and Descriptions of Associations: Tangential  Orientation:  Full (Time,  Place, and Person)  Thought Content:  Rumination  Suicidal Thoughts:  No  Homicidal Thoughts:  No  Memory:  Immediate;   Good Recent;   Good Remote;   Good  Judgement:  Impaired  Insight:  Lacking  Psychomotor Activity:  Normal  Concentration:  Concentration: Fair and Attention Span: Fair  Recall:  Good  Fund of Knowledge:  Good  Language:  Good  Akathisia:  No  Handed:  Right  AIMS (if indicated):     Assets:  Communication Skills Resilience Social Support  ADL's:  Intact  Cognition:  WNL  Sleep:  Number of Hours: 4.5     Treatment Plan Summary: Daily contact with patient to assess and evaluate symptoms and progress in treatment and Medication management  Continue  with current treatment plan Observation Level/Precautions:  15 minute checks  Laboratory:  CBC Chemistry Profile UDS UA  Psychotherapy:  Individual and group therapy sessions  Medications:  Abilify 10 mg  Schizoaffective disorder Daily; Tegretol 200 mg Bid mood stabilizer ; Ativan 1 g prn Q 8hr for agitation/anxiety  Consultations: as appropriate   Discharge Concerns:  Safety, stabilization, and access to medication  Estimated LOS: 5-7 days  Other:       Rankin, Denice BorsShuvon, NP 07/25/2016, 12:28 PM

## 2016-07-25 NOTE — Progress Notes (Signed)
Adult Psychoeducational Group Note  Date:  07/25/2016 Time:  8:42 PM  Group Topic/Focus:  Wrap-Up Group:   The focus of this group is to help patients review their daily goal of treatment and discuss progress on daily workbooks.  Participation Level:  Did Not Attend  Participation Quality:  Did not attend  Affect:  Did not attend  Cognitive:  Did not attend  Insight: None  Engagement in Group:  Did not attend  Modes of Intervention:  Did not attend  Additional Comments:  Patient did not attend wrap up group this evening.   Margaret Christian 07/25/2016, 8:42 PM

## 2016-07-26 ENCOUNTER — Other Ambulatory Visit: Payer: Self-pay

## 2016-07-26 DIAGNOSIS — F17210 Nicotine dependence, cigarettes, uncomplicated: Secondary | ICD-10-CM

## 2016-07-26 MED ORDER — PANTOPRAZOLE SODIUM 40 MG PO TBEC
40.0000 mg | DELAYED_RELEASE_TABLET | Freq: Two times a day (BID) | ORAL | Status: DC
  Administered 2016-07-26 – 2016-07-28 (×3): 40 mg via ORAL
  Filled 2016-07-26 (×6): qty 1

## 2016-07-26 MED ORDER — CARBAMAZEPINE 100 MG PO CHEW
100.0000 mg | CHEWABLE_TABLET | Freq: Two times a day (BID) | ORAL | Status: DC
  Administered 2016-07-26 – 2016-07-28 (×4): 100 mg via ORAL
  Filled 2016-07-26 (×6): qty 1

## 2016-07-26 MED ORDER — DOCUSATE SODIUM 100 MG PO CAPS: 100.0000 mg | ORAL_CAPSULE | Freq: Two times a day (BID) | ORAL | Status: DC | PRN

## 2016-07-26 MED ORDER — HYDRALAZINE HCL 10 MG PO TABS
10.0000 mg | ORAL_TABLET | Freq: Four times a day (QID) | ORAL | Status: DC | PRN
Start: 2016-07-26 — End: 2016-07-28

## 2016-07-26 MED ORDER — AMLODIPINE BESYLATE 5 MG PO TABS
5.0000 mg | ORAL_TABLET | Freq: Every day | ORAL | Status: DC
  Administered 2016-07-27 – 2016-07-28 (×2): 5 mg via ORAL
  Filled 2016-07-26 (×3): qty 1

## 2016-07-26 NOTE — Progress Notes (Signed)
Recreation Therapy Notes  INPATIENT RECREATION THERAPY ASSESSMENT  Patient Details Name: Keenan BachelorMarian Meara MRN: [id number] DOB: 07-02-1965 Today's Date: 07/26/2016  Patient Stressors: Other (Comment) (Financial)  Pt stated she was here for disorientation and high blood pressure.  Coping Skills:   Exercise, Art/Dance, Talking, Music  Personal Challenges: Communication, Concentration, Self-Esteem/Confidence, Social Interaction, Stress Management  Leisure Interests (2+):  Nature - Art therapistlower gardening, Social - Family, Garment/textile technologistCommunity - Counselling psychologistMovies, Individual - Other (Comment) Adriana Simas([place])  Awareness of Community Resources:  Yes  Community Resources:  Research scientist (physical sciences)Movie Theaters, Engineering geologistLibrary, [employer], Other (Comment) (Shopping center)  Current Use: Yes  Patient Strengths:  Listening, patience  Patient Identified Areas of Improvement:  Evaluating trustworthiness of others  Current Recreation Participation:  Weekly  Patient Goal for Hospitalization:  "Get to where I can think clearly and function normally"  [place]  [place]idence[place]  Current SI (including self-harm):  No  Current HI:  No  Consent to Intern Participation: N/A   Caroll RancherMarjette Seville Brick, LRT/CTRS  Caroll RancherLindsay, Enzley Kitchens A 07/26/2016, 2:12 PM

## 2016-07-26 NOTE — Progress Notes (Signed)
DAR NOTE: Patient presents with anxious affect and mood.  Denies auditory and visual hallucinations.  Described energy level as high and concentration as good.  Rates depression at 0, hopelessness at 0, and anxiety at 4.  Report dry mouth and sedation.  MD aware of complaints.  Maintained on routine safety checks.  Medications given as prescribed.  Support and encouragement offered as needed.  Attended group and participated.  States goal for today is "getting better focus."  Patient observed socializing with peers in the dayroom.

## 2016-07-26 NOTE — Progress Notes (Signed)
Adult Psychoeducational Group Note  Date:  07/26/2016 Time:  8:58 PM  Group Topic/Focus:  Wrap-Up Group:   The focus of this group is to help patients review their daily goal of treatment and discuss progress on daily workbooks.  Participation Level:  Active  Participation Quality:  Appropriate  Affect:  Appropriate  Cognitive:  Appropriate  Insight: Appropriate  Engagement in Group:  Engaged  Modes of Intervention:  Discussion  Additional Comments:  The patient expressed that she attended all groups.The patient also said that she feels her medication are correct now.  Octavio Mannshigpen, Jahziah Simonin Lee 07/26/2016, 8:58 PM

## 2016-07-26 NOTE — Progress Notes (Signed)
[facility] MD Progress Note  07/26/2016 12:04 PM Margaret Christian  MRN:  [id number] Subjective:  Patient states " I feel like the new medication is making me lose my concentration. I feel kind of tired. I also has abdominal pain on my side , I wonder if I have pancreatic problems.'  Objective: Patient seen and chart reviewed.Discussed patient with treatment team.  Pt seen with abdominal pain on her left side , states it gets worse when she is nervous. Pt states medications as ok, however may be tegretol is causing her to slow down. Discussed readjusting her dose. Will offer symptomatic treatment for abdominal pain,, it is chronic, will get work up and refer to PMD.   Principal Problem: Schizoaffective disorder, bipolar type ([place]) Diagnosis:   Patient Active Problem List   Diagnosis Date Noted  . Schizoaffective disorder, bipolar type ([place]) [F25.0] 07/23/2016  . Paranoid schizophrenia ([place]) [F20.0]   . Schizophrenia ([place]) [F20.9] 06/20/2015  . URI (upper respiratory infection) [J06.9] 01/30/2013  . Prediabetes [R73.03] 01/30/2013  . Preventative health care [Z00.00] 11/23/2012  . Numbness and tingling in hands [R20.0, R20.2] 11/23/2012  . Elevated blood pressure reading without diagnosis of hypertension [R03.0] 10/31/2011  . Morbid obesity ([place]) [E66.01] 10/31/2011   Total Time spent with patient: 25 MINUTES  Past Psychiatric History: Please see H&P.   Past Medical History:  Past Medical History:  Diagnosis Date  . Hypertension   . Paranoid schizophrenia ([place])   . Phlebitis     Past Surgical History:  Procedure Laterality Date  . CESAREAN SECTION  2001   Family History:  Family History  Problem Relation Age of Onset  . Heart failure Father   . Hypertension Father   . Hypertension Brother   . Diabetes type II Paternal Grandfather   . Aortic aneurysm Maternal Grandfather   . Coronary artery disease Maternal Grandfather   . Stroke Maternal Grandfather    Family Psychiatric   History: Please see H&P.  Social History: Please see H&P.  History  Alcohol Use  . 0.0 oz/week     History  Drug Use No    Social History   Social History  . Marital status: Single    Spouse name: N/A  . Number of children: N/A  . Years of education: N/A   Social History Main Topics  . Smoking status: Light Tobacco Smoker    Types: Cigarettes  . Smokeless tobacco: Never Used  . Alcohol use 0.0 oz/week  . Drug use: No  . Sexual activity: Not Currently    Birth control/ protection: None   Other Topics Concern  . None   Social History Narrative   ** Merged History Encounter **       Single mom   Son - 63 y/o    Works at [employer] History:    Pain Medications: see PTA list Prescriptions: See PTA list Over the Counter: see PTA list History of alcohol / drug use?: No history of alcohol / drug abuse                    Sleep: Fair  Appetite:  Fair  Current Medications: Current Facility-Administered Medications  Medication Dose Route Frequency Provider Last Rate Last Dose  . acetaminophen (TYLENOL) tablet 650 mg  650 mg Oral Q4H PRN Patrecia Pour, NP   650 mg at 07/24/16 1539  . alum & mag hydroxide-simeth (MAALOX/MYLANTA) 200-200-20 MG/5ML suspension 30 mL  30 mL Oral Q4H  PRN Patrecia Pour, NP      . ARIPiprazole (ABILIFY) tablet 10 mg  10 mg Oral Daily Patrecia Pour, NP   10 mg at 07/26/16 0802  . carbamazepine (TEGRETOL) chewable tablet 100 mg  100 mg Oral BID PC Kasim Mccorkle, MD      . LORazepam (ATIVAN) tablet 1 mg  1 mg Oral Q8H PRN Patrecia Pour, NP   1 mg at 07/25/16 2007  . magnesium hydroxide (MILK OF MAGNESIA) suspension 30 mL  30 mL Oral Daily PRN Patrecia Pour, NP      . OLANZapine zydis (ZYPREXA) disintegrating tablet 10 mg  10 mg Oral BID PRN Patrecia Pour, NP       Or  . OLANZapine (ZYPREXA) injection 10 mg  10 mg Intramuscular BID PRN Patrecia Pour, NP        Lab Results: No results found for this or any  previous visit (from the past 48 hour(s)).  Blood Alcohol level:  Lab Results  Component Value Date   [facility] <5 07/22/2016   ETH <5 53/61/4431    Metabolic Disorder Labs: Lab Results  Component Value Date   HGBA1C 5.4 06/20/2015   Lab Results  Component Value Date   PROLACTIN 31.2 (H) 06/20/2015   Lab Results  Component Value Date   CHOL 201 (H) 06/20/2015   TRIG 168 (H) 06/20/2015   HDL 26 (L) 06/20/2015   CHOLHDL 7.7 06/20/2015   VLDL 34 06/20/2015   LDLCALC 141 (H) 06/20/2015    Physical Findings: AIMS: Facial and Oral Movements Muscles of Facial Expression: None, normal Lips and Perioral Area: None, normal Jaw: None, normal Tongue: None, normal,Extremity Movements Upper (arms, wrists, hands, fingers): None, normal Lower (legs, knees, ankles, toes): None, normal, Trunk Movements Neck, shoulders, hips: None, normal, Overall Severity Severity of abnormal movements (highest score from questions above): None, normal Incapacitation due to abnormal movements: None, normal Patient's awareness of abnormal movements (rate only patient's report): No Awareness, Dental Status Current problems with teeth and/or dentures?: No Does patient usually wear dentures?: No  CIWA:  CIWA-Ar Total: 3 COWS:  COWS Total Score: 4  Musculoskeletal: Strength & Muscle Tone: within normal limits Gait & Station: normal Patient leans: N/A  Psychiatric Specialty Exam: Physical Exam  Nursing note and vitals reviewed.   Review of Systems  Gastrointestinal: Positive for abdominal pain (chronic - off and on).  Psychiatric/Behavioral: The patient is nervous/anxious.   All other systems reviewed and are negative.   Blood pressure (!) 166/99, pulse 98, temperature 99 F (37.2 C), temperature source Oral, resp. rate 18, height 5' 4"  (1.626 m), weight 99.8 kg (220 lb), SpO2 98 %.Body mass index is 37.76 kg/m.  General Appearance: Casual  Eye Contact:  Fair  Speech:  Clear and Coherent  Volume:   Normal  Mood:  Anxious  Affect:  Congruent  Thought Process:  Goal Directed and Descriptions of Associations: Circumstantial  Orientation:  Full (Time, Place, and Person)  Thought Content:  Rumination  Suicidal Thoughts:  No  Homicidal Thoughts:  No  Memory:  Immediate;   Fair Recent;   Fair Remote;   Fair  Judgement:  Fair  Insight:  Shallow  Psychomotor Activity:  Restlessness  Concentration:  Concentration: Poor and Attention Span: Poor  Recall:  [employer] of Knowledge:  Fair  Language:  Fair  Akathisia:  No  Handed:  Right  AIMS (if indicated):     Assets:  Desire for Improvement  ADL's:  Intact  Cognition:  WNL  Sleep:  Number of Hours: 5   Schizoaffective disorder, bipolar type ([place]) unstable  Will continue today 07/26/16 plan as below except where it is noted.    Treatment Plan Summary:Patient with concerns about her medications causing her to have ADRs, Will readjust her dose. Will get labs for her abdominal pain , states it is chronic , worsens when she is anxious, will offer symptomatic treatment. Daily contact with patient to assess and evaluate symptoms and progress in treatment, Medication management and Plan SEE BELOW   For schizoaffective do: Abilify 10 mg po daily.                                       Reduce Tegretol to 100 mg po bid. Tegretol level in 3 days.  For anxiety sx: PRN medications as per unit protocol.                          Will offer an SSRI .  For abdominal pain sx: will get labs - ? Pancreatic problems per family hx - will get lipase, alk. Phostpahtase, LFTs.                                       PRN maalox po prn.                                       Colace for constipation.                                       Will repeat UA.                                       Abdomnial USG - negative - 06/22/2015.  Will get TSH, lipid panel, hba1c, pl.  Will get EKG for qtc.monitoring.  CSW will work on disposition.  Letzy Gullickson,  MD 07/26/2016, 12:04 PM

## 2016-07-26 NOTE — Tx Team (Signed)
Interdisciplinary Treatment and Diagnostic Plan Update  07/26/2016 Time of Session: 8:31 AM  Margaret Christian MRN: [id number]  Principal Diagnosis: Schizoaffective disorder, bipolar type [facility])  Secondary Diagnoses: Principal Problem:   Schizoaffective disorder, bipolar type (HCC)   Current Medications:  Current Facility-Administered Medications  Medication Dose Route Frequency Provider Last Rate Last Dose  . acetaminophen (TYLENOL) tablet 650 mg  650 mg Oral Q4H PRN Charm Rings, NP   650 mg at 07/24/16 1539  . alum & mag hydroxide-simeth (MAALOX/MYLANTA) 200-200-20 MG/5ML suspension 30 mL  30 mL Oral Q4H PRN Charm Rings, NP      . ARIPiprazole (ABILIFY) tablet 10 mg  10 mg Oral Daily Charm Rings, NP   10 mg at 07/26/16 0802  . carbamazepine (TEGRETOL) tablet 200 mg  200 mg Oral BID PC Charm Rings, NP   200 mg at 07/26/16 0802  . LORazepam (ATIVAN) tablet 1 mg  1 mg Oral Q8H PRN Charm Rings, NP   1 mg at 07/25/16 2007  . magnesium hydroxide (MILK OF MAGNESIA) suspension 30 mL  30 mL Oral Daily PRN Charm Rings, NP      . OLANZapine zydis (ZYPREXA) disintegrating tablet 10 mg  10 mg Oral BID PRN Charm Rings, NP       Or  . OLANZapine (ZYPREXA) injection 10 mg  10 mg Intramuscular BID PRN Charm Rings, NP        PTA Medications: Prescriptions Prior to Admission  Medication Sig Dispense Refill Last Dose  . ARIPiprazole (ABILIFY) 20 MG tablet Take 1 tablet (20 mg total) by mouth daily. 30 tablet 0   . fish oil-omega-3 fatty acids 1000 MG capsule Take 2 g by mouth daily.   Taking  . LORazepam (ATIVAN) 1 MG tablet Take 1 tablet (1 mg total) by mouth every 8 (eight) hours as needed for anxiety (agitation). 10 tablet 0   . Multiple Vitamin (MULITIVITAMIN WITH MINERALS) TABS Take 1 tablet by mouth daily.   Taking  . zolpidem (AMBIEN) 5 MG tablet Take 1 tablet (5 mg total) by mouth at bedtime as needed for sleep. 30 tablet 0     Treatment Modalities: Medication  Management, Group therapy, Case management,  1 to 1 session with clinician, Psychoeducation, Recreational therapy.   Physician Treatment Plan for Primary Diagnosis: Schizoaffective disorder, bipolar type (HCC) Long Term Goal(s): Improvement in symptoms so as ready for discharge  Short Term Goals: Ability to verbalize feelings will improve Ability to disclose and discuss suicidal ideas Ability to demonstrate self-control will improve Ability to identify and develop effective coping behaviors will improve Ability to maintain clinical measurements within normal limits will improve Compliance with prescribed medications will improve Ability to identify triggers associated with substance abuse/mental health issues will improve Ability to identify changes in lifestyle to reduce recurrence of condition will improve Ability to verbalize feelings will improve Ability to disclose and discuss suicidal ideas Ability to demonstrate self-control will improve Ability to identify and develop effective coping behaviors will improve Ability to maintain clinical measurements within normal limits will improve Ability to identify triggers associated with substance abuse/mental health issues will improve  Medication Management: Evaluate patient's response, side effects, and tolerance of medication regimen.  Therapeutic Interventions: 1 to 1 sessions, Unit Group sessions and Medication administration.  Evaluation of Outcomes: Progressing  Physician Treatment Plan for Secondary Diagnosis: Principal Problem:   Schizoaffective disorder, bipolar type (HCC)   Long Term Goal(s): Improvement in symptoms so as  ready for discharge  Short Term Goals: Ability to verbalize feelings will improve Ability to disclose and discuss suicidal ideas Ability to demonstrate self-control will improve Ability to identify and develop effective coping behaviors will improve Ability to maintain clinical measurements within  normal limits will improve Compliance with prescribed medications will improve Ability to identify triggers associated with substance abuse/mental health issues will improve Ability to identify changes in lifestyle to reduce recurrence of condition will improve Ability to verbalize feelings will improve Ability to disclose and discuss suicidal ideas Ability to demonstrate self-control will improve Ability to identify and develop effective coping behaviors will improve Ability to maintain clinical measurements within normal limits will improve Ability to identify triggers associated with substance abuse/mental health issues will improve  Medication Management: Evaluate patient's response, side effects, and tolerance of medication regimen.  Therapeutic Interventions: 1 to 1 sessions, Unit Group sessions and Medication administration.  Evaluation of Outcomes: Progressing   RN Treatment Plan for Primary Diagnosis: Schizoaffective disorder, bipolar type ([place]) Long Term Goal(s): Knowledge of disease and therapeutic regimen to maintain health will improve  Short Term Goals: Ability to disclose and discuss suicidal ideas and Compliance with prescribed medications will improve  Medication Management: RN will administer medications as ordered by provider, will assess and evaluate patient's response and provide education to patient for prescribed medication. RN will report any adverse and/or side effects to prescribing provider.  Therapeutic Interventions: 1 on 1 counseling sessions, Psychoeducation, Medication administration, Evaluate responses to treatment, Monitor vital signs and CBGs as ordered, Perform/monitor CIWA, COWS, AIMS and Fall Risk screenings as ordered, Perform wound care treatments as ordered.  Evaluation of Outcomes: Progressing   Recreational Therapy Treatment Plan for Primary Diagnosis: Schizoaffective disorder, bipolar type ([place]) Long Term Goal(s): LTG- Patient will participate  in recreation therapy tx in at least 2 group sessions without prompting from LRT.  Short Term Goals: Patient will be able to identify at least 5 coping skills for admitting dx by conclusion of recreation therapy tx.  Treatment Modalities: Group and Pet Therapy  Therapeutic Interventions: Psychoeducation  Evaluation of Outcomes: Progressing   LCSW Treatment Plan for Primary Diagnosis: Schizoaffective disorder, bipolar type ([place]) Long Term Goal(s): Safe transition to appropriate next level of care at discharge, Engage patient in therapeutic group addressing interpersonal concerns.  Short Term Goals: Engage patient in aftercare planning with referrals and resources  Therapeutic Interventions: Assess for all discharge needs, 1 to 1 time with Social worker, Explore available resources and support systems, Assess for adequacy in community support network, Educate family and significant other(s) on suicide prevention, Complete Psychosocial Assessment, Interpersonal group therapy.  Evaluation of Outcomes: Met  Return home, follow up outpt   Progress in Treatment: Attending groups: Yes Participating in groups: Yes Taking medication as prescribed: Yes Toleration medication: Yes, no side effects reported at this time Family/Significant other contact made:  Patient understands diagnosis: No Minimizing  "My brother threatened to not help me financially if I did not come to get on meds." Discussing patient identified problems/goals with staff: Yes Medical problems stabilized or resolved: Yes Denies suicidal/homicidal ideation: Yes Issues/concerns per patient self-inventory: None Other: N/A  New problem(s) identified: None identified at this time.   New Short Term/Long Term Goal(s): None identified at this time.   Discharge Plan or Barriers:   Reason for Continuation of Hospitalization: Disorganization Paranoia Medical Issues Medication stabilization   Estimated Length of Stay: 3-5  days  Attendees: Patient: 07/26/2016  8:31 AM  Physician: Ursula Alert, MD  07/26/2016  8:31 AM  Nursing: Hoy Register, RN 07/26/2016  8:31 AM  RN Care Manager: Lars Pinks, RN 07/26/2016  8:31 AM  Social Worker: Ripley Fraise 07/26/2016  8:31 AM  Recreational Therapist: Laretta Bolster  07/26/2016  8:31 AM  Other: Norberto Sorenson 07/26/2016  8:31 AM  Other:  07/26/2016  8:31 AM    Scribe for Treatment Team:  Roque Lias LCSW 07/26/2016 8:31 AM

## 2016-07-26 NOTE — Progress Notes (Signed)
Recreation Therapy Notes  Date: 07/26/16 Time: 1000 Location: 500 Hall Dayroom  Group Topic: Anger Management  Goal Area(s) Addresses:  Patient will identify triggers for anger.  Patient will identify physical reaction to anger.   Patient will identify benefit of using coping skills when angry.  Behavioral Response: Engaged  Intervention: Anger Contractorumbrella worksheet, pencils  Activity:  Anger Umbrella.  Patients were given a worksheet with an umbrella on it.  Patients were to fill in the umbrella with the emotions that cause anger or situations that cause them to get angry.  Once patients identified the emotions and situations that cause anger, they were to identify coping skills and write them along the outside of the umbrella.    Education: Anger Management, Discharge Planning   Education Outcome: Acknowledges education/In group clarification offered/Needs additional education.   Clinical Observations/Feedback: Pt identified being ignored as something that gets her angry.  Pt identified "finding another route of support" as her coping skill.   Caroll RancherMarjette Khy Pitre, LRT/CTRS       Caroll RancherLindsay, Aston Lieske A 07/26/2016 12:36 PM

## 2016-07-26 NOTE — [facility] Group Notes (Signed)
[facility] LCSW Group Therapy  07/26/2016 1:15 pm  Type of Therapy: Process Group Therapy  Participation Level:  Active  Participation Quality:  Appropriate  Affect:  Flat  Cognitive:  Oriented  Insight:  Improving  Engagement in Group:  Limited  Engagement in Therapy:  Limited  Modes of Intervention:  Activity, Clarification, Education, Problem-solving and Support  Summary of Progress/Problems: Today's group addressed the issue of overcoming obstacles.  Patients were asked to identify their biggest obstacle post d/c that stands in the way of their on-going success, and then problem solve as to how to manage this. Stayed the entire time, engaged throughout.  "I'm lacking family support, and that makes it hard as a single mother.  And being away in the hospital makes it even harder."  Was not able to cite any support family gives, but did cite multiple negative influences.  However, cited her boss a someone who is genuinely concerned about all employees, and appreciates the fact that is someone in her life who she feels is in her corner.  States she is "considering" seeing a therapist.  Ida Rogueorth, Jocabed Cheese B 07/26/2016   2:37 PM

## 2016-07-27 LAB — LIPID PANEL
Cholesterol: 163 mg/dL (ref 0–200)
HDL: 36 mg/dL — AB (ref >40)
LDL Cholesterol: 94 mg/dL (ref 0–99)
Total CHOL/HDL Ratio: 4.5 RATIO
Triglycerides: 166 mg/dL — ABNORMAL HIGH (ref <150)
VLDL: 33 mg/dL (ref 0–40)

## 2016-07-27 LAB — URINALYSIS, COMPLETE (UACMP) WITH MICROSCOPIC
BILIRUBIN URINE: NEGATIVE
Glucose, UA: NEGATIVE mg/dL
KETONES UR: NEGATIVE mg/dL
Leukocytes, UA: NEGATIVE
Nitrite: NEGATIVE
PROTEIN: NEGATIVE mg/dL
Specific Gravity, Urine: 1.005 (ref 1.005–1.030)
pH: 7 (ref 5.0–8.0)

## 2016-07-27 LAB — HEPATIC FUNCTION PANEL
ALBUMIN: 3.9 g/dL (ref 3.5–5.0)
ALK PHOS: 52 U/L (ref 38–126)
ALT: 20 U/L (ref 14–54)
AST: 14 U/L — AB (ref 15–41)
Bilirubin, Direct: <0.1 mg/dL — ABNORMAL LOW (ref 0.1–0.5)
TOTAL PROTEIN: 6.8 g/dL (ref 6.5–8.1)
Total Bilirubin: 0.2 mg/dL — ABNORMAL LOW (ref 0.3–1.2)

## 2016-07-27 LAB — TSH: TSH: 1.087 u[IU]/mL (ref 0.350–4.500)

## 2016-07-27 MED ORDER — HYDROXYZINE HCL 25 MG PO TABS
25.0000 mg | ORAL_TABLET | Freq: Once | ORAL | Status: DC
  Filled 2016-07-27: qty 1

## 2016-07-27 NOTE — [facility] Group Notes (Signed)
[facility] LCSW Group Therapy  07/27/2016 , 1:30 PM   Type of Therapy:  Group Therapy  Participation Level:  Active  Participation Quality:  Attentive  Affect:  Appropriate  Cognitive:  Alert  Insight:  Improving  Engagement in Therapy:  Engaged  Modes of Intervention:  Discussion, Exploration and Socialization  Summary of Progress/Problems: Today's group focused on the term Diagnosis.  Participants were asked to define the term, and then pronounce whether it is a negative, positive or neutral term.  Stayed the entire time, engaged throughout.  Talked about her experience with community here, and how it's nice to not face stigma.  Described limited support from family, and how she has pieced a support system together among work, old church friends and neighbors.  Enjoys giving feedback, advice and encouragement to others.  Margaret Christian, Margaret Christian 07/27/2016 , 1:30 PM

## 2016-07-27 NOTE — Progress Notes (Signed)
[facility] MD Progress Note  07/27/2016 2:04 PM Margaret Christian  MRN:  [id number] Subjective: Pt states " I am ok , my sleep was disrupted last night, but I still slept. I still have this vague abdominal pain, but I will follow up with PCP.'   Objective: Patient seen and chart reviewed.Discussed patient with treatment team.  Pt continues to be somatic , anxious , however she is improving. Pt is tolerating her medications well, denies ADRs. Pt is tolerating the lower dose of tegretol better, will get tegretol level tomorrow. Continue to encourage and support.    Principal Problem: Schizoaffective disorder, bipolar type (HCC) Diagnosis:   Patient Active Problem List   Diagnosis Date Noted  . Schizoaffective disorder, bipolar type (HCC) [F25.0] 07/23/2016  . Paranoid schizophrenia (HCC) [F20.0]   . Schizophrenia (HCC) [F20.9] 06/20/2015  . URI (upper respiratory infection) [J06.9] 01/30/2013  . Prediabetes [R73.03] 01/30/2013  . Preventative health care [Z00.00] 11/23/2012  . Numbness and tingling in hands [R20.0, R20.2] 11/23/2012  . Elevated blood pressure reading without diagnosis of hypertension [R03.0] 10/31/2011  . Morbid obesity (HCC) [E66.01] 10/31/2011   Total Time spent with patient: 20 minutes  Past Psychiatric History: Please see H&P.   Past Medical History:  Past Medical History:  Diagnosis Date  . Hypertension   . Paranoid schizophrenia (HCC)   . Phlebitis     Past Surgical History:  Procedure Laterality Date  . CESAREAN SECTION  2001   Family History:  Family History  Problem Relation Age of Onset  . Heart failure Father   . Hypertension Father   . Hypertension Brother   . Diabetes type II Paternal Grandfather   . Aortic aneurysm Maternal Grandfather   . Coronary artery disease Maternal Grandfather   . Stroke Maternal Grandfather    Family Psychiatric  History: Please see H&P.  Social History: Please see H&P.  History  Alcohol Use  . 0.0 oz/week      History  Drug Use No    Social History   Social History  . Marital status: Single    Spouse name: N/A  . Number of children: N/A  . Years of education: N/A   Social History Main Topics  . Smoking status: Light Tobacco Smoker    Types: Cigarettes  . Smokeless tobacco: Never Used  . Alcohol use 0.0 oz/week  . Drug use: No  . Sexual activity: Not Currently    Birth control/ protection: None   Other Topics Concern  . None   Social History Narrative   ** Merged History Encounter **       Single mom   Son - 53 y/o    Works at [employer]   Additional Social History:    Pain Medications: see PTA list Prescriptions: See PTA list Over the Counter: see PTA list History of alcohol / drug use?: No history of alcohol / drug abuse                    Sleep: disrupted by peer last night, but is improving  Appetite:  Fair  Current Medications: Current Facility-Administered Medications  Medication Dose Route Frequency Provider Last Rate Last Dose  . acetaminophen (TYLENOL) tablet 650 mg  650 mg Oral Q4H PRN Charm Rings, NP   650 mg at 07/24/16 1539  . alum & mag hydroxide-simeth (MAALOX/MYLANTA) 200-200-20 MG/5ML suspension 30 mL  30 mL Oral Q4H PRN Charm Rings, NP      . amLODipine (NORVASC)  tablet 5 mg  5 mg Oral Daily Kerry Hough, PA-C   5 mg at 07/27/16 1011  . ARIPiprazole (ABILIFY) tablet 10 mg  10 mg Oral Daily Charm Rings, NP   10 mg at 07/27/16 1010  . carbamazepine (TEGRETOL) chewable tablet 100 mg  100 mg Oral BID PC Nashton Belson, MD   100 mg at 07/27/16 1010  . docusate sodium (COLACE) capsule 100 mg  100 mg Oral BID PRN Jomarie Longs, MD      . hydrALAZINE (APRESOLINE) tablet 10 mg  10 mg Oral Q6H PRN Kerry Hough, PA-C      . hydrOXYzine (ATARAX/VISTARIL) tablet 25 mg  25 mg Oral Once [employer], PA-C      . LORazepam (ATIVAN) tablet 1 mg  1 mg Oral Q8H PRN Charm Rings, NP   1 mg at 07/27/16 0224  . magnesium hydroxide (MILK OF  MAGNESIA) suspension 30 mL  30 mL Oral Daily PRN Charm Rings, NP      . OLANZapine zydis (ZYPREXA) disintegrating tablet 10 mg  10 mg Oral BID PRN Charm Rings, NP       Or  . OLANZapine (ZYPREXA) injection 10 mg  10 mg Intramuscular BID PRN Charm Rings, NP      . pantoprazole (PROTONIX) EC tablet 40 mg  40 mg Oral BID AC Jomarie Longs, MD   40 mg at 07/26/16 1636    Lab Results:  Results for orders placed or performed during the hospital encounter of 07/23/16 (from the past 48 hour(s))  TSH     Status: None   Collection Time: 07/27/16  6:06 AM  Result Value Ref Range   TSH 1.087 0.350 - 4.500 uIU/mL    Comment: Performed by a 3rd Generation assay with a functional sensitivity of <=0.01 uIU/mL. Performed at [facility], [address], [place], [place] [address]   Lipid panel     Status: Abnormal   Collection Time: 07/27/16  6:06 AM  Result Value Ref Range   Cholesterol 163 0 - 200 mg/dL   Triglycerides 604 (H) <150 mg/dL   HDL 36 (L) >54 mg/dL   Total CHOL/HDL Ratio 4.5 RATIO   VLDL 33 0 - 40 mg/dL   LDL Cholesterol 94 0 - 99 mg/dL    Comment:        Total Cholesterol/HDL:CHD Risk Coronary Heart Disease Risk Table                     Men   Women  1/2 Average Risk   3.4   3.3  Average Risk       5.0   4.4  2 X Average Risk   9.6   7.1  3 X Average Risk  23.4   11.0        Use the calculated Patient Ratio above and the CHD Risk Table to determine the patient's CHD Risk.        ATP III CLASSIFICATION (LDL):  <100     mg/dL   Optimal  098-119  mg/dL   Near or Above                    Optimal  130-159  mg/dL   Borderline  147-829  mg/dL   High  >562     mg/dL   Very High Performed at [facility], [address] [place], [place] [address]  Hepatic function panel     Status: Abnormal   Collection Time: 07/27/16  6:06 AM  Result Value Ref Range   Total Protein 6.8 6.5 - 8.1 g/dL   Albumin 3.9 3.5 - 5.0 g/dL   AST 14 (L) 15 - 41 U/L    ALT 20 14 - 54 U/L   Alkaline Phosphatase 52 38 - 126 U/L   Total Bilirubin 0.2 (L) 0.3 - 1.2 mg/dL   Bilirubin, Direct <1.6 (L) 0.1 - 0.5 mg/dL   Indirect Bilirubin NOT CALCULATED 0.3 - 0.9 mg/dL    Comment: Performed at [facility], [address] [place], [place] [address]    Blood Alcohol level:  Lab Results  Component Value Date   [facility] <5 07/22/2016   ETH <5 06/18/2015    Metabolic Disorder Labs: Lab Results  Component Value Date   HGBA1C 5.4 06/20/2015   Lab Results  Component Value Date   PROLACTIN 31.2 (H) 06/20/2015   Lab Results  Component Value Date   CHOL 163 07/27/2016   TRIG 166 (H) 07/27/2016   HDL 36 (L) 07/27/2016   CHOLHDL 4.5 07/27/2016   VLDL 33 07/27/2016   LDLCALC 94 07/27/2016   LDLCALC 141 (H) 06/20/2015    Physical Findings: AIMS: Facial and Oral Movements Muscles of Facial Expression: None, normal Lips and Perioral Area: None, normal Jaw: None, normal Tongue: None, normal,Extremity Movements Upper (arms, wrists, hands, fingers): None, normal Lower (legs, knees, ankles, toes): None, normal, Trunk Movements Neck, shoulders, hips: None, normal, Overall Severity Severity of abnormal movements (highest score from questions above): None, normal Incapacitation due to abnormal movements: None, normal Patient's awareness of abnormal movements (rate only patient's report): No Awareness, Dental Status Current problems with teeth and/or dentures?: No Does patient usually wear dentures?: No  CIWA:  CIWA-Ar Total: 3 COWS:  COWS Total Score: 4  Musculoskeletal: Strength & Muscle Tone: within normal limits Gait & Station: normal Patient leans: N/A  Psychiatric Specialty Exam: Physical Exam  Nursing note and vitals reviewed.   Review of Systems  Gastrointestinal: Positive for abdominal pain (chronic - off and on).  Psychiatric/Behavioral: The patient is nervous/anxious.   All other systems reviewed and are negative.    Blood pressure (!) 143/94, pulse 93, temperature 98.5 F (36.9 C), resp. rate 20, height 5\' 4"  (1.626 m), weight 99.8 kg (220 lb), SpO2 98 %.Body mass index is 37.76 kg/m.  General Appearance: Casual  Eye Contact:  Fair  Speech:  Clear and Coherent  Volume:  Normal  Mood:  Anxious  Affect:  Congruent  Thought Process:  Goal Directed and Descriptions of Associations: Circumstantial  Orientation:  Full (Time, Place, and Person)  Thought Content:  Rumination  Suicidal Thoughts:  No  Homicidal Thoughts:  No  Memory:  Immediate;   Fair Recent;   Fair Remote;   Fair  Judgement:  Fair  Insight:  Shallow  Psychomotor Activity:  Restlessness  Concentration:  Concentration: Fair and Attention Span: Fair  Recall:  [employer] of Knowledge:  Fair  Language:  Fair  Akathisia:  No  Handed:  Right  AIMS (if indicated):     Assets:  Desire for Improvement  ADL's:  Intact  Cognition:  WNL  Sleep:  Number of Hours: 5   Schizoaffective disorder, bipolar type (HCC) improving  Will continue today 07/27/16 plan as below except where it is noted.     Treatment Plan Summary:Patient is improving, tolerating her tegretol better, will get level .  Continues to have somatic sx- but her pain is vague unspecified , she is observed as visible in milieu and her pain does not seem to bother her functioning. Will refer her to her outpatient provider.  Daily contact with patient to assess and evaluate symptoms and progress in treatment, Medication management and Plan See below   For schizoaffective do: Abilify 10 mg po daily.reviewed                                        Reduce Tegretol to 100 mg po bid. Tegretol level tomorrow AM.  For anxiety sx: PRN medications as per unit protocol.                          Will offer an SSRI .  For abdominal pain sx: labs - lft, lipase- wnl                                        PRN maalox po prn.                                       Colace for  constipation.                                       Will repeat UA.                                       Abdomnial USG - negative - 06/22/2015.  Labs reviewed - tsh - wnl, lipid panel - HDL - 36 - will recommend diet management , lfts- wnl.   EKG reviewed  NSR, qtc - wnl.  CSW will work on disposition.  Marylou Wages, MD 07/27/2016, 2:04 PM

## 2016-07-27 NOTE — Progress Notes (Signed)
Recreation Therapy Notes  Date: 07/27/16 Time: 1000 Location: 500 [address] Dayroom  Group Topic: Leisure Education  Goal Area(s) Addresses:  Patient will identify positive leisure activities.  Patient will identify one positive benefit of participation in leisure activities.  Patient will identify how using leisure time effectively will benefit them post d/c.  Behavioral Response: Engaged  Intervention: Scientist, clinical (histocompatibility and immunogenetics)Construction paper, scissors, markers, glue sticks, magazines  Activity: Leisure PSA.  Patients were to create a public service announcement to advertise leisure.  Patients were to include a definition of leisure, who benefits from leisure and give examples of activities they can do outdoors, indoors and by themselves.  Education:  Leisure Education, Building control surveyorDischarge Planning  Education Outcome: Acknowledges education/In group clarification offered/Needs additional education  Clinical Observations/Feedback: Pt identified her leisure activities as "anything around water".  Pt identified her benefits of leisure as healing and introspection.  Pt also stated she doesn't have leisure time and her only leisure is a vacation she takes every Christmas with her son.  Pt explained that this vacation cheers up her son.   Margaret RancherMarjette Herberta Christian, LRT/CTRS     Lillia AbedLindsay, Ellene Bloodsaw A 07/27/2016 11:45 AM

## 2016-07-27 NOTE — Plan of Care (Signed)
Problem: Safety: Goal: Periods of time without injury will increase Outcome: Progressing Pt safe on the unit at this time   

## 2016-07-27 NOTE — Progress Notes (Signed)
DAR NOTE: Patient presents with anxious affect and mood.  Denies pain, auditory and visual hallucinations.  Described energy level as normal and concentration as good.  Rates depression at 0, hopelessness at 0, and anxiety at 0.  Maintained on routine safety checks.  Medications given as prescribed.  Support and encouragement offered as needed.  Attended group and participated.  States goal for today is "to get discharge before the first."  Patient observed socializing with peers in the dayroom. Offered no complaint.

## 2016-07-27 NOTE — Progress Notes (Signed)
Adult Psychoeducational Group Note  Date:  07/27/2016 Time:  9:13 PM  Group Topic/Focus:  Wrap-Up Group:   The focus of this group is to help patients review their daily goal of treatment and discuss progress on daily workbooks.  Participation Level:  Active  Participation Quality:  Appropriate  Affect:  Appropriate  Cognitive:  Appropriate  Insight: Appropriate  Engagement in Group:  Engaged  Modes of Intervention:  Discussion  Additional Comments:   Patient attended group and said that her day was a 7.  She was happy that her family was able to visit with her today.   Sorina Derrig W Lenzy Kerschner 07/27/2016, 9:13 PM

## 2016-07-27 NOTE — Progress Notes (Signed)
Pt reports that she has had a better day.  She feels her meds are helping her.  She denies SI/HI/AVH at this time.  She did become a little upset when her belongings that were brought by her mother were not what she wanted.  She sounded angry as she talked with her mother on the phone, but she quickly calmed down after getting off the phone, and thanked staff for going to get her clothes for her.  Pt makes her needs known to staff.  She was able to visit with her son earlier in the evening, and that made her happy.  Writer noticed that pt has been hypertensive since being on the unit and spoke to the PA who added an anti-hypertensive medication to patient's orders.  Pt was informed.  Support and encouragement offered.  Discharge plans are in process.  Safety maintained with q15 minute checks.

## 2016-07-27 NOTE — Progress Notes (Signed)
D: Pt denies SI/HI/AVH. Pt is pleasant and cooperative. Pt   Stated she was doing better due to less anxiety.   A: Pt was offered support and encouragement. Pt was given scheduled medications. Pt was encourage to attend groups. Q 15 minute checks were done for safety.   R:Pt attends groups and interacts well with peers and staff. Pt is taking medication. Pt has no complaints at this time .Pt receptive to treatment and safety maintained on unit.

## 2016-07-28 LAB — PROLACTIN: Prolactin: 56.5 ng/mL — ABNORMAL HIGH (ref 4.8–23.3)

## 2016-07-28 LAB — CARBAMAZEPINE LEVEL, TOTAL: Carbamazepine Lvl: 5.3 ug/mL (ref 4.0–12.0)

## 2016-07-28 LAB — HEMOGLOBIN A1C
HEMOGLOBIN A1C: 5.3 % (ref 4.8–5.6)
MEAN PLASMA GLUCOSE: 105 mg/dL

## 2016-07-28 MED ORDER — ARIPIPRAZOLE 10 MG PO TABS: 10.0000 mg | ORAL_TABLET | Freq: Every day | ORAL | 0 refills | Status: DC

## 2016-07-28 MED ORDER — PANTOPRAZOLE SODIUM 40 MG PO TBEC: 40.0000 mg | DELAYED_RELEASE_TABLET | Freq: Two times a day (BID) | ORAL | 0 refills | Status: AC

## 2016-07-28 MED ORDER — AMLODIPINE BESYLATE 5 MG PO TABS: 5.0000 mg | ORAL_TABLET | Freq: Every day | ORAL | 0 refills | Status: DC

## 2016-07-28 MED ORDER — CARBAMAZEPINE 100 MG PO CHEW: 100.0000 mg | CHEWABLE_TABLET | Freq: Two times a day (BID) | ORAL | 0 refills | Status: DC

## 2016-07-28 NOTE — Tx Team (Signed)
Interdisciplinary Treatment and Diagnostic Plan Update  07/28/2016 Time of Session: 9:53 AM  Margaret Christian MRN: [id number]  Principal Diagnosis: Schizoaffective disorder, bipolar type [facility])  Secondary Diagnoses: Principal Problem:   Schizoaffective disorder, bipolar type ([place])   Current Medications:  Current Facility-Administered Medications  Medication Dose Route Frequency Provider Last Rate Last Dose  . acetaminophen (TYLENOL) tablet 650 mg  650 mg Oral Q4H PRN Patrecia Pour, NP   650 mg at 07/24/16 1539  . alum & mag hydroxide-simeth (MAALOX/MYLANTA) 200-200-20 MG/5ML suspension 30 mL  30 mL Oral Q4H PRN Patrecia Pour, NP      . amLODipine (NORVASC) tablet 5 mg  5 mg Oral Daily Laverle Hobby, PA-C   5 mg at 07/28/16 0801  . ARIPiprazole (ABILIFY) tablet 10 mg  10 mg Oral Daily Patrecia Pour, NP   10 mg at 07/28/16 0800  . carbamazepine (TEGRETOL) chewable tablet 100 mg  100 mg Oral BID PC Saramma Eappen, MD   100 mg at 07/28/16 0801  . docusate sodium (COLACE) capsule 100 mg  100 mg Oral BID PRN Ursula Alert, MD      . hydrALAZINE (APRESOLINE) tablet 10 mg  10 mg Oral Q6H PRN Laverle Hobby, PA-C      . hydrOXYzine (ATARAX/VISTARIL) tablet 25 mg  25 mg Oral Once [employer], PA-C      . LORazepam (ATIVAN) tablet 1 mg  1 mg Oral Q8H PRN Patrecia Pour, NP   1 mg at 07/27/16 2032  . magnesium hydroxide (MILK OF MAGNESIA) suspension 30 mL  30 mL Oral Daily PRN Patrecia Pour, NP      . OLANZapine zydis (ZYPREXA) disintegrating tablet 10 mg  10 mg Oral BID PRN Patrecia Pour, NP       Or  . OLANZapine (ZYPREXA) injection 10 mg  10 mg Intramuscular BID PRN Patrecia Pour, NP      . pantoprazole (PROTONIX) EC tablet 40 mg  40 mg Oral BID AC Ursula Alert, MD   40 mg at 07/28/16 9381    PTA Medications: Prescriptions Prior to Admission  Medication Sig Dispense Refill Last Dose  . ARIPiprazole (ABILIFY) 20 MG tablet Take 1 tablet (20 mg total) by mouth daily. 30 tablet 0    . fish oil-omega-3 fatty acids 1000 MG capsule Take 2 g by mouth daily.   Taking  . LORazepam (ATIVAN) 1 MG tablet Take 1 tablet (1 mg total) by mouth every 8 (eight) hours as needed for anxiety (agitation). 10 tablet 0   . Multiple Vitamin (MULITIVITAMIN WITH MINERALS) TABS Take 1 tablet by mouth daily.   Taking  . zolpidem (AMBIEN) 5 MG tablet Take 1 tablet (5 mg total) by mouth at bedtime as needed for sleep. 30 tablet 0     Treatment Modalities: Medication Management, Group therapy, Case management,  1 to 1 session with clinician, Psychoeducation, Recreational therapy.   Physician Treatment Plan for Primary Diagnosis: Schizoaffective disorder, bipolar type ([place]) Long Term Goal(s): Improvement in symptoms so as ready for discharge  Short Term Goals: Ability to verbalize feelings will improve Ability to disclose and discuss suicidal ideas Ability to demonstrate self-control will improve Ability to identify and develop effective coping behaviors will improve Ability to maintain clinical measurements within normal limits will improve Compliance with prescribed medications will improve Ability to identify triggers associated with substance abuse/mental health issues will improve Ability to identify changes in lifestyle to reduce recurrence of condition will  improve Ability to verbalize feelings will improve Ability to disclose and discuss suicidal ideas Ability to demonstrate self-control will improve Ability to identify and develop effective coping behaviors will improve Ability to maintain clinical measurements within normal limits will improve Ability to identify triggers associated with substance abuse/mental health issues will improve  Medication Management: Evaluate patient's response, side effects, and tolerance of medication regimen.  Therapeutic Interventions: 1 to 1 sessions, Unit Group sessions and Medication administration.  Evaluation of Outcomes: Adequate for  Discharge  Physician Treatment Plan for Secondary Diagnosis: Principal Problem:   Schizoaffective disorder, bipolar type ([place])   Long Term Goal(s): Improvement in symptoms so as ready for discharge  Short Term Goals: Ability to verbalize feelings will improve Ability to disclose and discuss suicidal ideas Ability to demonstrate self-control will improve Ability to identify and develop effective coping behaviors will improve Ability to maintain clinical measurements within normal limits will improve Compliance with prescribed medications will improve Ability to identify triggers associated with substance abuse/mental health issues will improve Ability to identify changes in lifestyle to reduce recurrence of condition will improve Ability to verbalize feelings will improve Ability to disclose and discuss suicidal ideas Ability to demonstrate self-control will improve Ability to identify and develop effective coping behaviors will improve Ability to maintain clinical measurements within normal limits will improve Ability to identify triggers associated with substance abuse/mental health issues will improve  Medication Management: Evaluate patient's response, side effects, and tolerance of medication regimen.  Therapeutic Interventions: 1 to 1 sessions, Unit Group sessions and Medication administration.  Evaluation of Outcomes: Adequate for Discharge   RN Treatment Plan for Primary Diagnosis: Schizoaffective disorder, bipolar type ([place]) Long Term Goal(s): Knowledge of disease and therapeutic regimen to maintain health will improve  Short Term Goals: Ability to disclose and discuss suicidal ideas and Compliance with prescribed medications will improve  Medication Management: RN will administer medications as ordered by provider, will assess and evaluate patient's response and provide education to patient for prescribed medication. RN will report any adverse and/or side effects to  prescribing provider.  Therapeutic Interventions: 1 on 1 counseling sessions, Psychoeducation, Medication administration, Evaluate responses to treatment, Monitor vital signs and CBGs as ordered, Perform/monitor CIWA, COWS, AIMS and Fall Risk screenings as ordered, Perform wound care treatments as ordered.  Evaluation of Outcomes: Adequate for Discharge   Recreational Therapy Treatment Plan for Primary Diagnosis: Schizoaffective disorder, bipolar type ([place]) Long Term Goal(s): LTG- Patient will participate in recreation therapy tx in at least 2 group sessions without prompting from LRT.  Short Term Goals: Patient will be able to identify at least 5 coping skills for admitting dx by conclusion of recreation therapy tx.  Treatment Modalities: Group and Pet Therapy  Therapeutic Interventions: Psychoeducation  Evaluation of Outcomes: Adequate for Discharge   LCSW Treatment Plan for Primary Diagnosis: Schizoaffective disorder, bipolar type ([place]) Long Term Goal(s): Safe transition to appropriate next level of care at discharge, Engage patient in therapeutic group addressing interpersonal concerns.  Short Term Goals: Engage patient in aftercare planning with referrals and resources  Therapeutic Interventions: Assess for all discharge needs, 1 to 1 time with Social worker, Explore available resources and support systems, Assess for adequacy in community support network, Educate family and significant other(s) on suicide prevention, Complete Psychosocial Assessment, Interpersonal group therapy.  Evaluation of Outcomes: Met  Return home, follow up [facility] in Treatment: Attending groups: Yes Participating in groups: Yes Taking medication as prescribed: Yes Toleration medication: Yes, no  side effects reported at this time Family/Significant other contact made:  Patient understands diagnosis: No Minimizing  "My brother threatened to not help me financially if I did not come to  get on meds." Discussing patient identified problems/goals with staff: Yes Medical problems stabilized or resolved: Yes Denies suicidal/homicidal ideation: Yes Issues/concerns per patient self-inventory: None Other: N/A  New problem(s) identified: None identified at this time.   New Short Term/Long Term Goal(s): None identified at this time.   Discharge Plan or Barriers:   Reason for Continuation of Hospitalization:    Estimated Length of Stay: D/C today  Attendees: Patient: 07/28/2016  9:53 AM  Physician: Ursula Alert, MD 07/28/2016  9:53 AM  Nursing: Hoy Register, RN 07/28/2016  9:53 AM  RN Care Manager: Lars Pinks, RN 07/28/2016  9:53 AM  Social Worker: Ripley Fraise 07/28/2016  9:53 AM  Recreational Therapist: Laretta Bolster  07/28/2016  9:53 AM  Other: Norberto Sorenson 07/28/2016  9:53 AM  Other:  07/28/2016  9:53 AM    Scribe for Treatment Team:  Roque Lias LCSW 07/28/2016 9:53 AM

## 2016-07-28 NOTE — Plan of Care (Signed)
[facility] The[facility] Inter[facility] Goal: STG-Patient will identify at least five coping skills for ** STG: Coping Skills - Patient will be able to identify at least 5 coping skills for disorientation by conclusion of recreation therapy tx  Outcome: Adequate for Discharge Pt was able to identify coping skills at completion of anger management and leisure education recreation therapy sessions.  Caroll RancherMarjette Renard Caperton, LRT/CTRS

## 2016-07-28 NOTE — Discharge Summary (Signed)
Physician Discharge Summary Note  Patient:  Margaret Christian Seeney is an 51 y.o., female MRN:  [id number] DOB:  July 09, 1965 Patient phone:  [phone] (home)  Patient address:   [address] [place] [id number],  Total Time spent with patient: 30 minutes  Date of Admission:  07/23/2016 Date of Discharge: 07/28/2016  Reason for Admission:  disorganized thoughts, aggressive behavior  Principal Problem: Schizoaffective disorder, bipolar type [facility](HCC) Discharge Diagnoses: Patient Active Problem List   Diagnosis Date Noted  . Schizoaffective disorder, bipolar type (HCC) [F25.0] 07/23/2016  . Paranoid schizophrenia (HCC) [F20.0]   . Schizophrenia (HCC) [F20.9] 06/20/2015  . URI (upper respiratory infection) [J06.9] 01/30/2013  . Prediabetes [R73.03] 01/30/2013  . Preventative health care [Z00.00] 11/23/2012  . Numbness and tingling in hands [R20.0, R20.2] 11/23/2012  . Elevated blood pressure reading without diagnosis of hypertension [R03.0] 10/31/2011  . Morbid obesity (HCC) [E66.01] 10/31/2011    Past Psychiatric History: see HPI  Past Medical History:  Past Medical History:  Diagnosis Date  . Hypertension   . Paranoid schizophrenia (HCC)   . Phlebitis     Past Surgical History:  Procedure Laterality Date  . CESAREAN SECTION  2001   Family History:  Family History  Problem Relation Age of Onset  . Heart failure Father   . Hypertension Father   . Hypertension Brother   . Diabetes type II Paternal Grandfather   . Aortic aneurysm Maternal Grandfather   . Coronary artery disease Maternal Grandfather   . Stroke Maternal Grandfather    Family Psychiatric  History: see HPI Social History:  History  Alcohol Use  . 0.0 oz/week     History  Drug Use No    Social History   Social History  . Marital status: Single    Spouse name: N/A  . Number of children: N/A  . Years of education: N/A   Social History Main Topics  . Smoking status: Light Tobacco Smoker   Types: Cigarettes  . Smokeless tobacco: Never Used  . Alcohol use 0.0 oz/week  . Drug use: No  . Sexual activity: Not Currently    Birth control/ protection: None   Other Topics Concern  . None   Social History Narrative   ** Merged History Encounter **       Single mom   Son - 49 y/o    Works at [employer]    Hospital Course:  Beckie BusingMartin Swider, 51 yo female, [facility]  Patient presented with bizarre affect.  Disorganized thoughts.  It was reported that patient has missed medication, wandering away from house, sleeplessness for 3 days, threatening family members and aggression.    Margaret Christian Tieszen was admitted for Schizoaffective disorder, bipolar type [facility](HCC) and crisis management.  Patient was treated with medications with their indications listed below in detail under Medication List.  Medical problems were identified and treated as needed.  Home medications were restarted as appropriate.  Improvement was monitored by observation and Margaret Christian Bark daily report of symptom reduction.  Emotional and mental status was monitored by daily self inventory reports completed by Margaret Christian Holecek and clinical staff.  Patient reported continued improvement, denied any new concerns.  Patient had been compliant on medications and denied side effects.  Support and encouragement was provided.         Margaret Christian Thau was evaluated by the treatment team for stability and plans for continued recovery upon discharge.  Patient was offered further treatment options upon discharge including Residential, Intensive  Outpatient and Outpatient treatment. Patient will follow up with agency listed below for medication management and counseling.  Encouraged patient to maintain satisfactory support network and home environment.  Advised to adhere to medication compliance and outpatient treatment follow up.  Prescriptions provided.       Margaret Bachelor motivation was an integral factor for scheduling  further treatment.  Employment, transportation, bed availability, health status, family support, and any pending legal issues were also considered during patient's hospital stay.  Upon completion of this admission the patient was both mentally and medically stable for discharge denying suicidal/homicidal ideation, auditory/visual/tactile hallucinations, delusional thoughts and paranoia.      Physical Findings: AIMS: Facial and Oral Movements Muscles of Facial Expression: None, normal Lips and Perioral Area: None, normal Jaw: None, normal Tongue: None, normal,Extremity Movements Upper (arms, wrists, hands, fingers): None, normal Lower (legs, knees, ankles, toes): None, normal, Trunk Movements Neck, shoulders, hips: None, normal, Overall Severity Severity of abnormal movements (highest score from questions above): None, normal Incapacitation due to abnormal movements: None, normal Patient's awareness of abnormal movements (rate only patient's report): No Awareness, Dental Status Current problems with teeth and/or dentures?: No Does patient usually wear dentures?: No  CIWA:  CIWA-Ar Total: 3 COWS:  COWS Total Score: 4  Musculoskeletal: Strength & Muscle Tone: within normal limits Gait & Station: normal Patient leans: N/A  Psychiatric Specialty Exam:  See MD SRA Physical Exam  Nursing note and vitals reviewed.   ROS  Blood pressure (!) 153/88, pulse 91, temperature 98.4 F (36.9 C), temperature source Oral, resp. rate 20, height 5\' 4"  (1.626 m), weight 99.8 kg (220 lb), SpO2 98 %.Body mass index is 37.76 kg/m.    Have you used any form of tobacco in the last 30 days? (Cigarettes, Smokeless Tobacco, Cigars, and/or Pipes): No  Has this patient used any form of tobacco in the last 30 days? (Cigarettes, Smokeless Tobacco, Cigars, and/or Pipes) Yes, N/A  Blood Alcohol level:  Lab Results  Component Value Date   ETH <5 07/22/2016   ETH <5 06/18/2015    Metabolic Disorder Labs:  Lab  Results  Component Value Date   HGBA1C 5.3 07/27/2016   MPG 105 07/27/2016   Lab Results  Component Value Date   PROLACTIN 56.5 (H) 07/27/2016   PROLACTIN 31.2 (H) 06/20/2015   Lab Results  Component Value Date   CHOL 163 07/27/2016   TRIG 166 (H) 07/27/2016   HDL 36 (L) 07/27/2016   CHOLHDL 4.5 07/27/2016   VLDL 33 07/27/2016   LDLCALC 94 07/27/2016   LDLCALC 141 (H) 06/20/2015    See Psychiatric Specialty Exam and Suicide Risk Assessment completed by Attending Physician prior to discharge.  Discharge destination:  Home  Is patient on multiple antipsychotic therapies at discharge:  No   Has Patient had three or more failed trials of antipsychotic monotherapy by history:  No  Recommended Plan for Multiple Antipsychotic Therapies: NA   Allergies as of 07/28/2016      Reactions   Shellfish Allergy Hives, Nausea And Vomiting      Medication List    STOP taking these medications   fish oil-omega-3 fatty acids 1000 MG capsule   LORazepam 1 MG tablet Commonly known as:  ATIVAN   multivitamin with minerals Tabs tablet   zolpidem 5 MG tablet Commonly known as:  AMBIEN     TAKE these medications     Indication  amLODipine 5 MG tablet Commonly known as:  NORVASC Take 1 tablet (5  mg total) by mouth daily. Start taking on:  07/29/2016  Indication:  High Blood Pressure Disorder   ARIPiprazole 10 MG tablet Commonly known as:  ABILIFY Take 1 tablet (10 mg total) by mouth daily. Start taking on:  07/29/2016 What changed:  medication strength  how much to take  Indication:  Manic Phase of Manic-Depression   carbamazepine 100 MG chewable tablet Commonly known as:  TEGRETOL Chew 1 tablet (100 mg total) by mouth 2 (two) times daily after a meal.  Indication:  Manic-Depression, Dyscontrol Syndrome   pantoprazole 40 MG tablet Commonly known as:  PROTONIX Take 1 tablet (40 mg total) by mouth 2 (two) times daily before a meal.  Indication:  Gastroesophageal Reflux  Disease      Follow-up Information    [facility] Follow up on 08/24/2016.   Specialty:  Behavioral Health Why:  Tuesday at 11:00 with Dr Adolphus Birchwood information: [address] [place] [address] [place] [address] [phone]          Follow-up recommendations:  Activity:  as tol Diet:  as tol  Comments:  1.  Take all your medications as prescribed.   2.  Report any adverse side effects to outpatient provider. 3.  Patient instructed to not use alcohol or illegal drugs while on prescription medicines. 4.  In the event of worsening symptoms, instructed patient to call 911, the crisis hotline or go to nearest emergency room for evaluation of symptoms.  Signed: Lindwood Qua, NP [facility] 07/28/2016, 9:55 AM

## 2016-07-28 NOTE — [facility] Suicide Risk Assessment (Signed)
[facility]   Principal Problem: Schizoaffective disorder, bipolar type [facility](HCC) Discharge Diagnoses:  Patient Active Problem List   Diagnosis Date Noted  . Schizoaffective disorder, bipolar type (HCC) [F25.0] 07/23/2016  . Paranoid schizophrenia (HCC) [F20.0]   . Schizophrenia (HCC) [F20.9] 06/20/2015  . URI (upper respiratory infection) [J06.9] 01/30/2013  . Prediabetes [R73.03] 01/30/2013  . Preventative health care [Z00.00] 11/23/2012  . Numbness and tingling in hands [R20.0, R20.2] 11/23/2012  . Elevated blood pressure reading without diagnosis of hypertension [R03.0] 10/31/2011  . Morbid obesity (HCC) [E66.01] 10/31/2011    Total Time spent with patient: 30 minutes  Musculoskeletal: Strength & Muscle Tone: within normal limits Gait & Station: normal Patient leans: N/A  Psychiatric Specialty Exam: Review of Systems  Psychiatric/Behavioral: Negative for depression and hallucinations.  All other systems reviewed and are negative.   Blood pressure (!) 153/88, pulse 91, temperature 98.4 F (36.9 C), temperature source Oral, resp. rate 20, height 5\' 4"  (1.626 m), weight 99.8 kg (220 lb), SpO2 98 %.Body mass index is 37.76 kg/m.  General Appearance: Casual  Eye Contact::  Fair  Speech:  Normal Rate409  Volume:  Normal  Mood:  Euthymic  Affect:  Appropriate  Thought Process:  Goal Directed and Descriptions of Associations: Intact  Orientation:  Full (Time, Place, and Person)  Thought Content:  Logical  Suicidal Thoughts:  No  Homicidal Thoughts:  No  Memory:  Immediate;   Fair Recent;   Fair Remote;   Fair  Judgement:  Fair  Insight:  Fair  Psychomotor Activity:  Normal  Concentration:  Fair  Recall:  [employer]  Fund of Knowledge:Fair  Language: Fair  Akathisia:  No  Handed:  Right  AIMS (if indicated):   0  Assets:  Communication Skills Desire for Improvement  Sleep:  Number of Hours: 6.75  Cognition: WNL  ADL's:  Intact   Mental Status  Per Nursing Assessment::   On Admission:     Demographic Factors:  Caucasian  Loss Factors: NA  Historical Factors: Impulsivity  Risk Reduction Factors:   Positive social support and Positive therapeutic relationship  Continued Clinical Symptoms:  Previous Psychiatric Diagnoses and Treatments  Cognitive Features That Contribute To Risk:  None    Suicide Risk:  Minimal: No identifiable suicidal ideation.  Patients presenting with no risk factors but with morbid ruminations; may be classified as minimal risk based on the severity of the depressive symptoms  Follow-up Information    [facility] Follow up.   Why:  Call LaToya tomorrow to find out when your appointment is scheduled for. Contact information: [address] [place] [address] [phone]           Plan Of Care/Follow-up recommendations:  Activity:  no restrictions Diet:  regular Tests:  Follow up on your prolactin level  Other:  please talk to your outpatient provider about your tegretol which was reduced due to you feeling groggy/dizzy. could titrrate up slowly.  Kathline Banbury, MD 07/28/2016, 9:13 AM

## 2016-07-28 NOTE — Progress Notes (Signed)
Nursing Discharge Note 07/28/2016 [id number]  Data Reports sleeping good without PRN sleep med.  Rates depression 0/10, hopelessness 0/10, and anxiety 0/10. Affect appropriate, mood euthymic.  Denies HI, SI, AVH.  Still has vague somatic complaints, mild pain/pressure in side (MD aware).  Attending groups.  Interacting well with peers and staff.  Received discharge orders.  Action Spoke with patient 1:1, nurse offered support to patient throughout shift.  Reviewed medications, discharge instructions, and follow up appointments with patient. Medication samples and scripts handed to patient.  Paperwork, AVS, SRA, and transition record handed to patient.   Escorted off of unit at 1215. Belongings returned per belongings form.  Discharged to lobby, given a bus pass.    Response Verbalized understanding of discharge teaching. Agrees to contact someone or 911 with thoughts/intent to harm self or others.    To follow up per AVS.

## 2016-07-28 NOTE — Progress Notes (Signed)
Recreation Therapy Notes  Date: 07/28/16 Time: 1000 Location: 500 Hall Dayroom  Group Topic: Wellness  Goal Area(s) Addresses:  Patient will define components of whole wellness. Patient will verbalize benefit of whole wellness.  Behavioral Response: Engaged  Intervention:  ArchivistChairs, beach ball   Activity: Keep it ContractorGoing Volleyball.  Patients were positioned in a circle.  Patients were to toss a beach ball back and forth to each other without letting the ball come to a stop on the floor.  Patients could bounce the ball off the floor but the ball had to keep moving within the circle.  LRT would count the number hits on the ball.  Once the ball came to a complete stop the count started over.  Education: Wellness, Building control surveyorDischarge Planning.   Education Outcome: Acknowledges education/In group clarification offered/Needs additional education.   Clinical Observations/Feedback: Pt participated in the activity.  Pt seemed to be focused on her peer and kept asking him if he was ok.  Pt left but later returned and resumed participation in activity.   Caroll RancherMarjette Maura Braaten, LRT/CTRS      Caroll RancherLindsay, Tray Klayman A 07/28/2016 11:58 AM

## 2016-07-28 NOTE — Progress Notes (Signed)
  [facility] Discharge [facility] :  Will you be returning to the same living situation after discharge:  Yes,  home At discharge, do you have transportation home?: Yes,  mother or bus pass Do you have the ability to pay for your medications: Yes,  says she has insurance  Release of information consent forms completed and in the chart;  Patient's signature needed at discharge.  Patient to Follow up at: Follow-up Information    [facility] Follow up on 08/24/2016.   Specialty:  Behavioral Health Why:  Tuesday at 11:00 with Dr Adolphus BirchwoodAlex Contact information: [address] [place] [id number] [phone]          Next level of care provider [facility] Link:yes  Safety Planning and Suicide Prevention discussed: Yes,  yes  Have you used any form of tobacco in the last 30 days? (Cigarettes, Smokeless Tobacco, Cigars, and/or Pipes): No  Has patient been referred to the Quitline?: N/A patient is not a smoker  Patient has been referred for addiction treatment: N/A  Ida RogueRodney B Asante Ritacco 07/28/2016, 9:55 AM

## 2016-08-03 ENCOUNTER — Ambulatory Visit (HOSPITAL_COMMUNITY): Payer: Self-pay | Admitting: Psychiatry

## 2016-08-24 ENCOUNTER — Ambulatory Visit (HOSPITAL_COMMUNITY): Payer: Self-pay | Admitting: Psychiatry

## 2017-11-13 ENCOUNTER — Ambulatory Visit (HOSPITAL_COMMUNITY)
Admission: EM | Admit: 2017-11-13 | Discharge: 2017-11-13 | Disposition: A | Discharge status: Home / Self Care | Payer: BLUE CROSS/BLUE SHIELD | Attending: Family Medicine | Admitting: Family Medicine

## 2017-11-13 ENCOUNTER — Encounter (HOSPITAL_COMMUNITY): Payer: Self-pay | Admitting: Emergency Medicine

## 2017-11-13 ENCOUNTER — Emergency Department (HOSPITAL_COMMUNITY)
Admission: EM | Admit: 2017-11-13 | Discharge: 2017-11-14 | Disposition: A | Discharge status: Home / Self Care | Payer: 59 | Attending: Emergency Medicine | Admitting: Emergency Medicine

## 2017-11-13 ENCOUNTER — Other Ambulatory Visit: Payer: Self-pay

## 2017-11-13 DIAGNOSIS — F17210 Nicotine dependence, cigarettes, uncomplicated: Secondary | ICD-10-CM | POA: Diagnosis not present

## 2017-11-13 DIAGNOSIS — R1032 Left lower quadrant pain: Secondary | ICD-10-CM | POA: Diagnosis not present

## 2017-11-13 DIAGNOSIS — I10 Essential (primary) hypertension: Secondary | ICD-10-CM | POA: Insufficient documentation

## 2017-11-13 DIAGNOSIS — K5732 Diverticulitis of large intestine without perforation or abscess without bleeding: Secondary | ICD-10-CM | POA: Insufficient documentation

## 2017-11-13 DIAGNOSIS — Z79899 Other long term (current) drug therapy: Secondary | ICD-10-CM | POA: Insufficient documentation

## 2017-11-13 DIAGNOSIS — R102 Pelvic and perineal pain: Secondary | ICD-10-CM | POA: Diagnosis not present

## 2017-11-13 DIAGNOSIS — R509 Fever, unspecified: Secondary | ICD-10-CM | POA: Diagnosis not present

## 2017-11-13 DIAGNOSIS — R300 Dysuria: Secondary | ICD-10-CM | POA: Diagnosis not present

## 2017-11-13 DIAGNOSIS — Z7251 High risk heterosexual behavior: Secondary | ICD-10-CM

## 2017-11-13 DIAGNOSIS — R1030 Lower abdominal pain, unspecified: Secondary | ICD-10-CM | POA: Diagnosis present

## 2017-11-13 DIAGNOSIS — K5792 Diverticulitis of intestine, part unspecified, without perforation or abscess without bleeding: Secondary | ICD-10-CM

## 2017-11-13 LAB — COMPREHENSIVE METABOLIC PANEL
ALT: 19 U/L (ref 14–54)
AST: 13 U/L — AB (ref 15–41)
Albumin: 3.8 g/dL (ref 3.5–5.0)
Alkaline Phosphatase: 54 U/L (ref 38–126)
Anion gap: 10 (ref 5–15)
BILIRUBIN TOTAL: 0.8 mg/dL (ref 0.3–1.2)
BUN: 11 mg/dL (ref 6–20)
CHLORIDE: 104 mmol/L (ref 101–111)
CO2: 21 mmol/L — ABNORMAL LOW (ref 22–32)
Calcium: 8.4 mg/dL — ABNORMAL LOW (ref 8.9–10.3)
Creatinine, Ser: 0.85 mg/dL (ref 0.44–1.00)
Glucose, Bld: 115 mg/dL — ABNORMAL HIGH (ref 65–99)
POTASSIUM: 3.6 mmol/L (ref 3.5–5.1)
Sodium: 135 mmol/L (ref 135–145)
TOTAL PROTEIN: 7.4 g/dL (ref 6.5–8.1)

## 2017-11-13 LAB — CBC
HCT: 39.9 % (ref 36.0–46.0)
Hemoglobin: 12.8 g/dL (ref 12.0–15.0)
MCH: 30.8 pg (ref 26.0–34.0)
MCHC: 32.1 g/dL (ref 30.0–36.0)
MCV: 96.1 fL (ref 78.0–100.0)
Platelets: 257 10*3/uL (ref 150–400)
RBC: 4.15 MIL/uL (ref 3.87–5.11)
RDW: 13.2 % (ref 11.5–15.5)
WBC: 16 10*3/uL — AB (ref 4.0–10.5)

## 2017-11-13 LAB — I-STAT BETA HCG BLOOD, ED (MC, WL, AP ONLY): I-stat hCG, quantitative: 5.8 m[IU]/mL — ABNORMAL HIGH (ref <5)

## 2017-11-13 LAB — POCT URINALYSIS DIP (DEVICE)
Bilirubin Urine: NEGATIVE
Glucose, UA: NEGATIVE mg/dL
Ketones, ur: NEGATIVE mg/dL
LEUKOCYTES UA: NEGATIVE
Nitrite: NEGATIVE
PH: 5.5 (ref 5.0–8.0)
PROTEIN: NEGATIVE mg/dL
Specific Gravity, Urine: 1.015 (ref 1.005–1.030)
UROBILINOGEN UA: 0.2 mg/dL (ref 0.0–1.0)

## 2017-11-13 LAB — POCT I-STAT, CHEM 8
BUN: 10 mg/dL (ref 6–20)
CALCIUM ION: 1.17 mmol/L (ref 1.15–1.40)
CREATININE: 0.8 mg/dL (ref 0.44–1.00)
Chloride: 101 mmol/L (ref 101–111)
GLUCOSE: 107 mg/dL — AB (ref 65–99)
HCT: 42 % (ref 36.0–46.0)
HEMOGLOBIN: 14.3 g/dL (ref 12.0–15.0)
Potassium: 3.9 mmol/L (ref 3.5–5.1)
Sodium: 139 mmol/L (ref 135–145)
TCO2: 25 mmol/L (ref 22–32)

## 2017-11-13 LAB — LIPASE, BLOOD: LIPASE: 25 U/L (ref 11–51)

## 2017-11-13 MED ORDER — ACETAMINOPHEN 325 MG PO TABS
650.0000 mg | ORAL_TABLET | Freq: Once | ORAL | Status: AC
  Administered 2017-11-13: 650 mg via ORAL

## 2017-11-13 MED ORDER — ACETAMINOPHEN 325 MG PO TABS
ORAL_TABLET | ORAL | Status: AC
  Filled 2017-11-13: qty 2

## 2017-11-13 NOTE — ED Provider Notes (Signed)
MRN: [id number] DOB: 31-Oct-1965  Subjective:   Margaret Christian is a 52 y.o. female presenting for 1 day history of severe worsening pelvic pain, vomited today, fever, chills. Patient feels that she has an incisional hernia from her C-section (2001) but has not had that evaluated. Has had chronic intermittent abdominal pain since her C-section but not like today's. Denies dysuria, hematuria, genital rashes, vaginal discharge. Patient had unprotected sex Friday night with her partner of 26 years. Has had a headache since yesterday as well, started after eating at [employer], her partner also had a headache. LMP was 10/23/2017, was regular.   No current facility-administered medications for this encounter.   Current Outpatient Medications:  .  amLODipine (NORVASC) 5 MG tablet, Take 1 tablet (5 mg total) by mouth daily., Disp: 30 tablet, Rfl: 0 .  ARIPiprazole (ABILIFY) 10 MG tablet, Take 1 tablet (10 mg total) by mouth daily., Disp: 30 tablet, Rfl: 0 .  carbamazepine (TEGRETOL) 100 MG chewable tablet, Chew 1 tablet (100 mg total) by mouth 2 (two) times daily after a meal., Disp: 60 tablet, Rfl: 0 .  pantoprazole (PROTONIX) 40 MG tablet, Take 1 tablet (40 mg total) by mouth 2 (two) times daily before a meal., Disp: 60 tablet, Rfl: 0    Allergies  Allergen Reactions  . Shellfish Allergy Hives and Nausea And Vomiting    Past Medical History:  Diagnosis Date  . Hypertension   . Paranoid schizophrenia (HCC)   . Phlebitis      Past Surgical History:  Procedure Laterality Date  . CESAREAN SECTION  2001    Objective:   Vitals: BP (!) 155/89 (BP Location: Left Arm)   Pulse (!) 108   Temp (!) 101.2 F (38.4 C) (Oral)   Resp 18   SpO2 98%   Physical Exam  Constitutional: She is oriented to person, place, and time. She appears well-developed and well-nourished.  HENT:  Mouth/Throat: Oropharynx is clear and moist.  Eyes: No scleral icterus.  Cardiovascular: Normal rate, regular rhythm and  intact distal pulses. Exam reveals no gallop and no friction rub.  No murmur heard. Pulmonary/Chest: No respiratory distress. She has no wheezes. She has no rales.  Abdominal: She exhibits no distension and no mass. There is no hepatosplenomegaly. There is tenderness (exquisite) in the suprapubic area and left lower quadrant. There is rebound. There is no rigidity, no guarding, no CVA tenderness, no tenderness at McBurney's point and negative Murphy's sign.  Well healed horizontal C-section scar.  Neurological: She is alert and oriented to person, place, and time.  Skin: Skin is warm and dry.  Psychiatric:  Irritable.   Results for orders placed or performed during the hospital encounter of 11/13/17 (from the past 24 hour(s))  POCT urinalysis dip (device)     Status: Abnormal   Collection Time: 11/13/17  8:33 PM  Result Value Ref Range   Glucose, UA NEGATIVE NEGATIVE mg/dL   Bilirubin Urine NEGATIVE NEGATIVE   Ketones, ur NEGATIVE NEGATIVE mg/dL   Specific Gravity, Urine 1.015 1.005 - 1.030   Hgb urine dipstick MODERATE (A) NEGATIVE   pH 5.5 5.0 - 8.0   Protein, ur NEGATIVE NEGATIVE mg/dL   Urobilinogen, UA 0.2 0.0 - 1.0 mg/dL   Nitrite NEGATIVE NEGATIVE   Leukocytes, UA NEGATIVE NEGATIVE  I-STAT, chem 8     Status: Abnormal   Collection Time: 11/13/17  8:38 PM  Result Value Ref Range   Sodium 139 135 - 145 mmol/L   Potassium 3.9 3.5 -  5.1 mmol/L   Chloride 101 101 - 111 mmol/L   BUN 10 6 - 20 mg/dL   Creatinine, Ser 1.47 0.44 - 1.00 mg/dL   Glucose, Bld 829 (H) 65 - 99 mg/dL   Calcium, Ion 5.62 1.30 - 1.40 mmol/L   TCO2 25 22 - 32 mmol/L   Hemoglobin 14.3 12.0 - 15.0 g/dL   HCT 86.5 78.4 - 69.6 %    Assessment and Plan :   Pelvic pain  Dysuria  Acute abdominal pain in left lower quadrant  Unprotected sex  Directed patient to [facility] ER to rule out acute abdomen. Counseled patient on differential which includes diverticulitis, PID, acute abdomen. Patient verbalized  understanding and agreed to report to the ER immediately.   Wallis Bamberg, PA-C 11/13/17 2121

## 2017-11-13 NOTE — ED Notes (Signed)
2 gold tops sent to main lab to hold

## 2017-11-13 NOTE — ED Triage Notes (Signed)
[facility] for [facility] of abd pain. Present since Saturday, pt also reports subjective fevers. Denies N/V/D.

## 2017-11-13 NOTE — Discharge Instructions (Addendum)
Please report to the ER immediately for your acute abdominal pain. My hope is that you will get an abdominal/pelvic CT scan to rule out an acute abdomen.

## 2017-11-13 NOTE — ED Triage Notes (Signed)
Pt sts lower abd pain and fever; pt also requests eval for tick bite

## 2017-11-14 ENCOUNTER — Emergency Department (HOSPITAL_COMMUNITY): Payer: 59

## 2017-11-14 DIAGNOSIS — K5732 Diverticulitis of large intestine without perforation or abscess without bleeding: Secondary | ICD-10-CM | POA: Diagnosis not present

## 2017-11-14 LAB — GC/CHLAMYDIA PROBE AMP (~~LOC~~) NOT AT ARMC
Chlamydia: NEGATIVE
Neisseria Gonorrhea: NEGATIVE

## 2017-11-14 LAB — URINALYSIS, ROUTINE W REFLEX MICROSCOPIC
BILIRUBIN URINE: NEGATIVE
GLUCOSE, UA: NEGATIVE mg/dL
KETONES UR: NEGATIVE mg/dL
LEUKOCYTES UA: NEGATIVE
Nitrite: NEGATIVE
PH: 5 (ref 5.0–8.0)
PROTEIN: NEGATIVE mg/dL
Specific Gravity, Urine: 1.021 (ref 1.005–1.030)

## 2017-11-14 LAB — HIV ANTIBODY (ROUTINE TESTING W REFLEX): HIV Screen 4th Generation wRfx: NONREACTIVE

## 2017-11-14 LAB — POC URINE PREG, ED: PREG TEST UR: NEGATIVE

## 2017-11-14 LAB — RPR: RPR: NONREACTIVE

## 2017-11-14 IMAGING — CT CT ABD-PELV W/ CM
2 of 5 series · 16 of 46 positions shown, 18 images · IV contrast (Omni 300)
Comparison: None.

CLINICAL DATA: Abdominal pain since [REDACTED].  Fevers.

EXAM:
CT ABDOMEN AND PELVIS WITH CONTRAST
TECHNIQUE: Multidetector CT imaging of the abdomen and pelvis was performed
using the standard protocol following bolus administration of
intravenous contrast.
CONTRAST:  100mL OMNIPAQUE IOHEXOL 300 MG/ML  SOLN

[Series 3: a/p w/ 5mm · axial · 0.98mm/px · z∈[+468,+898]mm · 13 of 98 slices shown, 15 images]
[im 6/98  soft-tissue]
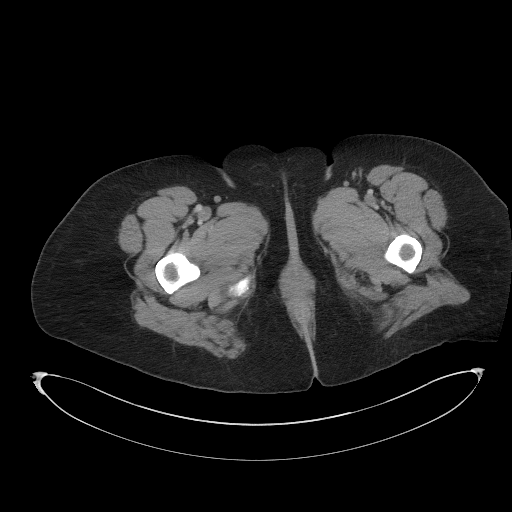
[im 6/98  bone]
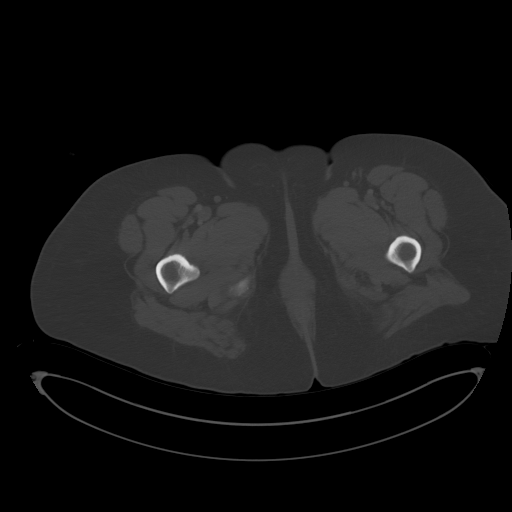
[im 16/98  soft-tissue]
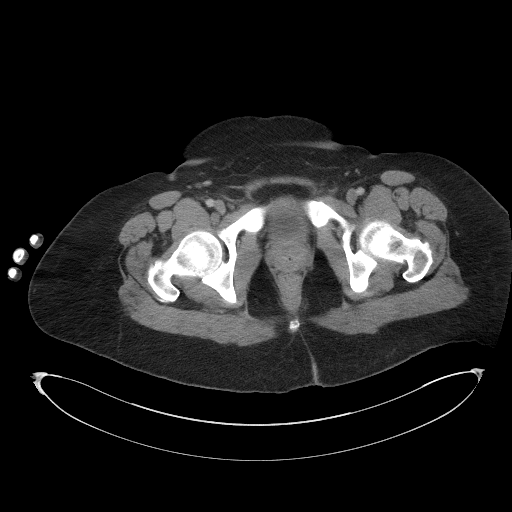
[im 21/98  soft-tissue]
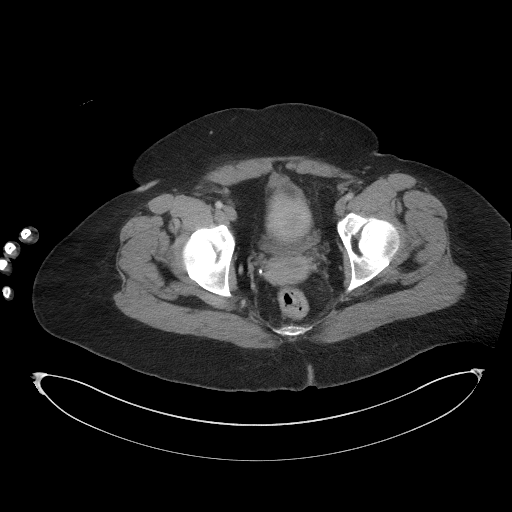
[im 26/98  soft-tissue]
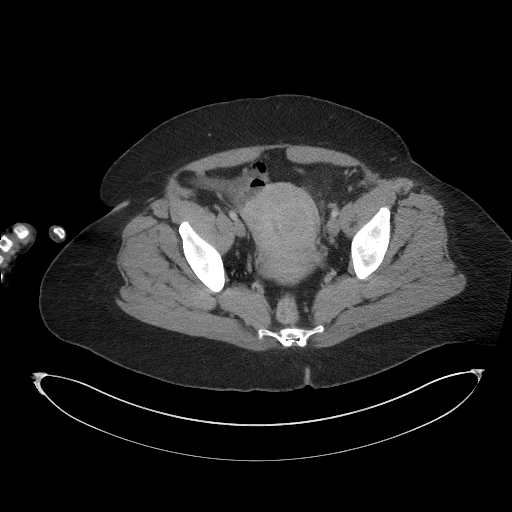
[im 36/98  soft-tissue]
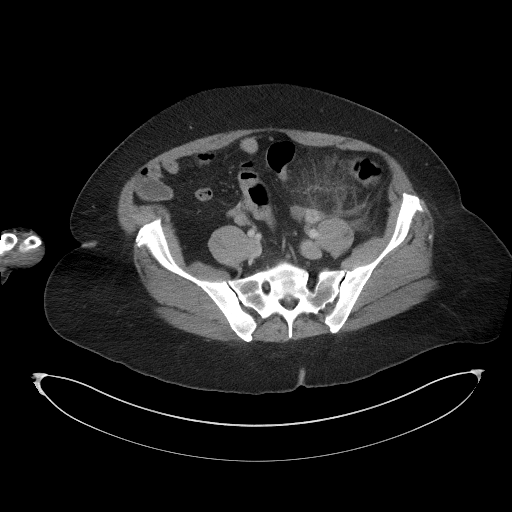
[im 41/98  soft-tissue]
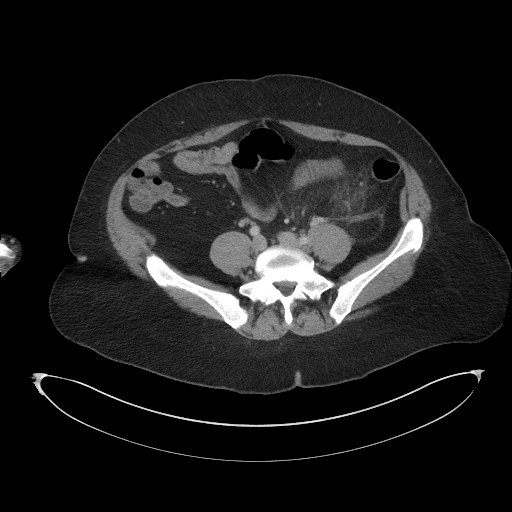
[im 52/98  soft-tissue]
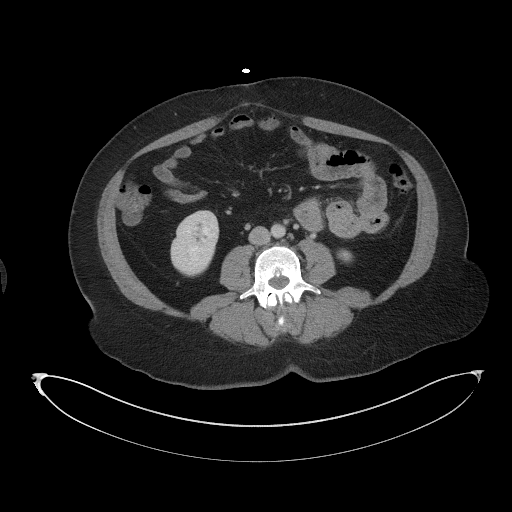
[im 57/98  soft-tissue]
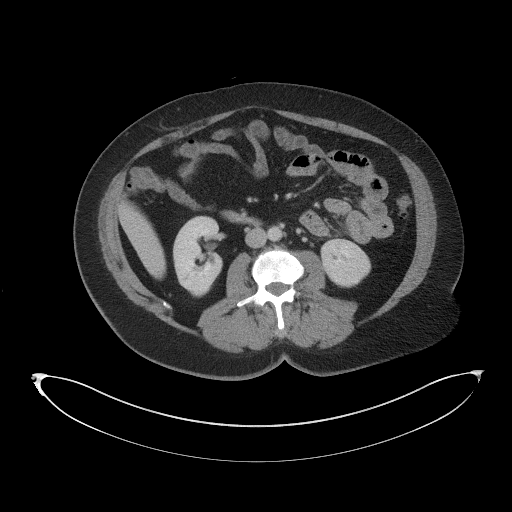
[im 62/98  soft-tissue]
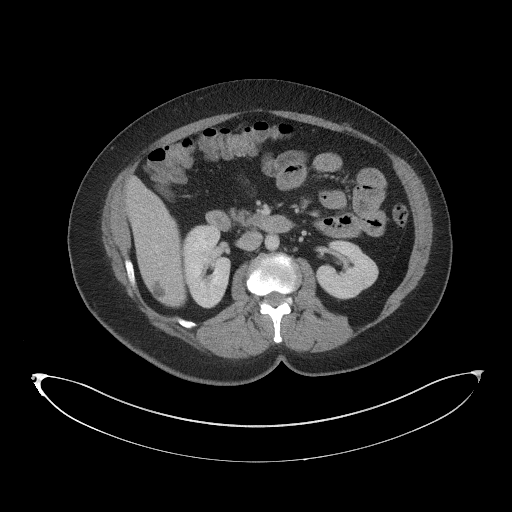
[im 62/98  bone]
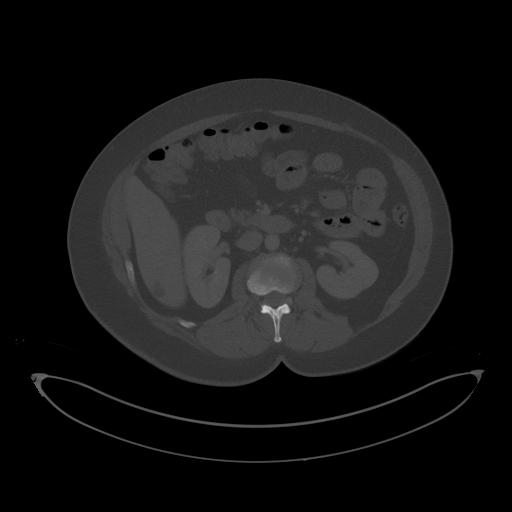
[im 72/98  soft-tissue]
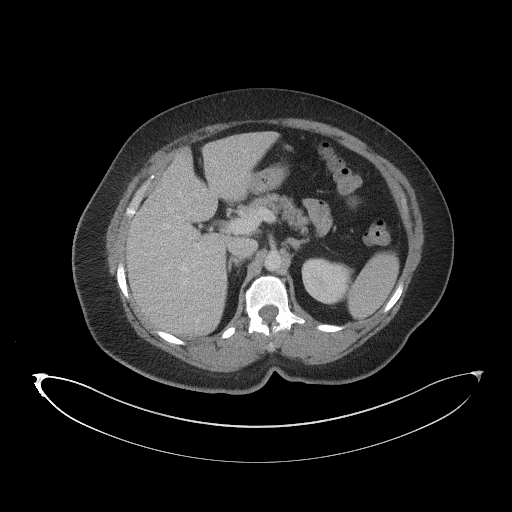
[im 77/98  soft-tissue]
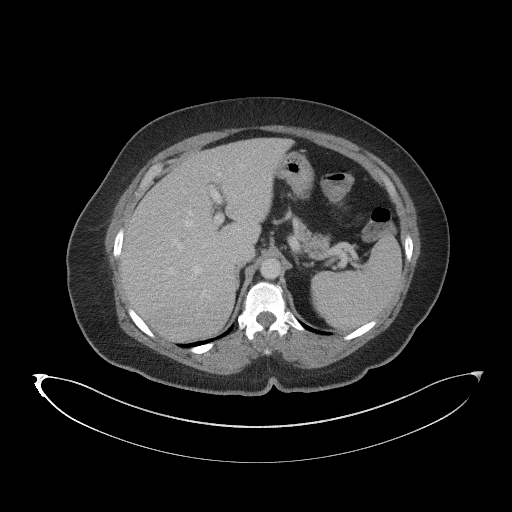
[im 82/98  soft-tissue]
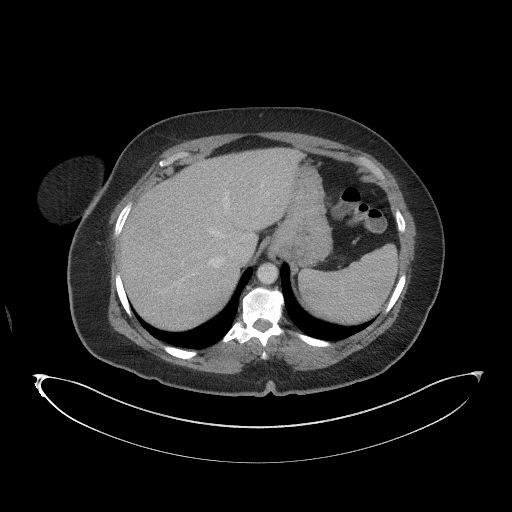
[im 92/98  soft-tissue]
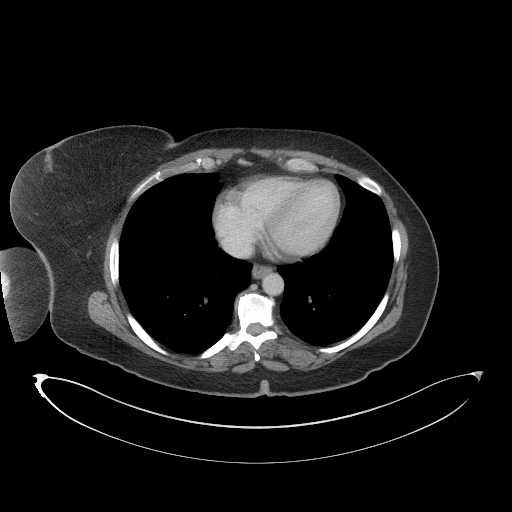

[Series 6: a/p w/ cor · coronal · 0.93mm/px · 3 of 178 slices shown]
[im 60/178  soft-tissue]
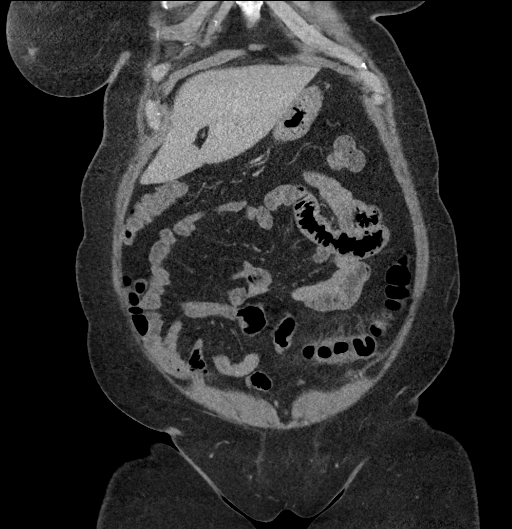
[im 79/178  soft-tissue]
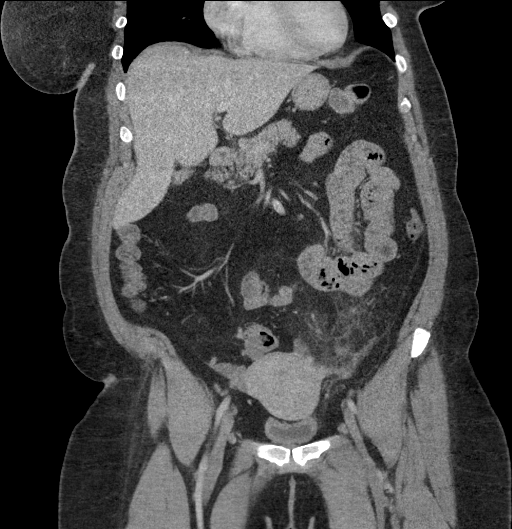
[im 99/178  soft-tissue]
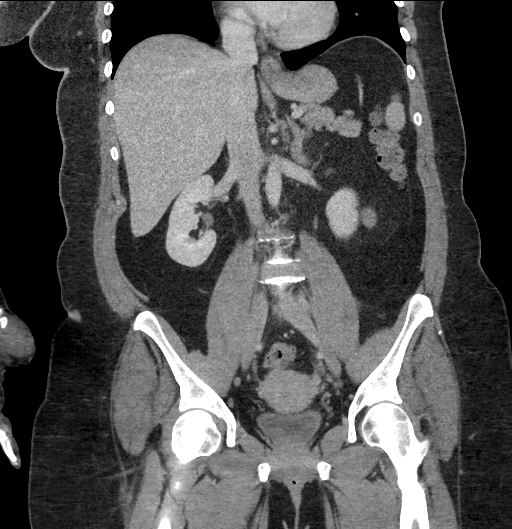

[16 of 46 positions shown; findings below may reference images not displayed]

FINDINGS: Lower chest: Lung bases are clear.  Small esophageal hiatal hernia.

Hepatobiliary: Benign-appearing cyst in the posterior right lobe of
the liver. No other focal liver lesions. Gallbladder and bile ducts
are unremarkable.

Pancreas: Unremarkable. No pancreatic ductal dilatation or
surrounding inflammatory changes.

Spleen: Normal in size without focal abnormality.

Adrenals/Urinary Tract: Adrenal glands are unremarkable. Kidneys are
normal, without renal calculi, focal lesion, or hydronephrosis.
Bladder wall is thickened, either due to under distention or
cystitis. Correlation with urinalysis.

Stomach/Bowel: Stomach, small bowel, and colon are mostly
decompressed. No abnormal distention. There are scattered
diverticula in the sigmoid colon. There is infiltration and
stranding throughout the pericolonic fat in the sigmoid region of
the left lower quadrant consistent with acute diverticulitis. No
abscess. Appendix is normal.

Vascular/Lymphatic: No significant vascular findings are present. No
enlarged abdominal or pelvic lymph nodes.

Reproductive: Anterior myometrial nodules consistent with uterine
fibroids. No abnormal adnexal masses.

Other: No free air or free fluid in the abdomen. Abdominal wall
musculature appears intact.

Musculoskeletal: Degenerative changes in the spine. No destructive
bone lesions.
IMPRESSION: 1. Diverticula in the sigmoid colon. Inflammatory stranding in the
left lower quadrant around the sigmoid colon consistent with acute
diverticulitis. No abscess.
2. Bladder wall is thickened which may indicate cystitis or may be
due to under distention. Correlate with urinalysis.
3. Uterine fibroids.

## 2017-11-14 MED ORDER — METRONIDAZOLE 500 MG PO TABS: 500.0000 mg | ORAL_TABLET | Freq: Three times a day (TID) | ORAL | 0 refills | Status: AC

## 2017-11-14 MED ORDER — MORPHINE SULFATE (PF) 4 MG/ML IV SOLN
4.0000 mg | Freq: Once | INTRAVENOUS | Status: DC
  Filled 2017-11-14: qty 1

## 2017-11-14 MED ORDER — CIPROFLOXACIN HCL 500 MG PO TABS: 500.0000 mg | ORAL_TABLET | Freq: Two times a day (BID) | ORAL | 0 refills | Status: DC

## 2017-11-14 MED ORDER — IOHEXOL 300 MG/ML  SOLN
100.0000 mL | Freq: Once | INTRAMUSCULAR | Status: AC | PRN
  Administered 2017-11-14: 100 mL via INTRAVENOUS

## 2017-11-14 MED ORDER — IBUPROFEN 800 MG PO TABS: 800.0000 mg | ORAL_TABLET | Freq: Three times a day (TID) | ORAL | 0 refills | Status: AC | PRN

## 2017-11-14 MED ORDER — METRONIDAZOLE 500 MG PO TABS
500.0000 mg | ORAL_TABLET | Freq: Once | ORAL | Status: AC
  Administered 2017-11-14: 500 mg via ORAL
  Filled 2017-11-14: qty 1

## 2017-11-14 MED ORDER — CIPROFLOXACIN HCL 500 MG PO TABS
500.0000 mg | ORAL_TABLET | Freq: Once | ORAL | Status: AC
  Administered 2017-11-14: 500 mg via ORAL
  Filled 2017-11-14: qty 1

## 2017-11-14 MED ORDER — IBUPROFEN 400 MG PO TABS
400.0000 mg | ORAL_TABLET | Freq: Once | ORAL | Status: AC
  Administered 2017-11-14: 400 mg via ORAL
  Filled 2017-11-14: qty 1

## 2017-11-14 MED ORDER — ONDANSETRON HCL 4 MG/2ML IJ SOLN
4.0000 mg | Freq: Once | INTRAMUSCULAR | Status: DC
  Filled 2017-11-14: qty 2

## 2017-11-14 NOTE — Discharge Instructions (Signed)
Return to the ER if you continue to have a high fever or your pain worsens.

## 2017-11-14 NOTE — ED Notes (Signed)
Pt returned from CT °

## 2017-11-14 NOTE — ED Notes (Addendum)
IV attempted x2 without success; second RN to attempt  

## 2017-11-14 NOTE — ED Notes (Signed)
POC urine preg negative 

## 2017-11-14 NOTE — ED Notes (Signed)
Pt requesting venereal testing and lyme disease testing. MD informed per pt request

## 2017-11-14 NOTE — ED Notes (Signed)
Pt requesting ibuprofen for pain, declined morphine. Verbal orders obtained

## 2017-11-14 NOTE — ED Provider Notes (Signed)
[facility] EMERGENCY DEPARTMENT Provider Note   CSN: [id number] Arrival date & time: 11/13/17  2135     History   Chief Complaint Chief Complaint  Patient presents with  . Abdominal Pain    HPI Margaret Christian is a 52 y.o. female.  Patient presents to emergency department for evaluation of abdominal pain with fever.  She presented to urgent care earlier today and was referred to the ER.  Patient started having pain in her lower abdomen yesterday.  She thinks that it centered around her old C-section scar.  Pain is in the left side of the lower abdomen and pelvis, worsens with movement now.  She started running a fever today.  She has had chills and sweats.  She has not had nausea, vomiting, diarrhea.  She denies urinary symptoms.     Past Medical History:  Diagnosis Date  . Hypertension   . Paranoid schizophrenia (HCC)   . Phlebitis     Patient Active Problem List   Diagnosis Date Noted  . Schizoaffective disorder, bipolar type (HCC) 07/23/2016  . Paranoid schizophrenia (HCC)   . Schizophrenia (HCC) 06/20/2015  . URI (upper respiratory infection) 01/30/2013  . Prediabetes 01/30/2013  . Preventative health care 11/23/2012  . Numbness and tingling in hands 11/23/2012  . Elevated blood pressure reading without diagnosis of hypertension 10/31/2011  . Morbid obesity (HCC) 10/31/2011    Past Surgical History:  Procedure Laterality Date  . CESAREAN SECTION  2001     OB History    Gravida  0   Para  0   Term  0   Preterm  0   AB  0   Living        SAB  0   TAB  0   Ectopic  0   Multiple      Live Births               Home Medications    Prior to Admission medications   Medication Sig Start Date End Date Taking? Authorizing Provider  amLODipine (NORVASC) 5 MG tablet Take 1 tablet (5 mg total) by mouth daily. Patient not taking: Reported on 11/14/2017 07/29/16   Adonis Brook, NP  ARIPiprazole (ABILIFY) 10 MG tablet Take 1  tablet (10 mg total) by mouth daily. Patient not taking: Reported on 11/14/2017 07/29/16   Adonis Brook, NP  carbamazepine (TEGRETOL) 100 MG chewable tablet Chew 1 tablet (100 mg total) by mouth 2 (two) times daily after a meal. Patient not taking: Reported on 11/14/2017 07/28/16   Adonis Brook, NP  ciprofloxacin (CIPRO) 500 MG tablet Take 1 tablet (500 mg total) by mouth 2 (two) times daily. 11/14/17   Gilda Crease, MD  ibuprofen (ADVIL,MOTRIN) 800 MG tablet Take 1 tablet (800 mg total) by mouth every 8 (eight) hours as needed. 11/14/17   Gilda Crease, MD  metroNIDAZOLE (FLAGYL) 500 MG tablet Take 1 tablet (500 mg total) by mouth 3 (three) times daily. 11/14/17   Gilda Crease, MD  pantoprazole (PROTONIX) 40 MG tablet Take 1 tablet (40 mg total) by mouth 2 (two) times daily before a meal. Patient not taking: Reported on 11/14/2017 07/28/16   Adonis Brook, NP    Family History Family History  Problem Relation Age of Onset  . Heart failure Father   . Hypertension Father   . Hypertension Brother   . Diabetes type II Paternal Grandfather   . Aortic aneurysm Maternal Grandfather   . Coronary artery  disease Maternal Grandfather   . Stroke Maternal Grandfather     Social History Social History   Tobacco Use  . Smoking status: Light Tobacco Smoker    Types: Cigarettes  . Smokeless tobacco: Never Used  Substance Use Topics  . Alcohol use: Yes    Alcohol/week: 0.0 oz  . Drug use: No     Allergies   Shellfish allergy   Review of Systems Review of Systems  Constitutional: Positive for fever.  Gastrointestinal: Positive for abdominal pain.  All other systems reviewed and are negative.    Physical Exam Updated Vital Signs BP (!) 161/97 (BP Location: Right Arm)   Pulse (!) 101   Temp 98.7 F (37.1 C) (Oral)   Resp 18   Ht 5\' 6"  (1.676 m)   Wt 104.3 kg (230 lb)   SpO2 99%   BMI 37.12 kg/m   Physical Exam  Constitutional: She is oriented to  person, place, and time. She appears well-developed and well-nourished. No distress.  HENT:  Head: Normocephalic and atraumatic.  Right Ear: Hearing normal.  Left Ear: Hearing normal.  Nose: Nose normal.  Mouth/Throat: Oropharynx is clear and moist and mucous membranes are normal.  Eyes: Pupils are equal, round, and reactive to light. Conjunctivae and EOM are normal.  Neck: Normal range of motion. Neck supple.  Cardiovascular: Regular rhythm, S1 normal and S2 normal. Exam reveals no gallop and no friction rub.  No murmur heard. Pulmonary/Chest: Effort normal and breath sounds normal. No respiratory distress. She exhibits no tenderness.  Abdominal: Soft. Normal appearance and bowel sounds are normal. There is no hepatosplenomegaly. There is tenderness in the left lower quadrant. There is guarding. There is no rebound, no tenderness at McBurney's point and negative Murphy's sign. No hernia.  Musculoskeletal: Normal range of motion.  Neurological: She is alert and oriented to person, place, and time. She has normal strength. No cranial nerve deficit or sensory deficit. Coordination normal. GCS eye subscore is 4. GCS verbal subscore is 5. GCS motor subscore is 6.  Skin: Skin is warm, dry and intact. No rash noted. No cyanosis.  Psychiatric: She has a normal mood and affect. Her speech is normal and behavior is normal. Thought content normal.  Nursing note and vitals reviewed.    ED Treatments / Results  Labs (all labs ordered are listed, but only abnormal results are displayed) Labs Reviewed  COMPREHENSIVE METABOLIC PANEL - Abnormal; Notable for the following components:      Result Value   CO2 21 (*)    Glucose, Bld 115 (*)    Calcium 8.4 (*)    AST 13 (*)    All other components within normal limits  CBC - Abnormal; Notable for the following components:   WBC 16.0 (*)    All other components within normal limits  I-STAT BETA HCG BLOOD, ED ([facility], [facility], [facility] ONLY) - Abnormal; Notable for the  following components:   I-stat hCG, quantitative 5.8 (*)    All other components within normal limits  LIPASE, BLOOD  B. BURGDORFI ANTIBODIES  HIV ANTIBODY (ROUTINE TESTING)  RPR  POC URINE PREG, ED  GC/CHLAMYDIA PROBE AMP ([place]) NOT AT [facility]    EKG None  Radiology Ct Abdomen Pelvis W Contrast  Result Date: 11/14/2017 CLINICAL DATA:  Abdominal pain since Saturday.  Fevers. EXAM: CT ABDOMEN AND PELVIS WITH CONTRAST TECHNIQUE: Multidetector CT imaging of the abdomen and pelvis was performed using the standard protocol following bolus administration of intravenous contrast. CONTRAST:  100mL OMNIPAQUE IOHEXOL 300 MG/ML  SOLN COMPARISON:  None. FINDINGS: Lower chest: Lung bases are clear.  Small esophageal hiatal hernia. Hepatobiliary: Benign-appearing cyst in the posterior right lobe of the liver. No other focal liver lesions. Gallbladder and bile ducts are unremarkable. Pancreas: Unremarkable. No pancreatic ductal dilatation or surrounding inflammatory changes. Spleen: Normal in size without focal abnormality. Adrenals/Urinary Tract: Adrenal glands are unremarkable. Kidneys are normal, without renal calculi, focal lesion, or hydronephrosis. Bladder wall is thickened, either due to under distention or cystitis. Correlation with urinalysis. Stomach/Bowel: Stomach, small bowel, and colon are mostly decompressed. No abnormal distention. There are scattered diverticula in the sigmoid colon. There is infiltration and stranding throughout the pericolonic fat in the sigmoid region of the left lower quadrant consistent with acute diverticulitis. No abscess. Appendix is normal. Vascular/Lymphatic: No significant vascular findings are present. No enlarged abdominal or pelvic lymph nodes. Reproductive: Anterior myometrial nodules consistent with uterine fibroids. No abnormal adnexal masses. Other: No free air or free fluid in the abdomen. Abdominal wall musculature appears intact. Musculoskeletal:  Degenerative changes in the spine. No destructive bone lesions. IMPRESSION: 1. Diverticula in the sigmoid colon. Inflammatory stranding in the left lower quadrant around the sigmoid colon consistent with acute diverticulitis. No abscess. 2. Bladder wall is thickened which may indicate cystitis or may be due to under distention. Correlate with urinalysis. 3. Uterine fibroids. Electronically Signed   By: Burman NievesWilliam  Stevens M.D.   On: 11/14/2017 02:57    Procedures Procedures (including critical care time)  Medications Ordered in ED Medications  morphine 4 MG/ML injection 4 mg (4 mg Intravenous Refused 11/14/17 0111)  ondansetron (ZOFRAN) injection 4 mg (4 mg Intravenous Refused 11/14/17 0111)  ibuprofen (ADVIL,MOTRIN) tablet 400 mg (400 mg Oral Given 11/14/17 0114)  iohexol (OMNIPAQUE) 300 MG/ML solution 100 mL (100 mLs Intravenous Contrast Given 11/14/17 0226)  ciprofloxacin (CIPRO) tablet 500 mg (500 mg Oral Given 11/14/17 0341)  metroNIDAZOLE (FLAGYL) tablet 500 mg (500 mg Oral Given 11/14/17 0341)     Initial Impression / Assessment and Plan / ED Course  I have reviewed the triage vital signs and the nursing notes.  Pertinent labs & imaging results that were available during my care of the patient were reviewed by me and considered in my medical decision making (see chart for details).     Patient presents with complaints of abdominal pain for one day and fever.  Patient had tenderness and guarding in the left lower quadrant without peritonitis.  She appears well.  She did have a fever here in the ER which is explained by the infection.  There does not appear to be any complicating factors on CT.  She appears well, nontoxic.  I discussed findings with patient. At this point she became angry because she thinks that she has Lyme disease and an STD.  She was demanding Lyme disease blood work and STD testing.  I tried to explain to her that we did have a definitive explanation for her current symptoms,  but patient continued to demand testing so these were performed.  Will initiate antibiotic coverage appropriate for diverticulitis, return to the ER if she has any worsening symptoms.  Final Clinical Impressions(s) / ED Diagnoses   Final diagnoses:  Diverticulitis    ED Discharge Orders        Ordered    ciprofloxacin (CIPRO) 500 MG tablet  2 times daily     11/14/17 0346    metroNIDAZOLE (FLAGYL) 500 MG tablet  3 times daily  11/14/17 0346    ibuprofen (ADVIL,MOTRIN) 800 MG tablet  Every 8 hours PRN     11/14/17 0346       Gilda Crease, MD 11/14/17 [phone]

## 2017-11-15 LAB — URINE CULTURE

## 2017-11-15 LAB — B. BURGDORFI ANTIBODIES: B burgdorferi Ab IgG+IgM: <0.91 {ISR} (ref 0.00–0.90)

## 2018-03-16 ENCOUNTER — Emergency Department (HOSPITAL_COMMUNITY)
Admission: EM | Admit: 2018-03-16 | Discharge: 2018-03-16 | Disposition: A | Discharge status: Home / Self Care | Payer: 59 | Attending: Emergency Medicine | Admitting: Emergency Medicine

## 2018-03-16 ENCOUNTER — Encounter (HOSPITAL_COMMUNITY): Payer: Self-pay | Admitting: Emergency Medicine

## 2018-03-16 DIAGNOSIS — F17210 Nicotine dependence, cigarettes, uncomplicated: Secondary | ICD-10-CM | POA: Insufficient documentation

## 2018-03-16 DIAGNOSIS — I10 Essential (primary) hypertension: Secondary | ICD-10-CM | POA: Diagnosis not present

## 2018-03-16 DIAGNOSIS — T161XXA Foreign body in right ear, initial encounter: Secondary | ICD-10-CM | POA: Diagnosis present

## 2018-03-16 DIAGNOSIS — Y929 Unspecified place or not applicable: Secondary | ICD-10-CM | POA: Insufficient documentation

## 2018-03-16 DIAGNOSIS — Y999 Unspecified external cause status: Secondary | ICD-10-CM | POA: Insufficient documentation

## 2018-03-16 DIAGNOSIS — W228XXA Striking against or struck by other objects, initial encounter: Secondary | ICD-10-CM | POA: Insufficient documentation

## 2018-03-16 DIAGNOSIS — Z79899 Other long term (current) drug therapy: Secondary | ICD-10-CM | POA: Insufficient documentation

## 2018-03-16 DIAGNOSIS — Y939 Activity, unspecified: Secondary | ICD-10-CM | POA: Insufficient documentation

## 2018-03-16 HISTORY — DX: Diverticulitis of intestine, part unspecified, without perforation or abscess without bleeding: K57.92

## 2018-03-16 MED ORDER — CIPROFLOXACIN-DEXAMETHASONE 0.3-0.1 % OT SUSP: 4.0000 [drp] | Freq: Two times a day (BID) | OTIC | 0 refills | Status: DC

## 2018-03-16 MED ORDER — IBUPROFEN 800 MG PO TABS: 800.0000 mg | ORAL_TABLET | Freq: Once | ORAL | Status: DC

## 2018-03-16 MED ORDER — ACETAMINOPHEN 500 MG PO TABS: 1000.0000 mg | ORAL_TABLET | Freq: Once | ORAL | Status: DC

## 2018-03-16 NOTE — ED Notes (Signed)
Patient verbalizes understanding of discharge instructions. Opportunity for questioning and answers were provided. Armband removed by staff, pt discharged from ED ambulatory w/ self

## 2018-03-16 NOTE — ED Triage Notes (Signed)
Patient reports worsening right ear ache onset last week with drainage , denies injury /no hearing loss.

## 2018-03-16 NOTE — ED Provider Notes (Signed)
[facility] EMERGENCY DEPARTMENT Provider Note   CSN: [id number] Arrival date & time: 03/16/18  0123     History   Chief Complaint Chief Complaint  Patient presents with  . Otalgia    HPI Margaret Christian is a 52 y.o. female.  The history is provided by the patient.  Otalgia  This is a new problem. The current episode started more than 2 days ago. There is pain in the right ear. The problem occurs constantly. The problem has not changed since onset.There has been no fever. The pain is at a severity of 8/10. The pain is severe. Associated symptoms include ear discharge. Pertinent negatives include no hearing loss, no rhinorrhea, no sore throat, no abdominal pain, no diarrhea, no vomiting, no neck pain, no cough and no rash. Her past medical history does not include chronic ear infection.  Has been using Qtips.    Past Medical History:  Diagnosis Date  . Diverticulitis   . Hypertension   . Paranoid schizophrenia (HCC)   . Phlebitis     Patient Active Problem List   Diagnosis Date Noted  . Schizoaffective disorder, bipolar type (HCC) 07/23/2016  . Paranoid schizophrenia (HCC)   . Schizophrenia (HCC) 06/20/2015  . URI (upper respiratory infection) 01/30/2013  . Prediabetes 01/30/2013  . Preventative health care 11/23/2012  . Numbness and tingling in hands 11/23/2012  . Elevated blood pressure reading without diagnosis of hypertension 10/31/2011  . Morbid obesity (HCC) 10/31/2011    Past Surgical History:  Procedure Laterality Date  . CESAREAN SECTION  2001     OB History    Gravida  0   Para  0   Term  0   Preterm  0   AB  0   Living        SAB  0   TAB  0   Ectopic  0   Multiple      Live Births               Home Medications    Prior to Admission medications   Medication Sig Start Date End Date Taking? Authorizing Provider  amLODipine (NORVASC) 5 MG tablet Take 1 tablet (5 mg total) by mouth daily. Patient not  taking: Reported on 11/14/2017 07/29/16   Adonis Brook, NP  ARIPiprazole (ABILIFY) 10 MG tablet Take 1 tablet (10 mg total) by mouth daily. Patient not taking: Reported on 11/14/2017 07/29/16   Adonis Brook, NP  carbamazepine (TEGRETOL) 100 MG chewable tablet Chew 1 tablet (100 mg total) by mouth 2 (two) times daily after a meal. Patient not taking: Reported on 11/14/2017 07/28/16   Adonis Brook, NP  ciprofloxacin (CIPRO) 500 MG tablet Take 1 tablet (500 mg total) by mouth 2 (two) times daily. 11/14/17   Gilda Crease, MD  ibuprofen (ADVIL,MOTRIN) 800 MG tablet Take 1 tablet (800 mg total) by mouth every 8 (eight) hours as needed. 11/14/17   Gilda Crease, MD  metroNIDAZOLE (FLAGYL) 500 MG tablet Take 1 tablet (500 mg total) by mouth 3 (three) times daily. 11/14/17   Gilda Crease, MD  pantoprazole (PROTONIX) 40 MG tablet Take 1 tablet (40 mg total) by mouth 2 (two) times daily before a meal. Patient not taking: Reported on 11/14/2017 07/28/16   Adonis Brook, NP    Family History Family History  Problem Relation Age of Onset  . Heart failure Father   . Hypertension Father   . Hypertension Brother   . Diabetes type  II Paternal Grandfather   . Aortic aneurysm Maternal Grandfather   . Coronary artery disease Maternal Grandfather   . Stroke Maternal Grandfather     Social History Social History   Tobacco Use  . Smoking status: Light Tobacco Smoker    Types: Cigarettes  . Smokeless tobacco: Never Used  Substance Use Topics  . Alcohol use: Yes    Alcohol/week: 0.0 standard drinks  . Drug use: No     Allergies   Shellfish allergy   Review of Systems Review of Systems  Constitutional: Negative for diaphoresis and fever.  HENT: Positive for ear discharge and ear pain. Negative for hearing loss, rhinorrhea and sore throat.   Respiratory: Negative for cough.   Cardiovascular: Negative for chest pain.  Gastrointestinal: Negative for abdominal pain,  diarrhea and vomiting.  Musculoskeletal: Negative for neck pain.  Skin: Negative for rash.  All other systems reviewed and are negative.    Physical Exam Updated Vital Signs BP (!) 185/109 (BP Location: Right Arm)   Pulse 93   Temp 98.7 F (37.1 C) (Oral)   Resp 16   SpO2 99%   Physical Exam  Constitutional: She is oriented to person, place, and time. She appears well-developed and well-nourished. No distress.  HENT:  Head: Normocephalic and atraumatic.  Right Ear: No hemotympanum.  Left Ear: No hemotympanum.  Nose: Nose normal.  qtip in the right canal     Eyes: Pupils are equal, round, and reactive to light. Conjunctivae are normal.  Neck: Normal range of motion. Neck supple.  Cardiovascular: Normal rate, regular rhythm, normal heart sounds and intact distal pulses.  Pulmonary/Chest: Effort normal and breath sounds normal. No stridor. She has no wheezes. She has no rales.  Abdominal: Soft. Bowel sounds are normal. She exhibits no mass. There is no tenderness. There is no rebound and no guarding.  Musculoskeletal: Normal range of motion.  Neurological: She is alert and oriented to person, place, and time. She displays normal reflexes.  Skin: Skin is warm and dry. Capillary refill takes less than 2 seconds.  Psychiatric: She has a normal mood and affect.     ED Treatments / Results  Labs (all labs ordered are listed, but only abnormal results are displayed) Labs Reviewed - No data to display  EKG None  Radiology No results found.  Procedures Procedures (including critical care time)  Medications Ordered in ED Patient refused tylenol and ibuprofen   Patient unable to tolerate irrigation and extraction with alligator forceps.  Retained cotton material up against the tympanic membrane  Final Clinical Impressions(s) / ED Diagnoses    Will start ear drops and refer to ENT for extraction of likely QTIP as patient is unable to tolerate this in the ED.    Return  for weakness, numbness, changes in vision or speech, fevers >100.4 unrelieved by medication, shortness of breath, intractable vomiting, or diarrhea, abdominal pain, Inability to tolerate liquids or food, cough, altered mental status or any concerns. No signs of systemic illness or infection. The patient is nontoxic-appearing on exam and vital signs are within normal limits.    I have reviewed the triage vital signs and the nursing notes. Pertinent labs &imaging results that were available during my care of the patient were reviewed by me and considered in my medical decision making (see chart for details).  After history, exam, and medical workup I feel the patient has been appropriately medically screened and is safe for discharge home. Pertinent diagnoses were discussed with the  patient. Patient was given return precautions.      Avanish Cerullo, MD 03/16/18 [phone]

## 2019-12-06 ENCOUNTER — Telehealth: Payer: Self-pay | Admitting: General Practice

## 2019-12-06 NOTE — Telephone Encounter (Signed)
Patient has been rescheduled.

## 2019-12-06 NOTE — Telephone Encounter (Signed)
LMVM for the patient to contact the office to schedule a sooner new patient appointment on July 15th at 8:30 or 9:30 with Ardyth Harps from the wait list

## 2019-12-13 ENCOUNTER — Encounter: Payer: Self-pay | Admitting: Internal Medicine

## 2019-12-13 ENCOUNTER — Other Ambulatory Visit: Payer: Self-pay

## 2019-12-13 ENCOUNTER — Ambulatory Visit (INDEPENDENT_AMBULATORY_CARE_PROVIDER_SITE_OTHER): Payer: 59 | Admitting: Internal Medicine

## 2019-12-13 VITALS — BP 160/100 | HR 95 | Temp 97.7°F | Ht 64.0 in | Wt 238.7 lb

## 2019-12-13 DIAGNOSIS — I10 Essential (primary) hypertension: Secondary | ICD-10-CM | POA: Diagnosis not present

## 2019-12-13 DIAGNOSIS — S99829A Other specified injuries of unspecified foot, initial encounter: Secondary | ICD-10-CM | POA: Diagnosis not present

## 2019-12-13 DIAGNOSIS — Z1231 Encounter for screening mammogram for malignant neoplasm of breast: Secondary | ICD-10-CM | POA: Diagnosis not present

## 2019-12-13 MED ORDER — LISINOPRIL 10 MG PO TABS: 10.0000 mg | ORAL_TABLET | Freq: Every day | ORAL | 1 refills | Status: DC

## 2019-12-13 NOTE — Patient Instructions (Signed)
-Nice seeing you today!!  -Start lisinopril 10 mg daily.  -Low salt diet (see below).  -Schedule follow up in 6 weeks. Please come in fasting.   DASH Eating Plan DASH stands for "Dietary Approaches to Stop Hypertension." The DASH eating plan is a healthy eating plan that has been shown to reduce high blood pressure (hypertension). It may also reduce your risk for type 2 diabetes, heart disease, and stroke. The DASH eating plan may also help with weight loss. What are tips for following this plan?  General guidelines  Avoid eating more than 2,300 mg (milligrams) of salt (sodium) a day. If you have hypertension, you may need to reduce your sodium intake to 1,500 mg a day.  Limit alcohol intake to no more than 1 drink a day for nonpregnant women and 2 drinks a day for men. One drink equals 12 oz of beer, 5 oz of wine, or 1 oz of hard liquor.  Work with your health care provider to maintain a healthy body weight or to lose weight. Ask what an ideal weight is for you.  Get at least 30 minutes of exercise that causes your heart to beat faster (aerobic exercise) most days of the week. Activities may include walking, swimming, or biking.  Work with your health care provider or diet and nutrition specialist (dietitian) to adjust your eating plan to your individual calorie needs. Reading food labels   Check food labels for the amount of sodium per serving. Choose foods with less than 5 percent of the Daily Value of sodium. Generally, foods with less than 300 mg of sodium per serving fit into this eating plan.  To find whole grains, look for the word "whole" as the first word in the ingredient list. Shopping  Buy products labeled as "low-sodium" or "no salt added."  Buy fresh foods. Avoid canned foods and premade or frozen meals. Cooking  Avoid adding salt when cooking. Use salt-free seasonings or herbs instead of table salt or sea salt. Check with your health care provider or pharmacist  before using salt substitutes.  Do not fry foods. Cook foods using healthy methods such as baking, boiling, grilling, and broiling instead.  Cook with heart-healthy oils, such as olive, canola, soybean, or sunflower oil. Meal planning  Eat a balanced diet that includes: ? 5 or more servings of fruits and vegetables each day. At each meal, try to fill half of your plate with fruits and vegetables. ? Up to 6-8 servings of whole grains each day. ? Less than 6 oz of lean meat, poultry, or fish each day. A 3-oz serving of meat is about the same size as a deck of cards. One egg equals 1 oz. ? 2 servings of low-fat dairy each day. ? A serving of nuts, seeds, or beans 5 times each week. ? Heart-healthy fats. Healthy fats called Omega-3 fatty acids are found in foods such as flaxseeds and coldwater fish, like sardines, salmon, and mackerel.  Limit how much you eat of the following: ? Canned or prepackaged foods. ? Food that is high in trans fat, such as fried foods. ? Food that is high in saturated fat, such as fatty meat. ? Sweets, desserts, sugary drinks, and other foods with added sugar. ? Full-fat dairy products.  Do not salt foods before eating.  Try to eat at least 2 vegetarian meals each week.  Eat more home-cooked food and less restaurant, buffet, and fast food.  When eating at a restaurant, ask that your  food be prepared with less salt or no salt, if possible. What foods are recommended? The items listed may not be a complete list. Talk with your dietitian about what dietary choices are best for you. Grains Whole-grain or whole-wheat bread. Whole-grain or whole-wheat pasta. Brown rice. [place] Morrow. Bulgur. Whole-grain and low-sodium cereals. Pita bread. Low-fat, low-sodium crackers. Whole-wheat flour tortillas. Vegetables Fresh or frozen vegetables (raw, steamed, roasted, or grilled). Low-sodium or reduced-sodium tomato and vegetable juice. Low-sodium or reduced-sodium tomato  sauce and tomato paste. Low-sodium or reduced-sodium canned vegetables. Fruits All fresh, dried, or frozen fruit. Canned fruit in natural juice (without added sugar). Meat and other protein foods Skinless chicken or [place]. Ground chicken or [place]. Pork with fat trimmed off. Fish and seafood. Egg whites. Dried beans, peas, or lentils. Unsalted nuts, nut butters, and seeds. Unsalted canned beans. Lean cuts of beef with fat trimmed off. Low-sodium, lean deli meat. Dairy Low-fat (1%) or fat-free (skim) milk. Fat-free, low-fat, or reduced-fat cheeses. Nonfat, low-sodium ricotta or cottage cheese. Low-fat or nonfat yogurt. Low-fat, low-sodium cheese. Fats and oils Soft margarine without trans fats. Vegetable oil. Low-fat, reduced-fat, or light mayonnaise and salad dressings (reduced-sodium). Canola, safflower, olive, soybean, and sunflower oils. Avocado. Seasoning and other foods Herbs. Spices. Seasoning mixes without salt. Unsalted popcorn and pretzels. Fat-free sweets. What foods are not recommended? The items listed may not be a complete list. Talk with your dietitian about what dietary choices are best for you. Grains Baked goods made with fat, such as croissants, muffins, or some breads. Dry pasta or rice meal packs. Vegetables Creamed or fried vegetables. Vegetables in a cheese sauce. Regular canned vegetables (not low-sodium or reduced-sodium). Regular canned tomato sauce and paste (not low-sodium or reduced-sodium). Regular tomato and vegetable juice (not low-sodium or reduced-sodium). Angie Fava. Olives. Fruits Canned fruit in a light or heavy syrup. Fried fruit. Fruit in cream or butter sauce. Meat and other protein foods Fatty cuts of meat. Ribs. Fried meat. Berniece Salines. Sausage. Bologna and other processed lunch meats. Salami. Fatback. Hotdogs. Bratwurst. Salted nuts and seeds. Canned beans with added salt. Canned or smoked fish. Whole eggs or egg yolks. Chicken or [place] with skin. Dairy Whole  or 2% milk, cream, and half-and-half. Whole or full-fat cream cheese. Whole-fat or sweetened yogurt. Full-fat cheese. Nondairy creamers. Whipped toppings. Processed cheese and cheese spreads. Fats and oils Butter. Stick margarine. Lard. Shortening. Ghee. Bacon fat. Tropical oils, such as coconut, palm kernel, or palm oil. Seasoning and other foods Salted popcorn and pretzels. Onion salt, garlic salt, seasoned salt, table salt, and sea salt. Worcestershire sauce. Tartar sauce. Barbecue sauce. Teriyaki sauce. Soy sauce, including reduced-sodium. Steak sauce. Canned and packaged gravies. Fish sauce. Oyster sauce. Cocktail sauce. Horseradish that you find on the shelf. Ketchup. Mustard. Meat flavorings and tenderizers. Bouillon cubes. Hot sauce and Tabasco sauce. Premade or packaged marinades. Premade or packaged taco seasonings. Relishes. Regular salad dressings. Where to find more information:  National Heart, Lung, and [place]: https://wilson-eaton.com/  American Heart Association: www.heart.org Summary  The DASH eating plan is a healthy eating plan that has been shown to reduce high blood pressure (hypertension). It may also reduce your risk for type 2 diabetes, heart disease, and stroke.  With the DASH eating plan, you should limit salt (sodium) intake to 2,300 mg a day. If you have hypertension, you may need to reduce your sodium intake to 1,500 mg a day.  When on the DASH eating plan, aim to eat more fresh fruits and vegetables,  whole grains, lean proteins, low-fat dairy, and heart-healthy fats.  Work with your health care provider or diet and nutrition specialist (dietitian) to adjust your eating plan to your individual calorie needs. This information is not intended to replace advice given to you by your health care provider. Make sure you discuss any questions you have with your health care provider. Document Revised: 04/29/2017 Document Reviewed: 05/10/2016 Elsevier Patient Education   2020 [employer].

## 2019-12-13 NOTE — Addendum Note (Signed)
Addended by: Kern Reap B on: 12/13/2019 04:24 PM   Modules accepted: Orders

## 2019-12-13 NOTE — Progress Notes (Signed)
New Patient Office Visit     This visit occurred during the SARS-CoV-2 public health emergency.  Safety protocols were in place, including screening questions prior to the visit, additional usage of staff PPE, and extensive cleaning of exam room while observing appropriate contact time as indicated for disinfecting solutions.    CC/Reason for Visit: Establish care, discuss chronic conditions Previous PCP: Unknown Last Visit: Unknown  HPI: Margaret Christian is a 54 y.o. female who is coming in today for the above mentioned reasons. Past Medical History is significant for: Morbid obesity with a BMI greater than 40, history of elevated blood pressure over the years not formally diagnosed as hypertensive, history of diverticulitis a few months ago treated with antibiotic therapy.  She suffered a fall in January and has been having some lower back pain with left external thigh pain no radiation, she states she had x-rays done at [facility] and was told she had "an old fracture" but no treatments or referrals were advised.  I have asked her to obtain these records for me.  She carries a history in her chart of paranoid schizophrenia/schizoaffective disorder.  She does not believe this is correct, she has not seen a psychiatrist in years.  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  Her family history significant for father with coronary artery disease in the mother status post a thyroidectomy presumably due to Graves' disease as she was told it was not cancer.  She is overdue for all cancer screenings, she has agreed to mammogram and Cologuard.  She has had her Covid vaccines.  She is due for Tdap, shingles and flu vaccines.   Past Medical/Surgical History: Past Medical History:  Diagnosis Date  . Diverticulitis   . Hypertension   . Hypertension   . Morbid  obesity (HCC)   . Paranoid schizophrenia (HCC)   . Phlebitis     Past Surgical History:  Procedure Laterality Date  . CESAREAN SECTION  2001    Social History:  reports that she has quit smoking. Her smoking use included cigarettes. She has never used smokeless tobacco. She reports previous alcohol use. She reports that she does not use drugs.  Allergies: Allergies  Allergen Reactions  . Other Nausea And Vomiting and Hives  . Shellfish Allergy Hives and Nausea And Vomiting  . Aminobenzoate Rash    Family History:  Family History  Problem Relation Age of Onset  . Heart failure Father   . Hypertension Father   . Hypertension Brother   . Diabetes type II Paternal Grandfather   . Aortic aneurysm Maternal Grandfather   . Coronary artery disease Maternal Grandfather   . Stroke Maternal Grandfather      Current Outpatient Medications:  .  Flax OIL, Take by mouth., Disp: , Rfl:  .  Ginger, Zingiber officinalis, (GINGER PO), Take by mouth., Disp: , Rfl:  .  ibuprofen (ADVIL,MOTRIN) 800 MG tablet, Take 1 tablet (800 mg total) by mouth every 8 (eight) hours as needed., Disp: 21 tablet, Rfl: 0 .  metroNIDAZOLE (FLAGYL) 500 MG tablet, Take 1 tablet (500 mg total) by mouth 3 (three) times daily., Disp: 30 tablet, Rfl: 0 .  Multiple Vitamin (MULTIVITAMIN) tablet, Take 1 tablet by mouth daily., Disp: , Rfl:  .  NON FORMULARY, , Disp: , Rfl:  .  NON FORMULARY, Vine  lozenges, Disp: , Rfl:  .  Omega-3 Fatty Acids (FISH OIL) 500 MG CAPS, Take by mouth., Disp: , Rfl:  .  pantoprazole (PROTONIX) 40 MG tablet, Take 1 tablet (40 mg total) by mouth 2 (two) times daily before a meal., Disp: 60 tablet, Rfl: 0 .  vitamin B-12 (CYANOCOBALAMIN) 500 MCG tablet, Take 500 mcg by mouth daily., Disp: , Rfl:  .  lisinopril (ZESTRIL) 10 MG tablet, Take 1 tablet (10 mg total) by mouth daily., Disp: 90 tablet, Rfl: 1  Review of Systems:  Constitutional: Denies fever, chills, diaphoresis, appetite change and  fatigue.  HEENT: Denies photophobia, eye pain, redness, hearing loss, ear pain, congestion, sore throat, rhinorrhea, sneezing, mouth sores, trouble swallowing, neck pain, neck stiffness and tinnitus.   Respiratory: Denies SOB, DOE, cough, chest tightness,  and wheezing.   Cardiovascular: Denies chest pain, palpitations and leg swelling.  Gastrointestinal: Denies nausea, vomiting, abdominal pain, diarrhea, constipation, blood in stool and abdominal distention.  Genitourinary: Denies dysuria, urgency, frequency, hematuria, flank pain and difficulty urinating.  Endocrine: Denies: hot or cold intolerance, sweats, changes in hair or nails, polyuria, polydipsia. Musculoskeletal: Denies myalgias, back pain, joint swelling, arthralgias and gait problem.  Skin: Denies pallor, rash and wound.  Neurological: Denies dizziness, seizures, syncope, weakness, light-headedness, numbness and headaches.  Hematological: Denies adenopathy. Easy bruising, personal or family bleeding history  Psychiatric/Behavioral: Denies suicidal ideation, mood changes, confusion, nervousness, sleep disturbance and agitation    Physical Exam: Vitals:   12/13/19 0927  BP: (!) 160/100  Pulse: 95  Temp: 97.7 F (36.5 C)  TempSrc: Temporal  SpO2: 98%  Weight: 238 lb 11.2 oz (108.3 kg)  Height: 5\' 4"  (1.626 m)   Body mass index is 40.97 kg/m.  Constitutional: NAD, calm, comfortable Eyes: PERRL, lids and conjunctivae normal ENMT: Mucous membranes are moist.  Respiratory: clear to auscultation bilaterally, no wheezing, no crackles. Normal respiratory effort. No accessory muscle use.  Cardiovascular: Regular rate and rhythm, no murmurs / rubs / gallops. No extremity edema.  Neurologic: Grossly intact and nonfocal  Psychiatric: Normal judgment and insight. Alert and oriented x 3. Normal mood.    Impression and Plan:  Encounter for screening mammogram for malignant neoplasm of breast  - Plan: MM Digital  Screening  Essential hypertension  -BP elevated today, will go ahead and make this diagnosis. -Start lisinopril 10 mg daily and return in 6 weeks for follow up.  Morbid obesity (HCC) -Discussed healthy lifestyle, including increased physical activity and better food choices to promote weight loss.  Cologuard ordered today as well for colon cancer screening.    Patient Instructions  -Nice seeing you today!!  -Start lisinopril 10 mg daily.  -Low salt diet (see below).  -Schedule follow up in 6 weeks. Please come in fasting.   DASH Eating Plan DASH stands for "Dietary Approaches to Stop Hypertension." The DASH eating plan is a healthy eating plan that has been shown to reduce high blood pressure (hypertension). It may also reduce your risk for type 2 diabetes, heart disease, and stroke. The DASH eating plan may also help with weight loss. What are tips for following this plan?  General guidelines  Avoid eating more than 2,300 mg (milligrams) of salt (sodium) a day. If you have hypertension, you may need to reduce your sodium intake to 1,500 mg a day.  Limit alcohol intake to no more than 1 drink a day for nonpregnant women and 2 drinks a day for men. One drink equals 12 oz of  beer, 5 oz of wine, or 1 oz of hard liquor.  Work with your health care provider to maintain a healthy body weight or to lose weight. Ask what an ideal weight is for you.  Get at least 30 minutes of exercise that causes your heart to beat faster (aerobic exercise) most days of the week. Activities may include walking, swimming, or biking.  Work with your health care provider or diet and nutrition specialist (dietitian) to adjust your eating plan to your individual calorie needs. Reading food labels   Check food labels for the amount of sodium per serving. Choose foods with less than 5 percent of the Daily Value of sodium. Generally, foods with less than 300 mg of sodium per serving fit into this eating  plan.  To find whole grains, look for the word "whole" as the first word in the ingredient list. Shopping  Buy products labeled as "low-sodium" or "no salt added."  Buy fresh foods. Avoid canned foods and premade or frozen meals. Cooking  Avoid adding salt when cooking. Use salt-free seasonings or herbs instead of table salt or sea salt. Check with your health care provider or pharmacist before using salt substitutes.  Do not fry foods. Cook foods using healthy methods such as baking, boiling, grilling, and broiling instead.  Cook with heart-healthy oils, such as olive, canola, soybean, or sunflower oil. Meal planning  Eat a balanced diet that includes: ? 5 or more servings of fruits and vegetables each day. At each meal, try to fill half of your plate with fruits and vegetables. ? Up to 6-8 servings of whole grains each day. ? Less than 6 oz of lean meat, poultry, or fish each day. A 3-oz serving of meat is about the same size as a deck of cards. One egg equals 1 oz. ? 2 servings of low-fat dairy each day. ? A serving of nuts, seeds, or beans 5 times each week. ? Heart-healthy fats. Healthy fats called Omega-3 fatty acids are found in foods such as flaxseeds and coldwater fish, like sardines, salmon, and mackerel.  Limit how much you eat of the following: ? Canned or prepackaged foods. ? Food that is high in trans fat, such as fried foods. ? Food that is high in saturated fat, such as fatty meat. ? Sweets, desserts, sugary drinks, and other foods with added sugar. ? Full-fat dairy products.  Do not salt foods before eating.  Try to eat at least 2 vegetarian meals each week.  Eat more home-cooked food and less restaurant, buffet, and fast food.  When eating at a restaurant, ask that your food be prepared with less salt or no salt, if possible. What foods are recommended? The items listed may not be a complete list. Talk with your dietitian about what dietary choices are best  for you. Grains Whole-grain or whole-wheat bread. Whole-grain or whole-wheat pasta. Brown rice. Orpah Cobb. Bulgur. Whole-grain and low-sodium cereals. Pita bread. Low-fat, low-sodium crackers. Whole-wheat flour tortillas. Vegetables Fresh or frozen vegetables (raw, steamed, roasted, or grilled). Low-sodium or reduced-sodium tomato and vegetable juice. Low-sodium or reduced-sodium tomato sauce and tomato paste. Low-sodium or reduced-sodium canned vegetables. Fruits All fresh, dried, or frozen fruit. Canned fruit in natural juice (without added sugar). Meat and other protein foods Skinless chicken or [place]. Ground chicken or [place]. Pork with fat trimmed off. Fish and seafood. Egg whites. Dried beans, peas, or lentils. Unsalted nuts, nut butters, and seeds. Unsalted canned beans. Lean cuts of beef with fat  trimmed off. Low-sodium, lean deli meat. Dairy Low-fat (1%) or fat-free (skim) milk. Fat-free, low-fat, or reduced-fat cheeses. Nonfat, low-sodium ricotta or cottage cheese. Low-fat or nonfat yogurt. Low-fat, low-sodium cheese. Fats and oils Soft margarine without trans fats. Vegetable oil. Low-fat, reduced-fat, or light mayonnaise and salad dressings (reduced-sodium). Canola, safflower, olive, soybean, and sunflower oils. Avocado. Seasoning and other foods Herbs. Spices. Seasoning mixes without salt. Unsalted popcorn and pretzels. Fat-free sweets. What foods are not recommended? The items listed may not be a complete list. Talk with your dietitian about what dietary choices are best for you. Grains Baked goods made with fat, such as croissants, muffins, or some breads. Dry pasta or rice meal packs. Vegetables Creamed or fried vegetables. Vegetables in a cheese sauce. Regular canned vegetables (not low-sodium or reduced-sodium). Regular canned tomato sauce and paste (not low-sodium or reduced-sodium). Regular tomato and vegetable juice (not low-sodium or reduced-sodium). Rosita FirePickles.  Olives. Fruits Canned fruit in a light or heavy syrup. Fried fruit. Fruit in cream or butter sauce. Meat and other protein foods Fatty cuts of meat. Ribs. Fried meat. Tomasa BlaseBacon. Sausage. Bologna and other processed lunch meats. Salami. Fatback. Hotdogs. Bratwurst. Salted nuts and seeds. Canned beans with added salt. Canned or smoked fish. Whole eggs or egg yolks. Chicken or [place]urkey with skin. Dairy Whole or 2% milk, cream, and half-and-half. Whole or full-fat cream cheese. Whole-fat or sweetened yogurt. Full-fat cheese. Nondairy creamers. Whipped toppings. Processed cheese and cheese spreads. Fats and oils Butter. Stick margarine. Lard. Shortening. Ghee. Bacon fat. Tropical oils, such as coconut, palm kernel, or palm oil. Seasoning and other foods Salted popcorn and pretzels. Onion salt, garlic salt, seasoned salt, table salt, and sea salt. Worcestershire sauce. Tartar sauce. Barbecue sauce. Teriyaki sauce. Soy sauce, including reduced-sodium. Steak sauce. Canned and packaged gravies. Fish sauce. Oyster sauce. Cocktail sauce. Horseradish that you find on the shelf. Ketchup. Mustard. Meat flavorings and tenderizers. Bouillon cubes. Hot sauce and Tabasco sauce. Premade or packaged marinades. Premade or packaged taco seasonings. Relishes. Regular salad dressings. Where to find more information:  National Heart, Lung, and Blood Institute: PopSteam.iswww.nhlbi.nih.gov  American Heart Association: www.heart.org Summary  The DASH eating plan is a healthy eating plan that has been shown to reduce high blood pressure (hypertension). It may also reduce your risk for type 2 diabetes, heart disease, and stroke.  With the DASH eating plan, you should limit salt (sodium) intake to 2,300 mg a day. If you have hypertension, you may need to reduce your sodium intake to 1,500 mg a day.  When on the DASH eating plan, aim to eat more fresh fruits and vegetables, whole grains, lean proteins, low-fat dairy, and heart-healthy  fats.  Work with your health care provider or diet and nutrition specialist (dietitian) to adjust your eating plan to your individual calorie needs. This information is not intended to replace advice given to you by your health care provider. Make sure you discuss any questions you have with your health care provider. Document Revised: 04/29/2017 Document Reviewed: 05/10/2016 Elsevier Patient Education  2020 Elsevier Inc.      Chaya JanEstela Hernandez Acosta, MD [facility]

## 2020-01-03 ENCOUNTER — Ambulatory Visit: Payer: 59 | Admitting: Internal Medicine

## 2020-01-04 ENCOUNTER — Other Ambulatory Visit: Payer: Self-pay

## 2020-01-04 ENCOUNTER — Telehealth: Payer: Self-pay | Admitting: Internal Medicine

## 2020-01-04 DIAGNOSIS — M79605 Pain in left leg: Secondary | ICD-10-CM

## 2020-01-04 NOTE — Telephone Encounter (Signed)
Called patient and LMOVM to return call  Left a detailed voice message to let patient know that the referral to Orthopaedics has been placed.

## 2020-01-04 NOTE — Telephone Encounter (Signed)
Last OV 12/13/2019  I see a brief summary of a fall in January from LOV notes. Not sure if this was discussed or if she needs another appointment?  Please advise.

## 2020-01-04 NOTE — Telephone Encounter (Signed)
The patient called to request a referral for Orthopedics. She needs a MRI for her left leg. She stated that her left leg is screaming. She has never been in so much pain.  Please advise

## 2020-01-04 NOTE — Telephone Encounter (Signed)
Ok to refer to ortho 

## 2020-01-08 ENCOUNTER — Encounter: Payer: Self-pay | Admitting: Orthopaedic Surgery

## 2020-01-08 ENCOUNTER — Ambulatory Visit (INDEPENDENT_AMBULATORY_CARE_PROVIDER_SITE_OTHER): Payer: 59 | Admitting: Orthopaedic Surgery

## 2020-01-08 ENCOUNTER — Ambulatory Visit: Payer: Self-pay

## 2020-01-08 VITALS — Ht 64.5 in | Wt 237.0 lb

## 2020-01-08 DIAGNOSIS — M79661 Pain in right lower leg: Secondary | ICD-10-CM | POA: Diagnosis not present

## 2020-01-08 DIAGNOSIS — M79605 Pain in left leg: Secondary | ICD-10-CM | POA: Diagnosis not present

## 2020-01-08 NOTE — Progress Notes (Signed)
Office Visit Note   Patient: Margaret Christian           Date of Birth: 01/17/1966           MRN: [id number] Visit Date: 01/08/2020              Requested by: Philip Aspen, Limmie Patricia, MD [address] [place],  [place] [address] PCP: Philip Aspen, Limmie Patricia, MD   Assessment & Plan: Visit Diagnoses:  1. Pain in left leg   2. Pain in right lower leg     Plan: Left leg pain after a fall approximately 2 months ago.  Discomfort is difficult to explain but it seems to be localized to the left lower extremity and more specifically about the lateral proximal tibia.  There also is some occasional buttock and lateral left hip pain.  Exam is nonspecific.  Based on history and pain will obtain an MRI scan to include proximal tib-fib and knee joint.  If that scan is nondiagnostic consider MRI of lumbar spine as this may be related to stenosis or nerve root irritation  Follow-Up Instructions: Return After MRI scan left lower extremity.   Orders:  Orders Placed This Encounter  Procedures  . XR Lumbar Spine 2-3 Views  . XR Pelvis 1-2 Views  . XR Tibia/Fibula Left   No orders of the defined types were placed in this encounter.     Procedures: No procedures performed   Clinical Data: No additional findings.   Subjective: Chief Complaint  Patient presents with  . Left Leg - Pain  Patient presents today for left leg pain. She states that she was wearing heels and rolled her ankle two months ago. She has been having pain in her left buttock, left groin, and left lower leg. She said that her pain in her lower leg is lateral and radiates down in the medial ankle. She is unable to sleep on her left side at night. She is taking advil and tylenol for pain. No other lower back pain. She said that both her feet are numb at times. She said that she initially had some bruising in her lower leg, but not anymore.  Patient is concerned that she may have a nasal nerve fracture of her proximal  fibula.  Her pain is not consistent but notes it is definitely better when she is off her feet.  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  She has had some left lateral buttock pain lateral hip pain and discomfort as far distally as her left lateral leg.  She does have some pre-existing neuropathy that she experiences in both of her feet.  She is not diabetic.  She seems to have the problem with her leg pain the longer she is on her feet of the further she walks.  Not really having any knee pain or groin discomfort.  No bowel or bladder changes.  No related weakness of her left foot  HPI  Review of Systems  Constitutional: Negative for fatigue.  HENT: Negative for ear pain.   Eyes: Negative for pain.  Respiratory: Negative for shortness of breath.   Cardiovascular: Positive for leg swelling.  Gastrointestinal: Positive for diarrhea. Negative for constipation.  Endocrine: Negative for cold intolerance and heat intolerance.  Genitourinary: Negative for difficulty urinating.  Musculoskeletal: Negative for joint swelling.  Skin: Positive for rash.  Allergic/Immunologic: Positive for food allergies.  Neurological: Negative for  weakness.  Hematological: Does not bruise/bleed easily.  Psychiatric/Behavioral: Positive for sleep disturbance.     Objective: Vital Signs: Ht 5' 4.5" (1.638 m)   Wt 237 lb (107.5 kg)   BMI 40.05 kg/m   Physical Exam Constitutional:      Appearance: She is well-developed.  Eyes:     Pupils: Pupils are equal, round, and reactive to light.  Pulmonary:     Effort: Pulmonary effort is normal.  Skin:    General: Skin is warm and dry.  Neurological:     Mental Status: She is alert and oriented to person, place, and time.  Psychiatric:        Behavior: Behavior normal.     Ortho Exam The leg raise negative bilaterally.  Painless range of motion of both hips.  No localized areas of tenderness about  the left buttock or the lateral left hip.  No thigh pain.  Some mild lateral joint pain left knee.  No effusion.  Full extension flexed about 100 degrees without instability.  Does have some tenderness along the proximal leg laterally but not a specific Tinel over the peroneal nerve.  Has good strength in her left foot in dorsiflexion plantarflexion inversion and eversion.  Has good capillary refill.  No swelling.  No specific ankle pain. Specialty Comments:  No specialty comments available.  Imaging: XR Tibia/Fibula Left  Result Date: 01/08/2020 Films of the left leg were obtained in several projections.  I did not see any evidence of injury to the proximal fibula.  There was a small area of ectopic calcification of the fibular head on the lateral view but no evidence of a fracture.  I think the fibula tibia articulation is intact.  No evidence of degenerative change or abnormality about the left knee  XR Lumbar Spine 2-3 Views  Result Date: 01/08/2020 Films of the lumbar spine demonstrate some narrowing of the L5-S1 disc space.  There are some anterior osteophytes along the lower vertebral bodies.  No evidence of listhesis.  Some degenerative changes the L4-5 and L5-S1 facet joints.  No spinal curvature  XR Pelvis 1-2 Views  Result Date: 01/08/2020 Films of the pelvis were obtained demonstrating some ectopic calcification about the greater trochanters more on the right asymptomatic side than the left.  Hip joints appear to be well-maintained    PMFS History: Patient Active Problem List   Diagnosis Date Noted  . Pain in right lower leg 01/08/2020  . Hypertension   . Schizoaffective disorder, bipolar type (HCC) 07/23/2016  . Paranoid schizophrenia (HCC)   . Schizophrenia (HCC) 06/20/2015  . URI (upper respiratory infection) 01/30/2013  . Prediabetes 01/30/2013  . Preventative health care 11/23/2012  . Numbness and tingling in hands 11/23/2012  . Elevated blood pressure reading without  diagnosis of hypertension 10/31/2011  . Morbid obesity (HCC) 10/31/2011   Past Medical History:  Diagnosis Date  . Diverticulitis   . Hypertension   . Hypertension   . Morbid obesity (HCC)   . Paranoid schizophrenia (HCC)   . Phlebitis     Family History  Problem Relation Age of Onset  . Heart failure Father   . Hypertension Father   . Hypertension Brother   . Diabetes type II Paternal Grandfather   . Aortic aneurysm Maternal Grandfather   . Coronary artery disease Maternal Grandfather   . Stroke Maternal Grandfather     Past Surgical History:  Procedure Laterality Date  . CESAREAN SECTION  2001   Social History  Occupational History  . Not on file  Tobacco Use  . Smoking status: Former Smoker    Types: Cigarettes  . Smokeless tobacco: Never Used  Substance and Sexual Activity  . Alcohol use: Not Currently    Alcohol/week: 0.0 standard drinks  . Drug use: No  . Sexual activity: Not Currently    Birth control/protection: None

## 2020-01-17 ENCOUNTER — Encounter: Payer: Self-pay | Admitting: Internal Medicine

## 2020-01-17 ENCOUNTER — Ambulatory Visit (INDEPENDENT_AMBULATORY_CARE_PROVIDER_SITE_OTHER): Payer: 59 | Admitting: Internal Medicine

## 2020-01-17 ENCOUNTER — Other Ambulatory Visit: Payer: Self-pay | Admitting: Internal Medicine

## 2020-01-17 ENCOUNTER — Other Ambulatory Visit: Payer: Self-pay

## 2020-01-17 VITALS — BP 124/84 | HR 99 | Temp 98.2°F | Wt 238.1 lb

## 2020-01-17 DIAGNOSIS — I10 Essential (primary) hypertension: Secondary | ICD-10-CM

## 2020-01-17 DIAGNOSIS — R7303 Prediabetes: Secondary | ICD-10-CM

## 2020-01-17 DIAGNOSIS — R200 Anesthesia of skin: Secondary | ICD-10-CM

## 2020-01-17 DIAGNOSIS — R202 Paresthesia of skin: Secondary | ICD-10-CM

## 2020-01-17 DIAGNOSIS — Z780 Asymptomatic menopausal state: Secondary | ICD-10-CM

## 2020-01-17 NOTE — Patient Instructions (Signed)
-  Nice seeing you today!!  -Lab work today; will notify you once results are available.  -Referral to GYN today.  -Schedule follow up in 4 months.

## 2020-01-17 NOTE — Progress Notes (Signed)
Established Patient Office Visit     This visit occurred during the SARS-CoV-2 public health emergency.  Safety protocols were in place, including screening questions prior to the visit, additional usage of staff PPE, and extensive cleaning of exam room while observing appropriate contact time as indicated for disinfecting solutions.    CC/Reason for Visit: Chronic condition follow-up, mainly blood pressure  HPI: Margaret Christian is a 54 y.o. female who is coming in today for the above mentioned reasons. Past Medical History is significant for: Morbid obesity, hypertension, history of diverticulitis, she carries a history of paranoid schizophrenia, schizoaffective disorder in her chart, she does not believe this is correct, has not seen psychiatrist in years.  She has been dealing with some left leg and lower back pain.  She saw orthopedics, she has an MRI of the left leg scheduled soon.  She is fasting and is requesting labs today.  She has been taking lisinopril 10 mg that was started last visit.  She has not been taking it every day but feels that it agrees with her.   Past Medical/Surgical History: Past Medical History:  Diagnosis Date  . Diverticulitis   . Hypertension   . Hypertension   . Morbid obesity (HCC)   . Paranoid schizophrenia (HCC)   . Phlebitis     Past Surgical History:  Procedure Laterality Date  . CESAREAN SECTION  2001    Social History:  reports that she has quit smoking. Her smoking use included cigarettes. She has never used smokeless tobacco. She reports previous alcohol use. She reports that she does not use drugs.  Allergies: Allergies  Allergen Reactions  . Other Nausea And Vomiting and Hives  . Shellfish Allergy Hives and Nausea And Vomiting  . Aminobenzoate Rash    Family History:  Family History  Problem Relation Age of Onset  . Heart failure Father   . Hypertension Father   . Hypertension Brother   . Diabetes type II Paternal  Grandfather   . Aortic aneurysm Maternal Grandfather   . Coronary artery disease Maternal Grandfather   . Stroke Maternal Grandfather      Current Outpatient Medications:  .  Flax OIL, Take by mouth., Disp: , Rfl:  .  Ginger, Zingiber officinalis, (GINGER PO), Take by mouth., Disp: , Rfl:  .  ibuprofen (ADVIL,MOTRIN) 800 MG tablet, Take 1 tablet (800 mg total) by mouth every 8 (eight) hours as needed., Disp: 21 tablet, Rfl: 0 .  lisinopril (ZESTRIL) 10 MG tablet, Take 1 tablet (10 mg total) by mouth daily., Disp: 90 tablet, Rfl: 1 .  metroNIDAZOLE (FLAGYL) 500 MG tablet, Take 1 tablet (500 mg total) by mouth 3 (three) times daily., Disp: 30 tablet, Rfl: 0 .  Multiple Vitamin (MULTIVITAMIN) tablet, Take 1 tablet by mouth daily., Disp: , Rfl:  .  NON FORMULARY, , Disp: , Rfl:  .  NON FORMULARY, Vine lozenges, Disp: , Rfl:  .  Omega-3 Fatty Acids (FISH OIL) 500 MG CAPS, Take by mouth., Disp: , Rfl:  .  pantoprazole (PROTONIX) 40 MG tablet, Take 1 tablet (40 mg total) by mouth 2 (two) times daily before a meal., Disp: 60 tablet, Rfl: 0 .  vitamin B-12 (CYANOCOBALAMIN) 500 MCG tablet, Take 500 mcg by mouth daily., Disp: , Rfl:   Review of Systems:  Constitutional: Denies fever, chills, diaphoresis, appetite change and fatigue.  HEENT: Denies photophobia, eye pain, redness, hearing loss, ear pain, congestion, sore throat, rhinorrhea, sneezing, mouth sores, trouble swallowing,  neck pain, neck stiffness and tinnitus.   Respiratory: Denies SOB, DOE, cough, chest tightness,  and wheezing.   Cardiovascular: Denies chest pain, palpitations and leg swelling.  Gastrointestinal: Denies nausea, vomiting, abdominal pain, diarrhea, constipation, blood in stool and abdominal distention.  Genitourinary: Denies dysuria, urgency, frequency, hematuria, flank pain and difficulty urinating.  Endocrine: Denies: hot or cold intolerance, sweats, changes in hair or nails, polyuria, polydipsia. Musculoskeletal: Denies  myalgias. Skin: Denies pallor, rash and wound.  Neurological: Denies dizziness, seizures, syncope, weakness, light-headedness, numbness and headaches.  Hematological: Denies adenopathy. Easy bruising, personal or family bleeding history  Psychiatric/Behavioral: Denies suicidal ideation, mood changes, confusion, nervousness, sleep disturbance and agitation    Physical Exam: Vitals:   01/17/20 0827  BP: 124/84  Pulse: 99  Temp: 98.2 F (36.8 C)  TempSrc: Oral  SpO2: 97%  Weight: 238 lb 1.6 oz (108 kg)    Body mass index is 40.24 kg/m.   Constitutional: NAD, calm, comfortable Eyes: PERRL, lids and conjunctivae normal ENMT: Mucous membranes are moist.  Respiratory: clear to auscultation bilaterally, no wheezing, no crackles. Normal respiratory effort. No accessory muscle use.  Cardiovascular: Regular rate and rhythm, no murmurs / rubs / gallops. No extremity edema.  Abdomen: no tenderness, no masses palpated. No hepatosplenomegaly. Bowel sounds positive.  Psychiatric: Normal judgment and insight. Alert and oriented x 3. Normal mood.    Impression and Plan:  Essential hypertension  -Well-controlled, continue lisinopril, try to be more consistent in taking it daily.  Morbid obesity (HCC) -Discussed healthy lifestyle, including increased physical activity and better food choices to promote weight loss.  Prediabetes  - Plan: Hemoglobin A1c  Numbness and tingling in both hands - Plan: TSH, Vitamin B12, VITAMIN D 25 Hydroxy (Vit-D Deficiency, Fractures), Magnesium, A1c   Patient Instructions  -Nice seeing you today!!  -Lab work today; will notify you once results are available.  -Referral to GYN today.  -Schedule follow up in 4 months.     Chaya Jan, MD [facility]

## 2020-01-18 ENCOUNTER — Other Ambulatory Visit: Payer: Self-pay | Admitting: Internal Medicine

## 2020-01-18 ENCOUNTER — Encounter: Payer: Self-pay | Admitting: Internal Medicine

## 2020-01-18 DIAGNOSIS — E559 Vitamin D deficiency, unspecified: Secondary | ICD-10-CM

## 2020-01-18 DIAGNOSIS — E785 Hyperlipidemia, unspecified: Secondary | ICD-10-CM

## 2020-01-18 DIAGNOSIS — R7302 Impaired glucose tolerance (oral): Secondary | ICD-10-CM | POA: Insufficient documentation

## 2020-01-18 LAB — CBC WITH DIFFERENTIAL/PLATELET
Absolute Monocytes: 575 cells/uL (ref 200–950)
Basophils Absolute: 57 cells/uL (ref 0–200)
Basophils Relative: 0.8 %
Eosinophils Absolute: 170 cells/uL (ref 15–500)
Eosinophils Relative: 2.4 %
HCT: 45.7 % — ABNORMAL HIGH (ref 35.0–45.0)
Hemoglobin: 14.8 g/dL (ref 11.7–15.5)
Lymphs Abs: 1747 cells/uL (ref 850–3900)
MCH: 31 pg (ref 27.0–33.0)
MCHC: 32.4 g/dL (ref 32.0–36.0)
MCV: 95.8 fL (ref 80.0–100.0)
MPV: 10 fL (ref 7.5–12.5)
Monocytes Relative: 8.1 %
Neutro Abs: 4551 cells/uL (ref 1500–7800)
Neutrophils Relative %: 64.1 %
Platelets: 296 10*3/uL (ref 140–400)
RBC: 4.77 10*6/uL (ref 3.80–5.10)
RDW: 12.9 % (ref 11.0–15.0)
Total Lymphocyte: 24.6 %
WBC: 7.1 10*3/uL (ref 3.8–10.8)

## 2020-01-18 LAB — HEMOGLOBIN A1C
Hgb A1c MFr Bld: 5.8 % of total Hgb — ABNORMAL HIGH (ref <5.7)
Mean Plasma Glucose: 120 (calc)
eAG (mmol/L): 6.6 (calc)

## 2020-01-18 LAB — LIPID PANEL
Cholesterol: 262 mg/dL — ABNORMAL HIGH (ref <200)
HDL: 42 mg/dL — ABNORMAL LOW (ref >=50)
LDL Cholesterol (Calc): 182 mg/dL (calc) — ABNORMAL HIGH
Non-HDL Cholesterol (Calc): 220 mg/dL (calc) — ABNORMAL HIGH (ref <130)
Total CHOL/HDL Ratio: 6.2 (calc) — ABNORMAL HIGH (ref <5.0)
Triglycerides: 200 mg/dL — ABNORMAL HIGH (ref <150)

## 2020-01-18 LAB — COMPREHENSIVE METABOLIC PANEL
AG Ratio: 1.5 (calc) (ref 1.0–2.5)
ALT: 16 U/L (ref 6–29)
AST: 12 U/L (ref 10–35)
Albumin: 4.4 g/dL (ref 3.6–5.1)
Alkaline phosphatase (APISO): 67 U/L (ref 37–153)
BUN: 15 mg/dL (ref 7–25)
CO2: 26 mmol/L (ref 20–32)
Calcium: 9.5 mg/dL (ref 8.6–10.4)
Chloride: 103 mmol/L (ref 98–110)
Creat: 0.72 mg/dL (ref 0.50–1.05)
Globulin: 3 g/dL (calc) (ref 1.9–3.7)
Glucose, Bld: 106 mg/dL — ABNORMAL HIGH (ref 65–99)
Potassium: 4.6 mmol/L (ref 3.5–5.3)
Sodium: 137 mmol/L (ref 135–146)
Total Bilirubin: 0.4 mg/dL (ref 0.2–1.2)
Total Protein: 7.4 g/dL (ref 6.1–8.1)

## 2020-01-18 LAB — VITAMIN D 25 HYDROXY (VIT D DEFICIENCY, FRACTURES): Vit D, 25-Hydroxy: 19 ng/mL — ABNORMAL LOW (ref 30–100)

## 2020-01-18 LAB — VITAMIN B12: Vitamin B-12: 534 pg/mL (ref 200–1100)

## 2020-01-18 LAB — MAGNESIUM: Magnesium: 2.2 mg/dL (ref 1.5–2.5)

## 2020-01-18 LAB — TSH: TSH: 1.42 mIU/L

## 2020-01-18 MED ORDER — VITAMIN D (ERGOCALCIFEROL) 1.25 MG (50000 UNIT) PO CAPS: 50000.0000 [IU] | ORAL_CAPSULE | ORAL | 0 refills | Status: AC

## 2020-01-18 MED ORDER — ATORVASTATIN CALCIUM 40 MG PO TABS: 40.0000 mg | ORAL_TABLET | Freq: Every day | ORAL | 1 refills | Status: DC

## 2020-01-21 ENCOUNTER — Ambulatory Visit: Payer: 59 | Admitting: Podiatry

## 2020-01-21 ENCOUNTER — Other Ambulatory Visit: Payer: Self-pay

## 2020-01-21 DIAGNOSIS — L603 Nail dystrophy: Secondary | ICD-10-CM | POA: Diagnosis not present

## 2020-01-21 DIAGNOSIS — B351 Tinea unguium: Secondary | ICD-10-CM | POA: Diagnosis not present

## 2020-01-21 DIAGNOSIS — M79674 Pain in right toe(s): Secondary | ICD-10-CM | POA: Diagnosis not present

## 2020-01-21 MED ORDER — DOXYCYCLINE HYCLATE 100 MG PO TABS: 100.0000 mg | ORAL_TABLET | Freq: Two times a day (BID) | ORAL | 0 refills | Status: AC

## 2020-01-21 NOTE — Progress Notes (Signed)
   HPI: 54 y.o. female presenting today as a new patient for evaluation of a painful dystrophic nail to the right hallux nail plate.  Patient does have a history of an injury where she dropped a timber on her bilateral great toes.  She has had nail avulsion procedures performed in the past where they were permanently removed however the right hallux nail plate has grown back.  She states that it is sticking up and is symptomatic in close toed shoes.  She would like to have the nail permanently removed like she has in the past.  She presents for further treatment evaluation  Past Medical History:  Diagnosis Date  . Diverticulitis   . Hypertension   . Hypertension   . Morbid obesity (HCC)   . Paranoid schizophrenia (HCC)   . Phlebitis      Physical Exam: General: The patient is alert and oriented x3 in no acute distress.  Dermatology: Skin is warm, dry and supple bilateral lower extremities. Negative for open lesions or macerations.  Hyperkeratotic dystrophic discolored incurvated nail noted to the right hallux nail plate.  Vascular: Palpable pedal pulses bilaterally. No edema or erythema noted. Capillary refill within normal limits.  Neurological: Epicritic and protective threshold grossly intact bilaterally.   Musculoskeletal Exam: Range of motion within normal limits to all pedal and ankle joints bilateral. Muscle strength 5/5 in all groups bilateral.   Assessment: 1.  Pain due to onychomycosis of toenail right hallux 2.  Dystrophic nail right hallux secondary to history of trauma 3.  History of bilateral total permanent nail avulsions.  Regrowth of the right hallux nail plate  Plan of Care:  1. Patient evaluated.  2. Discussed treatment alternatives and plan of care. Explained nail avulsion procedure and post procedure course to patient. 3. Patient opted for permanent total nail avulsion of the right hallux nail plate.  4. Prior to procedure, local anesthesia infiltration utilized  using 3 ml of a 50:50 mixture of 2% plain lidocaine and 0.5% plain marcaine in a normal hallux block fashion and a betadine prep performed.  5.  Total permanent nail avulsion with chemical matrixectomy performed using 3x30sec applications of phenol followed by alcohol flush.  6. Light dressing applied. 7.  Prescription for doxycycline 100 mg 2 times daily #14 for prophylaxis  8.  The patient had success with Betadine application postsurgically.  Betadine liquid and topical ointment were provided today  9.  Return to clinic 3 weeks.  *Works at the [employer] on [employer] x18 years     Felecia Shelling, [facility]  Dr. Felecia Shelling, DPM    [address] [place], [place] [address]                Office [phone]  Fax [phone]

## 2020-01-21 NOTE — Patient Instructions (Signed)

## 2020-01-23 ENCOUNTER — Other Ambulatory Visit: Payer: Self-pay | Admitting: Internal Medicine

## 2020-01-23 DIAGNOSIS — E559 Vitamin D deficiency, unspecified: Secondary | ICD-10-CM

## 2020-01-24 ENCOUNTER — Other Ambulatory Visit: Payer: Self-pay

## 2020-01-24 ENCOUNTER — Ambulatory Visit
Admission: RE | Admit: 2020-01-24 | Discharge: 2020-01-24 | Disposition: A | Discharge status: Home / Self Care | Payer: 59 | Source: Ambulatory Visit | Attending: Internal Medicine | Admitting: Internal Medicine

## 2020-01-24 DIAGNOSIS — Z1231 Encounter for screening mammogram for malignant neoplasm of breast: Secondary | ICD-10-CM

## 2020-01-24 IMAGING — MG DIGITAL SCREENING BILAT W/ CAD
4 series · 4 of 4 positions shown · non-contrast
Comparison: Previous exam(s).

CLINICAL DATA: Screening.

EXAM:
DIGITAL SCREENING BILATERAL MAMMOGRAM WITH CAD

[L MLO]
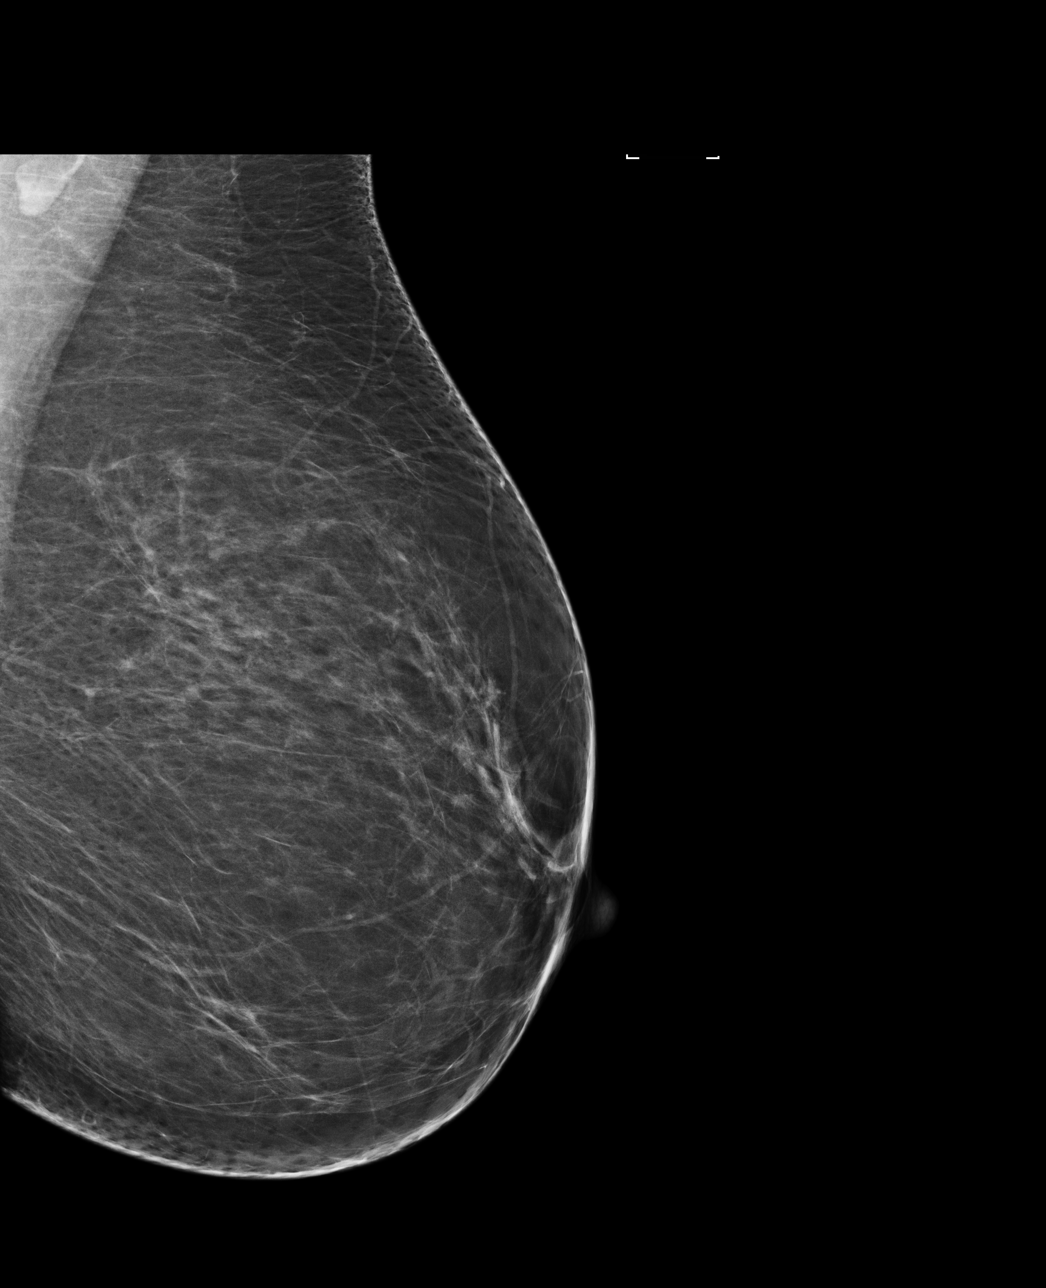

[R CC]
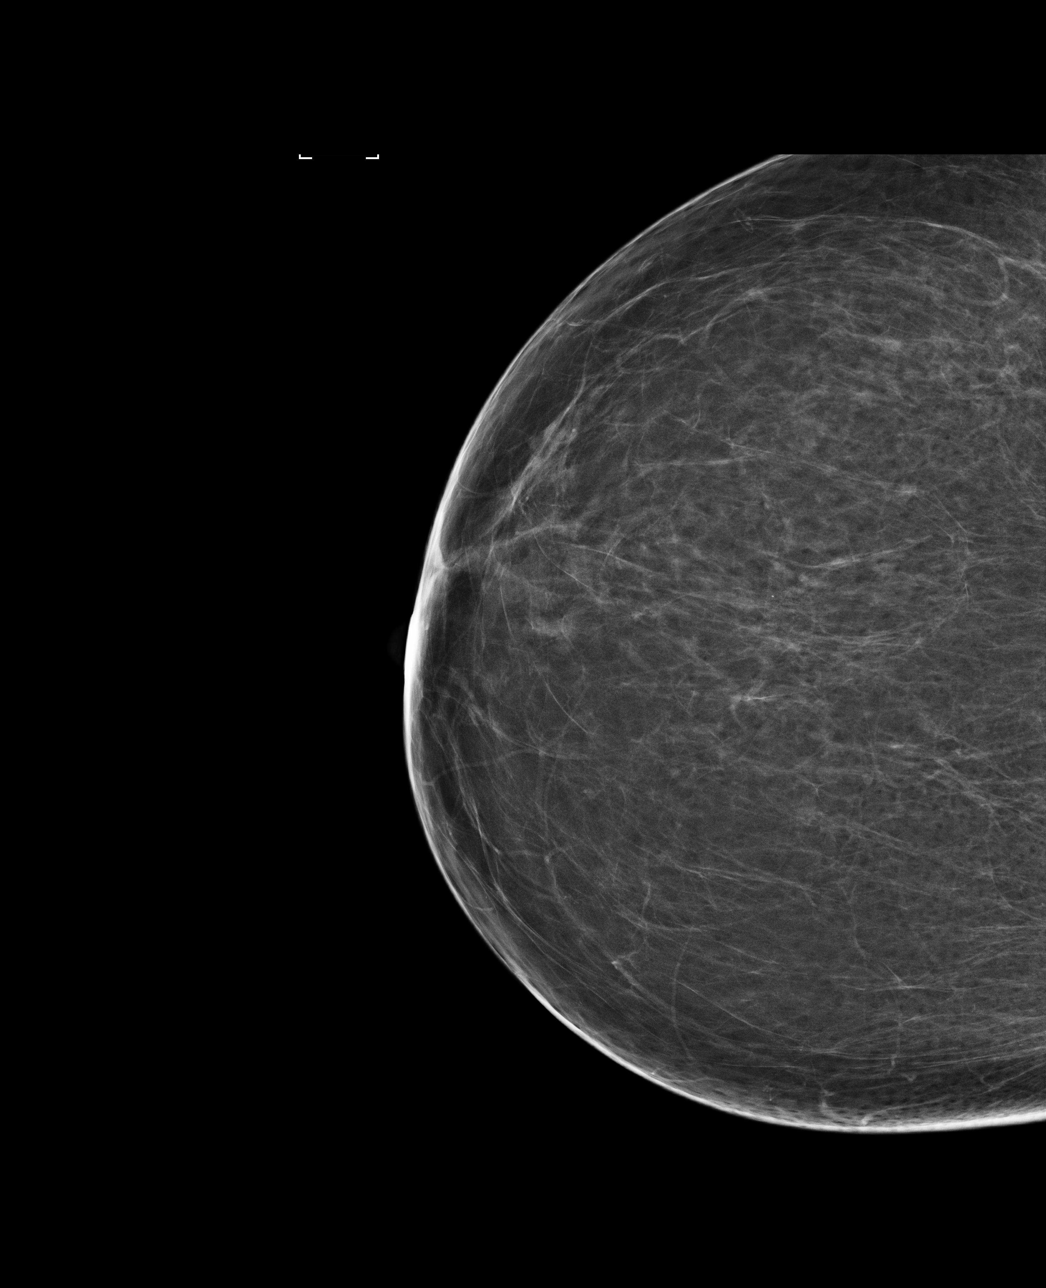

[L CC]
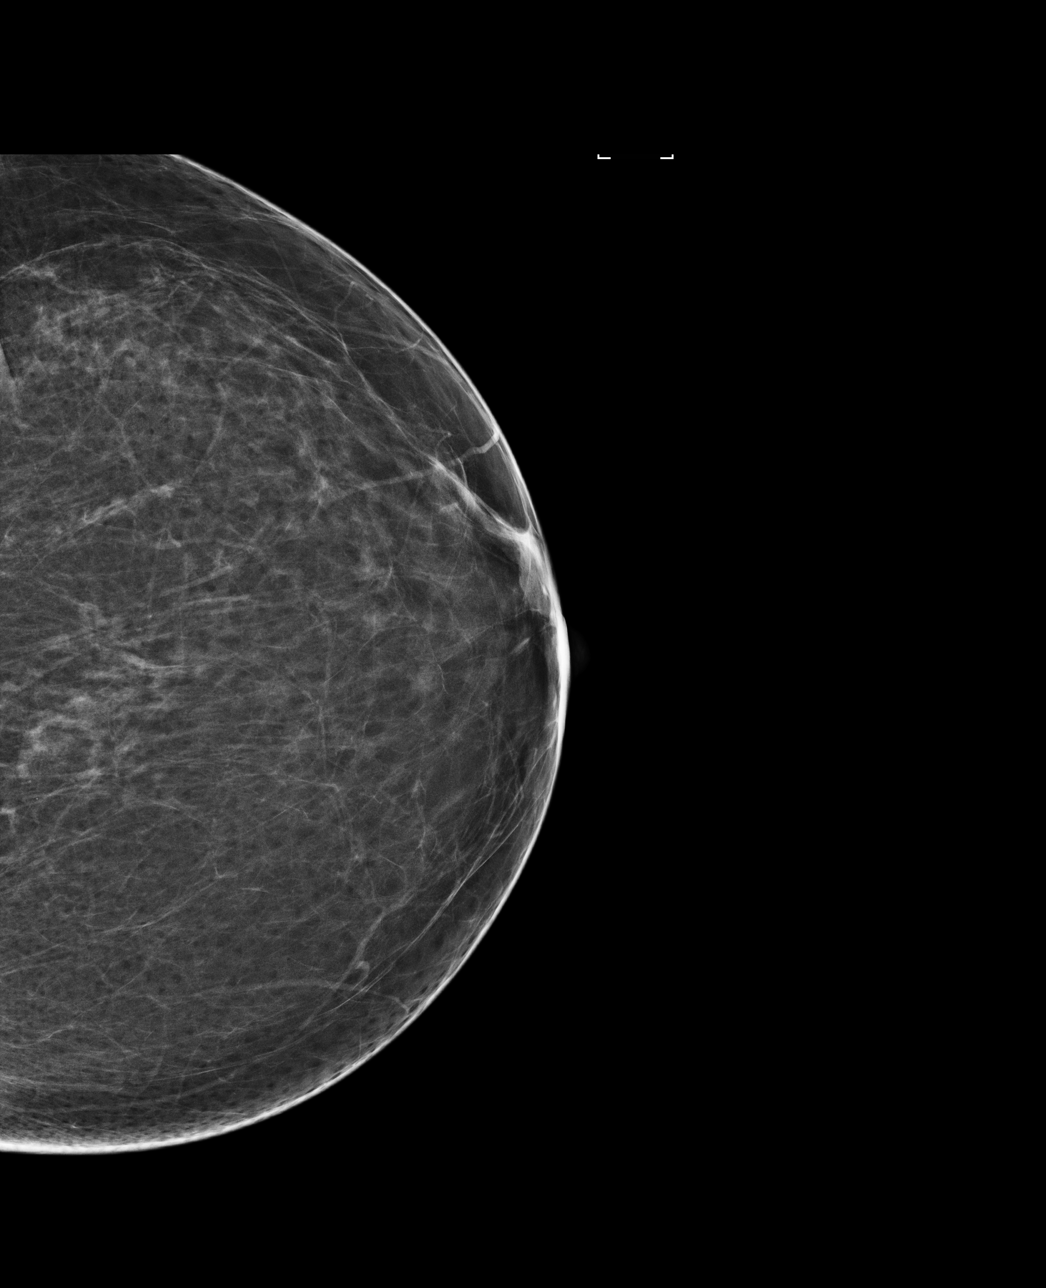

[R MLO]
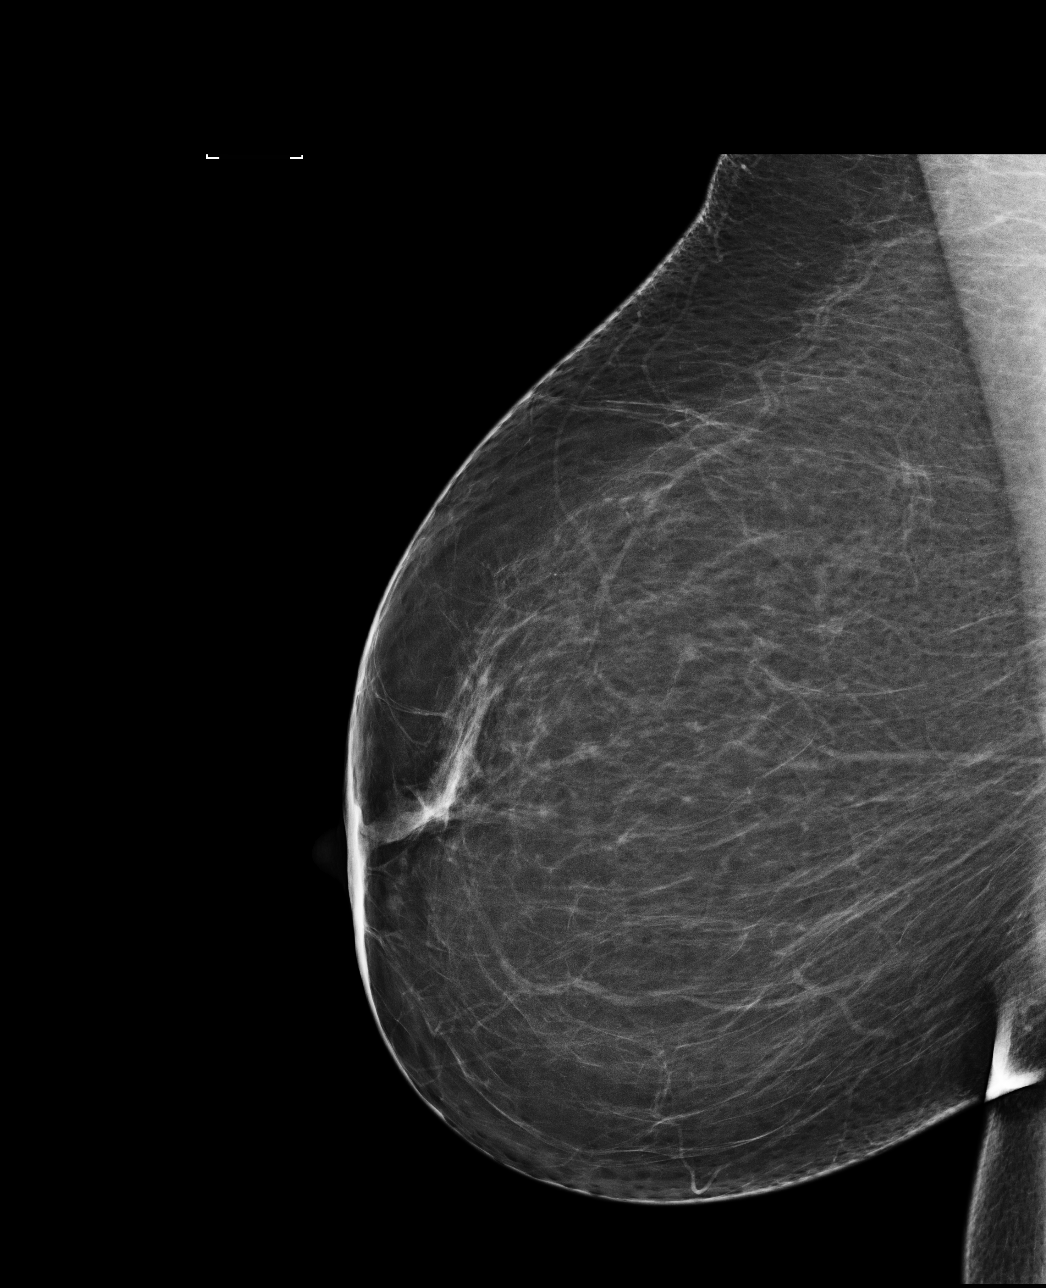

[4 of 4 positions shown; findings below may reference images not displayed]

ACR Breast Density Category b: There are scattered areas of
fibroglandular density.
FINDINGS: There are no findings suspicious for malignancy. Images were
processed with CAD.
IMPRESSION: No mammographic evidence of malignancy. A result letter of this
screening mammogram will be mailed directly to the patient.

RECOMMENDATION:
Screening mammogram in one year. (Code:[US])

BI-RADS CATEGORY  1: Negative.

## 2020-01-25 ENCOUNTER — Telehealth: Payer: Self-pay | Admitting: Orthopaedic Surgery

## 2020-01-25 NOTE — Telephone Encounter (Signed)
Duplicate message in chart. Lauren has sent previous message to Dr. Cleophas Dunker and is awaiting response.

## 2020-01-25 NOTE — Telephone Encounter (Signed)
I left voicemail for patient advising message has been sent to Dr. Cleophas Dunker to advise.

## 2020-01-25 NOTE — Telephone Encounter (Signed)
Please advise 

## 2020-01-25 NOTE — Telephone Encounter (Signed)
Patient called. She would like a MRI for her lower back and upper leg. Her call back number is [phone]

## 2020-01-25 NOTE — Telephone Encounter (Signed)
Patient called.   She wanted to know if an MRI could be ordered for her hip/lower back area as well. She said it was mentioned but at the moment she didn't think the pain was severe enough.   Call back: [phone]

## 2020-01-28 ENCOUNTER — Other Ambulatory Visit: Payer: Self-pay

## 2020-01-28 DIAGNOSIS — M79605 Pain in left leg: Secondary | ICD-10-CM

## 2020-01-28 NOTE — Telephone Encounter (Signed)
Options-voltaren gel, tylenol,aleve or advil in lieu of opiods

## 2020-01-28 NOTE — Telephone Encounter (Signed)
Ordered MRI 

## 2020-01-28 NOTE — Telephone Encounter (Signed)
Spoke with patient. She is aware that MRI has been ordered. She wants to know what she can take for pain. She is already taking OTC medicine and it does not help at all. She does NOT want Tramadol or any kind of opioid. Please advise.

## 2020-01-28 NOTE — Telephone Encounter (Signed)
Spoke with patient and relayed information. 

## 2020-01-28 NOTE — Telephone Encounter (Signed)
Ok to add MRI of L-S spine

## 2020-02-01 LAB — COLOGUARD: Cologuard: NEGATIVE

## 2020-02-05 ENCOUNTER — Other Ambulatory Visit: Payer: Self-pay

## 2020-02-05 ENCOUNTER — Ambulatory Visit
Admission: RE | Admit: 2020-02-05 | Discharge: 2020-02-05 | Disposition: A | Discharge status: Home / Self Care | Payer: 59 | Source: Ambulatory Visit | Attending: Orthopaedic Surgery | Admitting: Orthopaedic Surgery

## 2020-02-05 DIAGNOSIS — M79605 Pain in left leg: Secondary | ICD-10-CM

## 2020-02-05 DIAGNOSIS — M79661 Pain in right lower leg: Secondary | ICD-10-CM

## 2020-02-05 IMAGING — MR MR KNEE*L* W/O CM
4 of 6 series · 24 of 40 positions shown · non-contrast
Comparison: Radiographs [DATE]

CLINICAL DATA: Left lateral knee pain since a fall in [DATE].

EXAM:
MRI OF THE LEFT KNEE WITHOUT CONTRAST
TECHNIQUE: Multiplanar, multisequence MR imaging of the knee was performed. No
intravenous contrast was administered.

[Series 4: T2 fat-sat · coronal · 4.0mm · 0.59mm/px · 7 of 29 slices shown (1 of 2)]
[im 1/29]
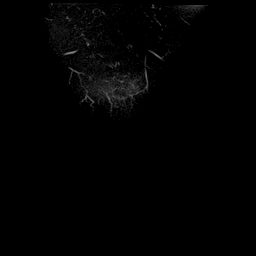
[im 5/29]
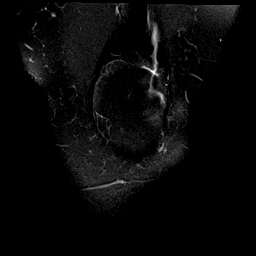
[im 10/29]
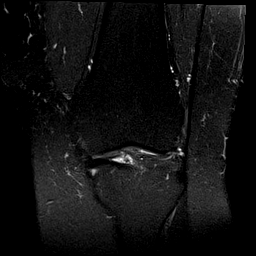
[im 15/29]
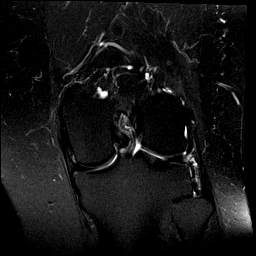
[im 19/29]
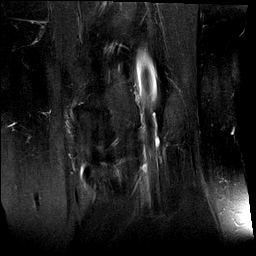
[im 24/29]
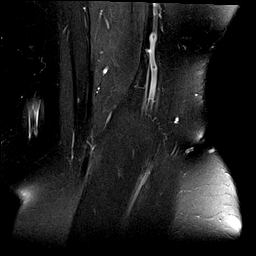
[im 29/29]
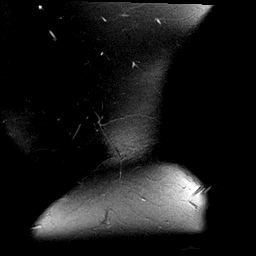

[Series 5: T1 · coronal · 4.0mm · 0.29mm/px · 5 of 29 slices shown]
[im 1/29]
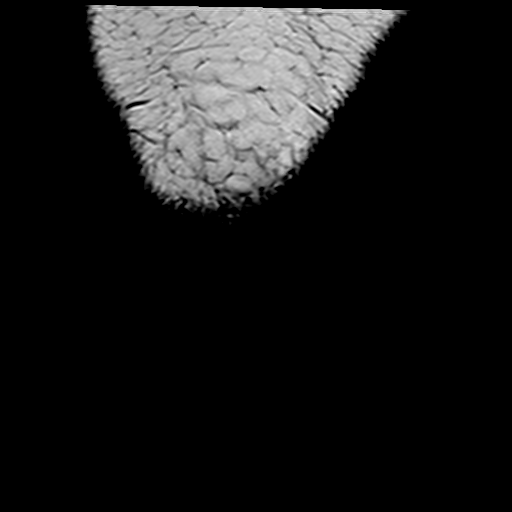
[im 5/29]
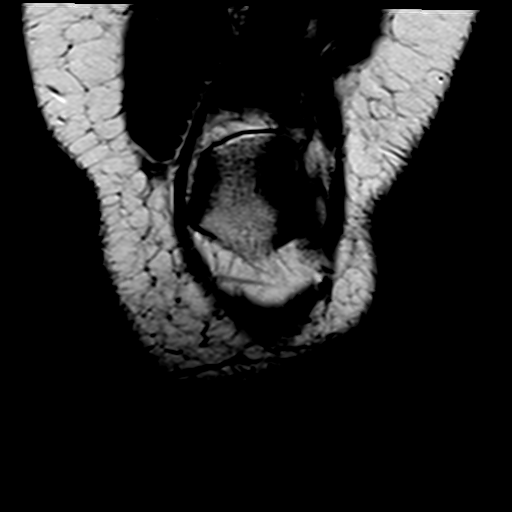
[im 10/29]
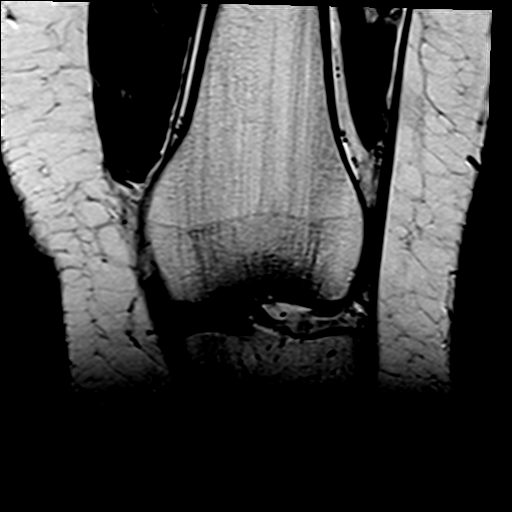
[im 15/29]
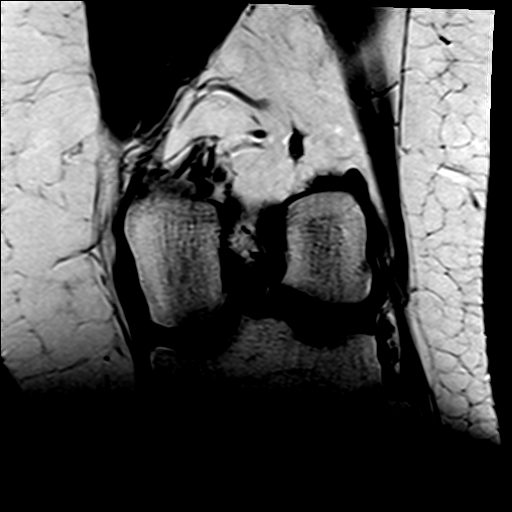
[im 24/29]
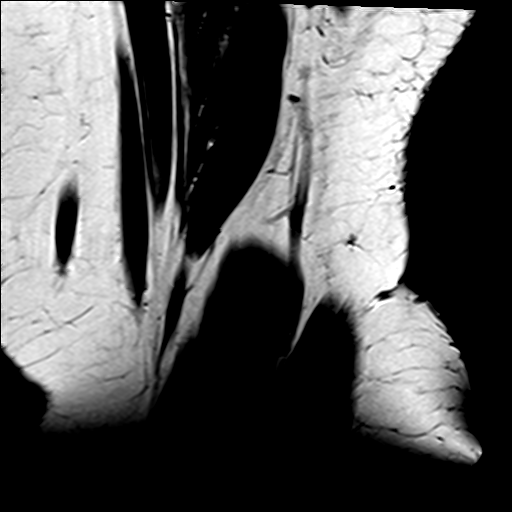

[Series 7: PD fat-sat · sagittal · 3.0mm · 0.29mm/px · 6 of 27 slices shown]
[im 1/27]
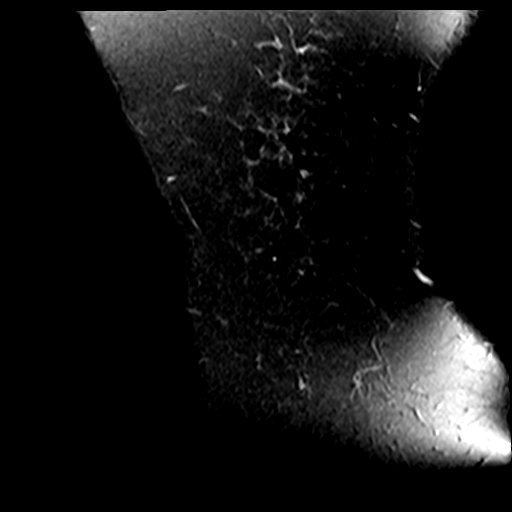
[im 6/27]
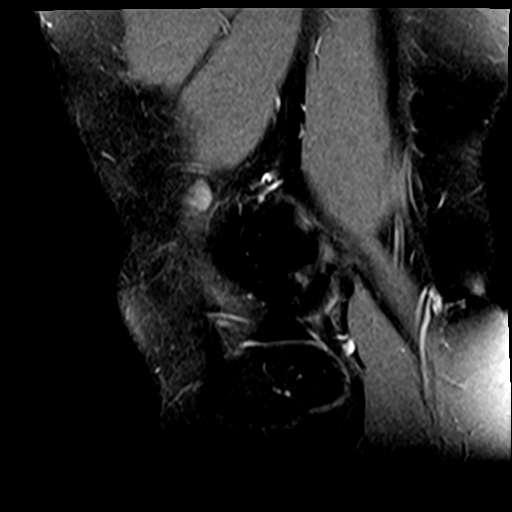
[im 11/27]
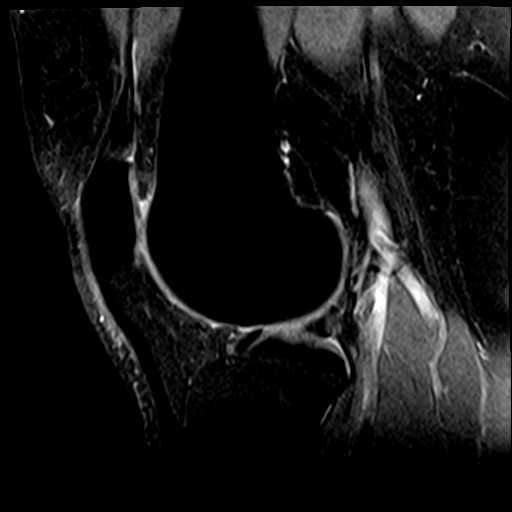
[im 16/27]
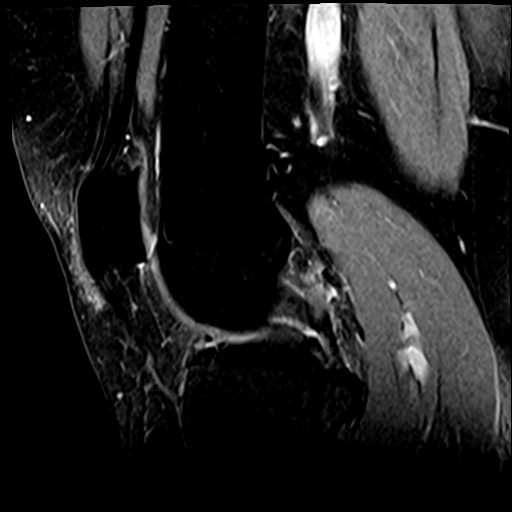
[im 21/27]
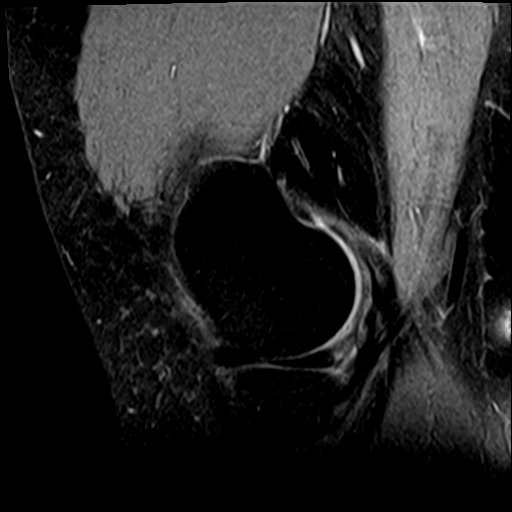
[im 27/27]
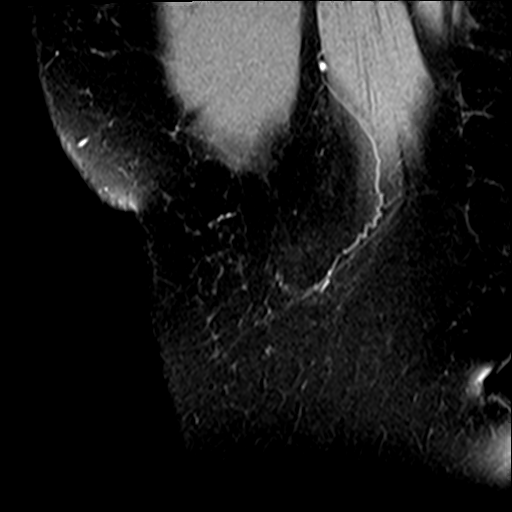

[Series 8: T2 fat-sat · sagittal · 3.0mm · 0.29mm/px · 6 of 27 slices shown (2 of 2)]
[im 1/27]
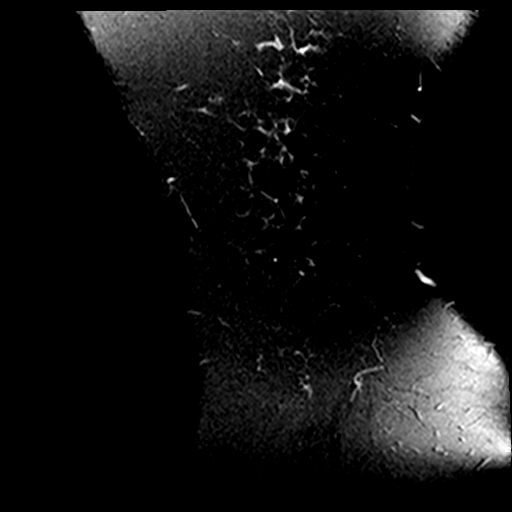
[im 6/27]
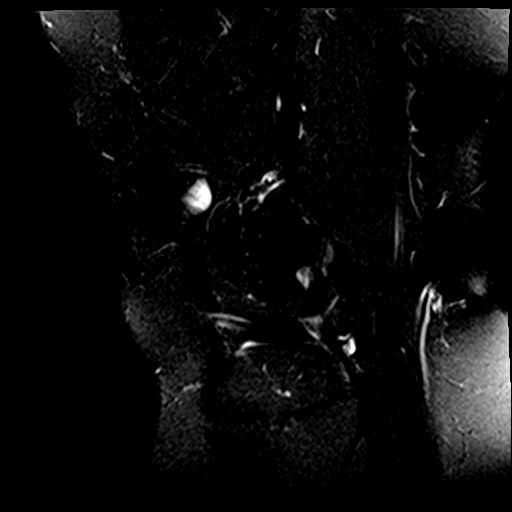
[im 11/27]
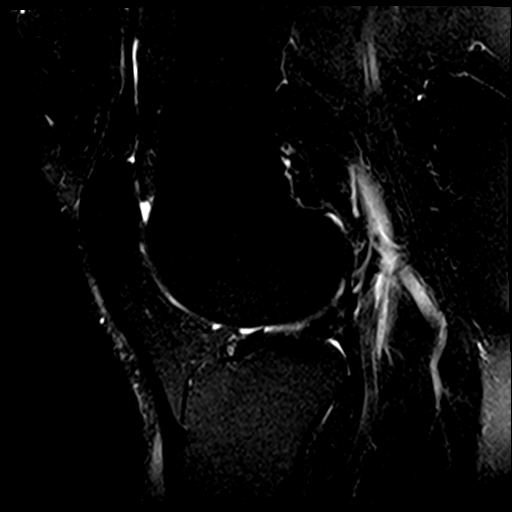
[im 16/27]
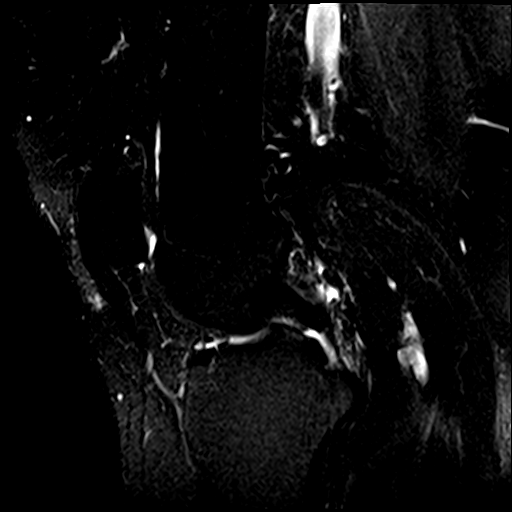
[im 21/27]
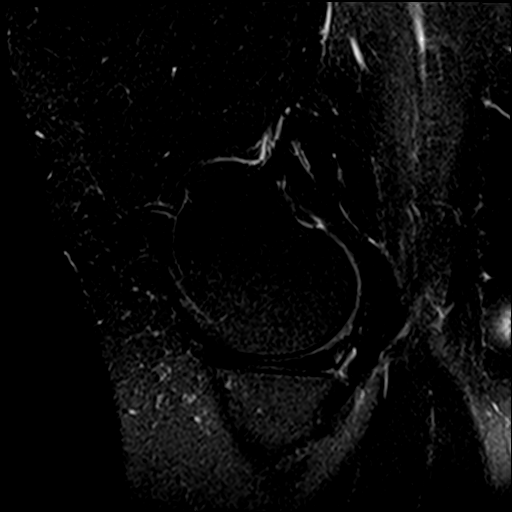
[im 27/27]
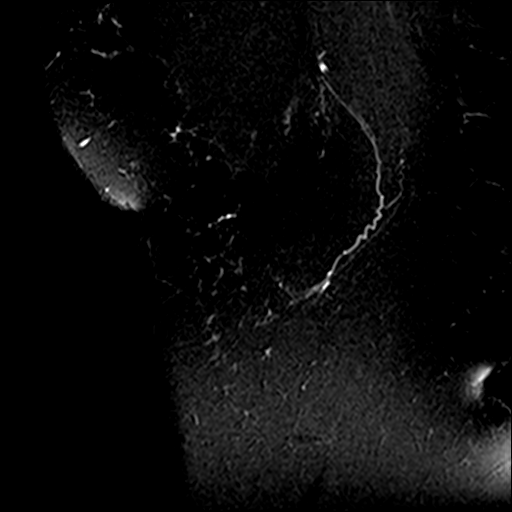

[24 of 40 positions shown; findings below may reference images not displayed]

FINDINGS: MENISCI

Medial meniscus:  Intact

Lateral meniscus:  Intact

LIGAMENTS

Cruciates:  Lateral intact

Collaterals:  Collateral intact

CARTILAGE

Patellofemoral: Age advanced degenerative chondrosis with cartilage
thinning, fissuring and fraying.

Medial:  Mild to moderate degenerative chondrosis.

Lateral:  Mild degenerative chondrosis.

Joint:  No joint effusion.

Popliteal Fossa:  L5 no popliteal mass or Baker's cyst.

Extensor Mechanism: Extensor the patella retinacular structures are
intact and the quadriceps and patellar tendons are intact.

Bones: No acute bony findings. No bone contusion, marrow edema or
osteochondral lesion.

Other: Unremarkable knee musculature.
IMPRESSION: 1. Intact ligamentous structures and no acute bony findings.
2. No meniscal tears.
3. Age advanced degenerative chondrosis involving the patellofemoral
joint.
4. No joint effusion or Baker's cyst.

## 2020-02-07 ENCOUNTER — Telehealth: Payer: Self-pay | Admitting: *Deleted

## 2020-02-07 NOTE — Telephone Encounter (Signed)
Patient is aware 

## 2020-02-07 NOTE — Telephone Encounter (Signed)
Cologuard negative. Left message on machine for patient to return our call.

## 2020-02-18 ENCOUNTER — Other Ambulatory Visit: Payer: Self-pay

## 2020-02-18 ENCOUNTER — Ambulatory Visit (INDEPENDENT_AMBULATORY_CARE_PROVIDER_SITE_OTHER): Payer: 59 | Admitting: Podiatry

## 2020-02-18 DIAGNOSIS — L603 Nail dystrophy: Secondary | ICD-10-CM

## 2020-02-18 DIAGNOSIS — B351 Tinea unguium: Secondary | ICD-10-CM | POA: Diagnosis not present

## 2020-02-18 DIAGNOSIS — M79674 Pain in right toe(s): Secondary | ICD-10-CM

## 2020-02-18 NOTE — Progress Notes (Signed)
   Subjective: 54 y.o. female presents today status post permanent nail avulsion procedure of the right hallux nail plate that was performed on 01/21/2020.  Patient states that she is doing well.  She still has some redness and drainage to the area.  She has been applying the Betadine ointment and also soaking her foot in Epsom salt with bleach, not my recommendation.  She presents for follow-up treatment and evaluation  Past Medical History:  Diagnosis Date  . Diverticulitis   . Hypertension   . Hypertension   . Morbid obesity (HCC)   . Paranoid schizophrenia (HCC)   . Phlebitis     Objective: Skin is warm, dry and supple. Nail and respective nail fold appears to be healing appropriately. Open wound to the associated nail fold with a granular wound base and moderate amount of fibrotic tissue. Minimal drainage noted. Mild erythema around the periungual region likely due to phenol chemical matricectomy.  Assessment: #1 postop permanent total nail avulsion right hallux nail plate #2 open wound periungual nail fold of respective digit.   Plan of care: #1 patient was evaluated  #2 debridement of open wound was performed to the periungual border of the respective toe using a currette. Antibiotic ointment and Band-Aid was applied. #3 patient is to return to clinic on a PRN basis.  *Works at the [employer] on [employer] x18 years  Felecia Shelling, [facility]  Dr. Felecia Shelling, DPM    [address]                                        [place], [place] [address]                Office [phone]  Fax [phone]

## 2020-02-19 LAB — HM PAP SMEAR

## 2020-02-23 ENCOUNTER — Ambulatory Visit
Admission: RE | Admit: 2020-02-23 | Discharge: 2020-02-23 | Disposition: A | Discharge status: Home / Self Care | Payer: 59 | Source: Ambulatory Visit | Attending: Orthopaedic Surgery | Admitting: Orthopaedic Surgery

## 2020-02-23 DIAGNOSIS — M79605 Pain in left leg: Secondary | ICD-10-CM

## 2020-02-23 IMAGING — MR MR LUMBAR SPINE W/O CM
4 of 5 series · 25 of 48 positions shown · non-contrast
Comparison: Lumbar radiographs [DATE]

CLINICAL DATA: Low back pain for greater than 6 weeks. Radiates
down right leg.

EXAM:
MRI LUMBAR SPINE WITHOUT CONTRAST
TECHNIQUE: Multiplanar, multisequence MR imaging of the lumbar spine was
performed. No intravenous contrast was administered.

[Series 3: T2 post-contrast · sagittal · 4.0mm · 0.53mm/px · 6 of 16 slices shown]
[im 1/16]
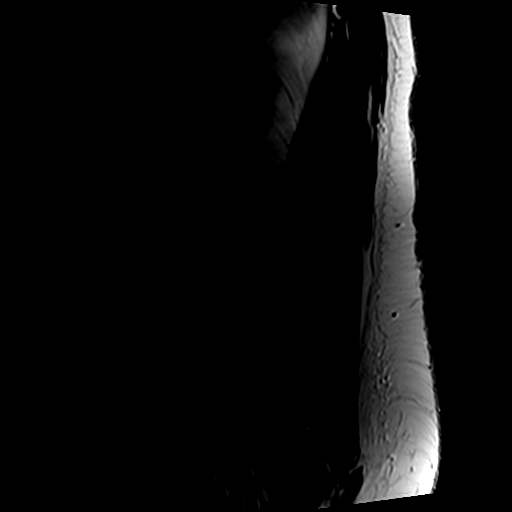
[im 4/16]
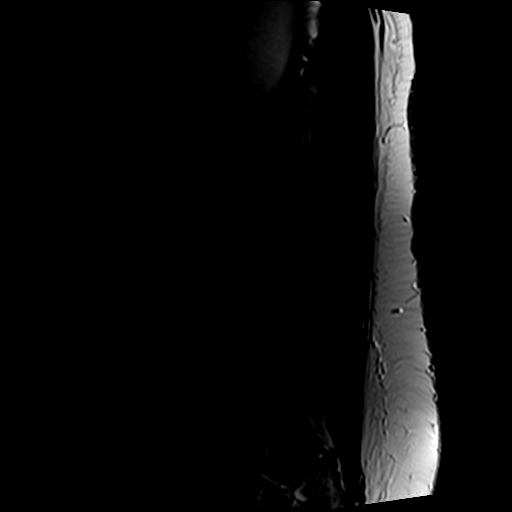
[im 7/16]
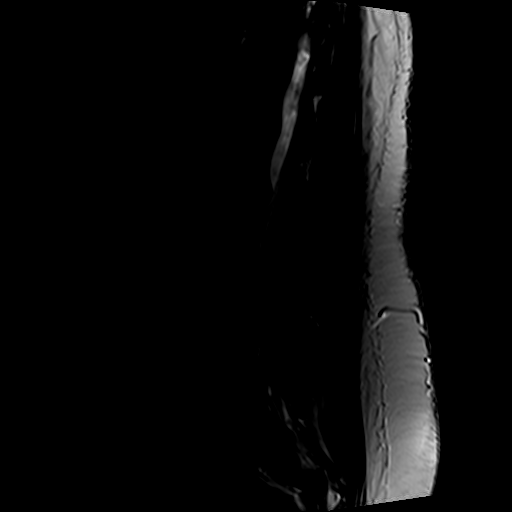
[im 10/16]
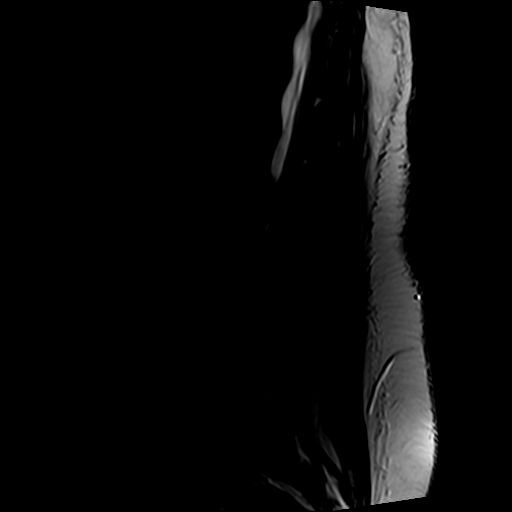
[im 13/16]
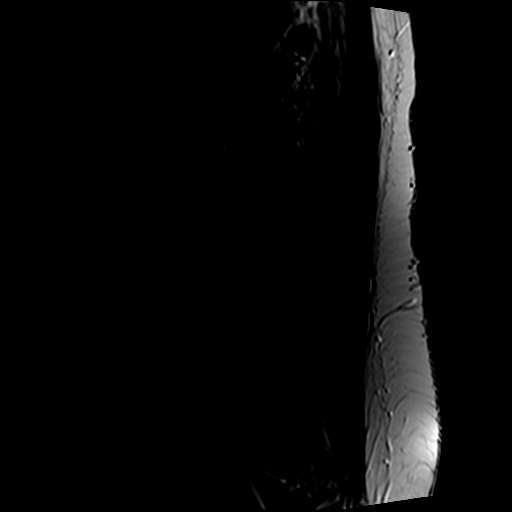
[im 16/16]
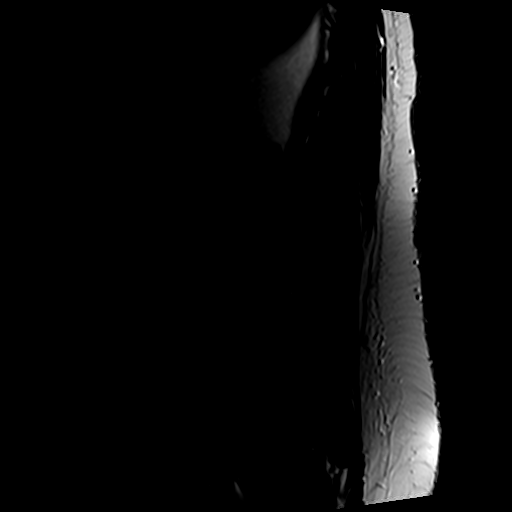

[Series 5: T1 · sagittal · 4.0mm · 0.53mm/px · 6 of 16 slices shown (1 of 2)]
[im 1/16]
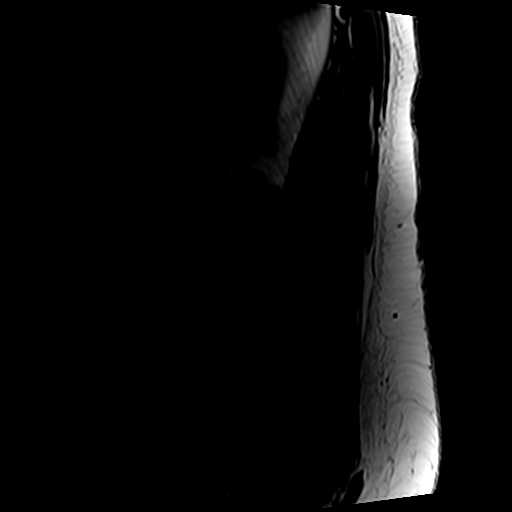
[im 4/16]
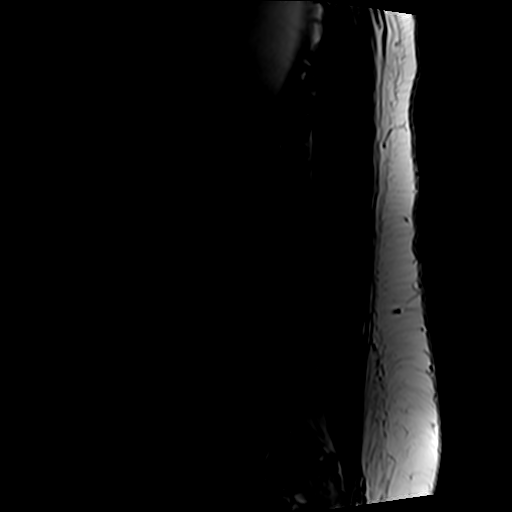
[im 7/16]
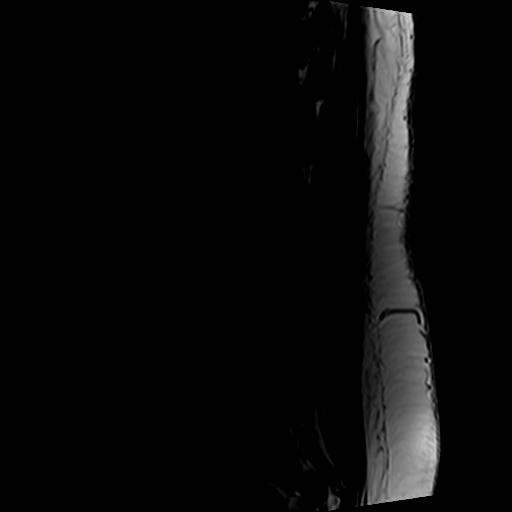
[im 10/16]
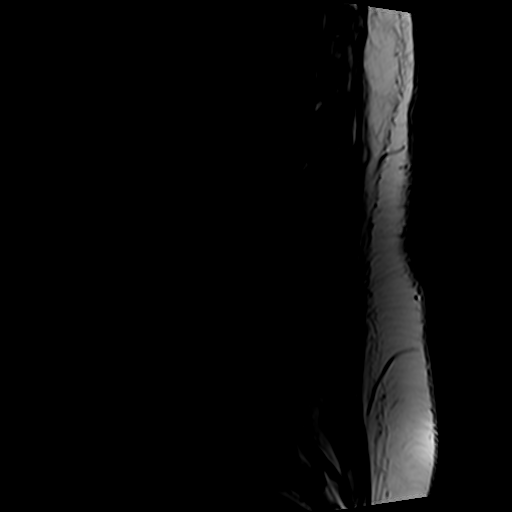
[im 13/16]
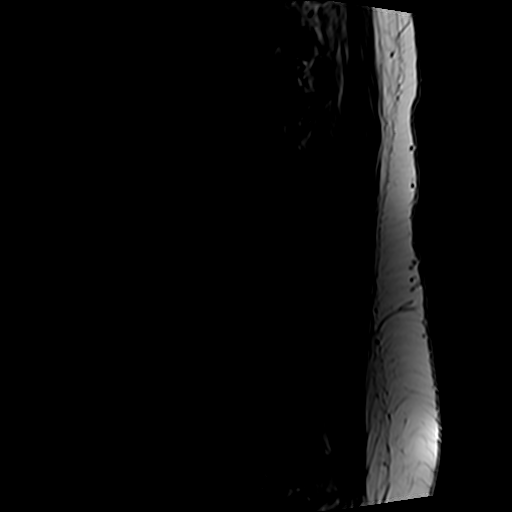
[im 16/16]
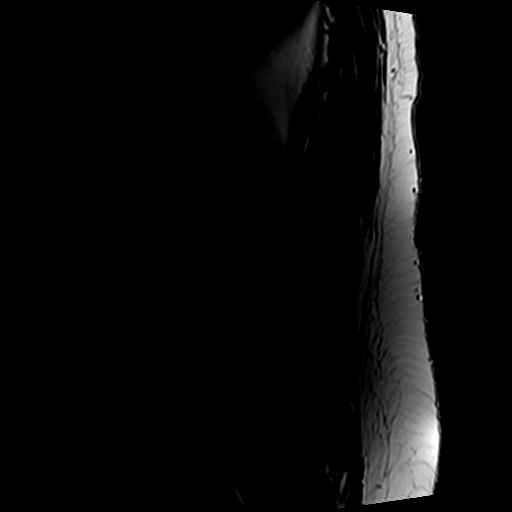

[Series 6: T2 · axial · 4.0mm · 0.70mm/px · z∈[-148,+58]mm · 9 of 38 slices shown]
[im 1/38]
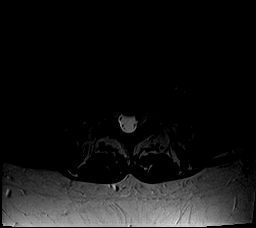
[im 6/38]
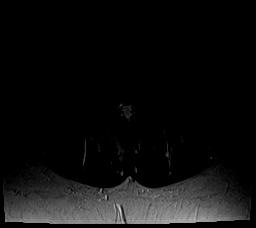
[im 11/38]
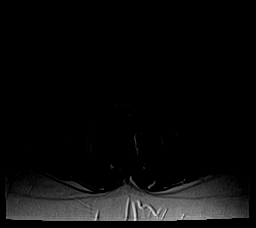
[im 16/38]
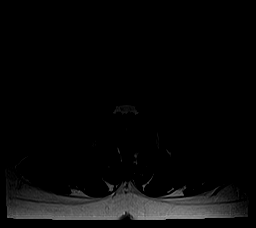
[im 19/38]
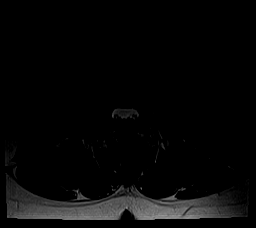
[im 22/38]
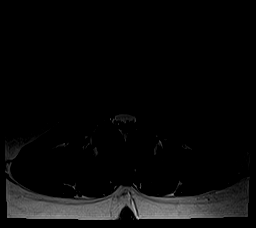
[im 27/38]
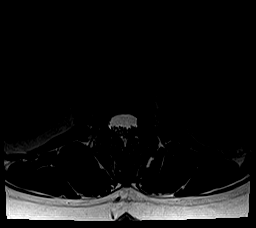
[im 32/38]
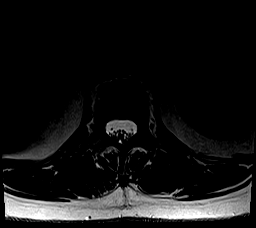
[im 38/38]
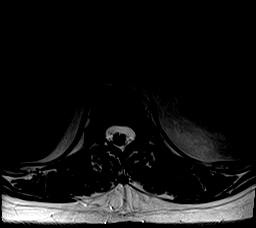

[Series 7: T1 · axial · 4.0mm · 0.35mm/px · z∈[-148,+27]mm · 4 of 38 slices shown (2 of 2)]
[im 1/38]
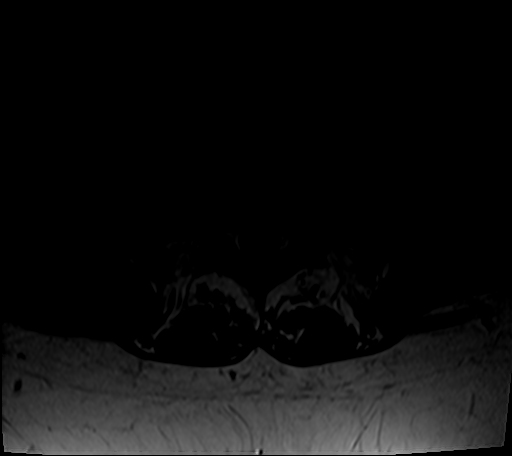
[im 6/38]
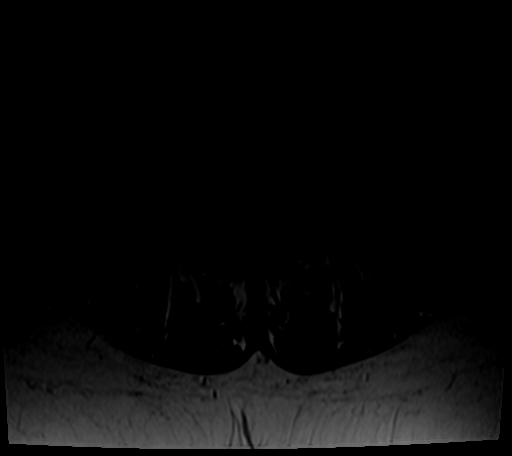
[im 19/38]
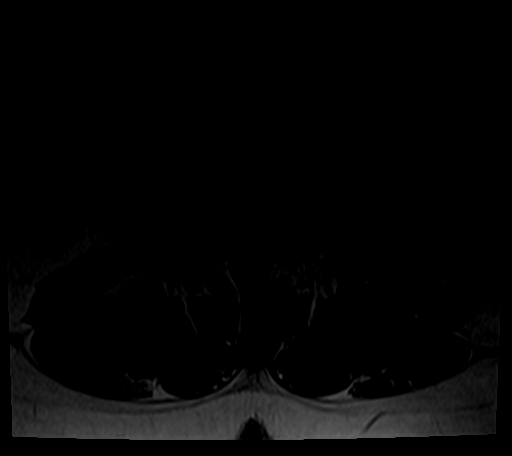
[im 32/38]
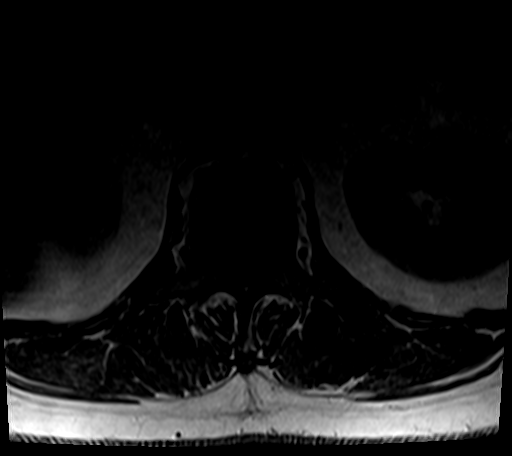

[25 of 48 positions shown; findings below may reference images not displayed]

FINDINGS: Segmentation: Standard. Five lumbar type non rib-bearing vertebral
bodies.

Alignment:  Trace retrolisthesis of L2 on L3 and L5 on S1.

Vertebrae: No abnormal marrow signal to suggest acute fracture,
osteomyelitis, or primary bone lesion.

Conus medullaris and cauda equina: Conus extends to the L1-L2 level.
Conus and cauda equina appear normal.

Paraspinal and other soft tissues: Negative.

Disc levels:

T12-L1: No significant disc protrusion, foraminal stenosis, or canal
stenosis.

L1-L2: No significant disc protrusion, foraminal stenosis, or canal
stenosis.

L2-L3: Trace retrolisthesis of L2 on L3. Small broad-based disc
bulge. No significant canal or foraminal stenosis.

L3-L4: Minimal broad-based disc bulge, mild bilateral facet
hypertrophy. Resulting mild right foraminal stenosis. No significant
canal stenosis.

L4-L5: Broad-based disc bulge with superimposed small left
subarticular/foraminal disc protrusion. Bilateral facet hypertrophy.
Resulting mild left subarticular recess stenosis with disc
contacting the descending left L5 nerve roots. Overall, mild canal
stenosis. Mild right foraminal stenosis. No significant left
foraminal stenosis.

L5-S1: Disc height loss. Broad-based disc bulge. Bilateral facet
hypertrophy. Resulting mild left greater than right subarticular
recess stenosis with disc contacting the left S1 nerve root.
Overall, no significant canal stenosis. Mild bilateral foraminal
stenosis.
IMPRESSION: 1. Mild canal stenosis at L4-L5. Left greater than right
subarticular recess stenosis at L4-L5 and L5-S1 with disc contacting
the descending left L5 and S1 nerve roots without compression.
2. Mild foraminal stenosis on the right at L3-L4 and L4-L5 and
bilaterally at L5-S1.

## 2020-02-28 ENCOUNTER — Telehealth: Payer: Self-pay

## 2020-02-28 NOTE — Telephone Encounter (Signed)
Please advise. Do you want patient to return for results or do you want to call?

## 2020-02-28 NOTE — Telephone Encounter (Signed)
Please refer to [facility] for ESI- I called pt with results

## 2020-02-28 NOTE — Telephone Encounter (Signed)
Patient called she is requesting a call back for MRI results she had her MRI done 9/25. Call back:[phone]

## 2020-02-29 ENCOUNTER — Other Ambulatory Visit: Payer: Self-pay

## 2020-02-29 DIAGNOSIS — M79661 Pain in right lower leg: Secondary | ICD-10-CM

## 2020-02-29 DIAGNOSIS — M79605 Pain in left leg: Secondary | ICD-10-CM

## 2020-02-29 NOTE — Telephone Encounter (Signed)
Order has been placed.

## 2020-03-03 ENCOUNTER — Encounter: Payer: Self-pay | Admitting: Internal Medicine

## 2020-03-17 ENCOUNTER — Telehealth: Payer: Self-pay | Admitting: Internal Medicine

## 2020-03-17 NOTE — Telephone Encounter (Signed)
Patient received a call with results- she is returning call to office to get her results.

## 2020-03-18 ENCOUNTER — Encounter: Payer: Self-pay | Admitting: Internal Medicine

## 2020-03-18 NOTE — Telephone Encounter (Signed)
Spoke with patient.

## 2020-08-15 ENCOUNTER — Other Ambulatory Visit: Payer: Self-pay | Admitting: Internal Medicine

## 2020-08-15 DIAGNOSIS — I10 Essential (primary) hypertension: Secondary | ICD-10-CM

## 2020-09-16 ENCOUNTER — Telehealth: Payer: Self-pay | Admitting: Internal Medicine

## 2020-09-16 ENCOUNTER — Other Ambulatory Visit: Payer: Self-pay | Admitting: Internal Medicine

## 2020-09-16 DIAGNOSIS — I10 Essential (primary) hypertension: Secondary | ICD-10-CM

## 2020-09-16 DIAGNOSIS — E785 Hyperlipidemia, unspecified: Secondary | ICD-10-CM

## 2020-09-16 MED ORDER — LISINOPRIL 10 MG PO TABS: 1.0000 | ORAL_TABLET | Freq: Every day | ORAL | 1 refills | Status: AC

## 2020-09-16 NOTE — Telephone Encounter (Signed)
lisinopril (ZESTRIL) 10 MG tablet  Pt requesting a 6 month supply refill [id number] [place], [place] - [address]  Phone:  [phone] Fax:  [phone]

## 2020-09-16 NOTE — Telephone Encounter (Signed)
Refill sent.

## 2021-03-12 ENCOUNTER — Other Ambulatory Visit: Payer: Self-pay

## 2021-03-12 ENCOUNTER — Encounter (HOSPITAL_COMMUNITY): Payer: Self-pay | Admitting: Emergency Medicine

## 2021-03-12 ENCOUNTER — Emergency Department (HOSPITAL_COMMUNITY)
Admission: EM | Admit: 2021-03-12 | Discharge: 2021-03-12 | Disposition: A | Discharge status: Home / Self Care | Payer: 59 | Attending: Emergency Medicine | Admitting: Emergency Medicine

## 2021-03-12 DIAGNOSIS — J029 Acute pharyngitis, unspecified: Secondary | ICD-10-CM | POA: Diagnosis not present

## 2021-03-12 DIAGNOSIS — I10 Essential (primary) hypertension: Secondary | ICD-10-CM | POA: Insufficient documentation

## 2021-03-12 DIAGNOSIS — Z79899 Other long term (current) drug therapy: Secondary | ICD-10-CM | POA: Insufficient documentation

## 2021-03-12 DIAGNOSIS — M542 Cervicalgia: Secondary | ICD-10-CM | POA: Insufficient documentation

## 2021-03-12 DIAGNOSIS — Z87891 Personal history of nicotine dependence: Secondary | ICD-10-CM | POA: Insufficient documentation

## 2021-03-12 DIAGNOSIS — R000 Tachycardia, unspecified: Secondary | ICD-10-CM | POA: Insufficient documentation

## 2021-03-12 DIAGNOSIS — Z20822 Contact with and (suspected) exposure to covid-19: Secondary | ICD-10-CM | POA: Insufficient documentation

## 2021-03-12 DIAGNOSIS — H9202 Otalgia, left ear: Secondary | ICD-10-CM | POA: Insufficient documentation

## 2021-03-12 LAB — RESP PANEL BY RT-PCR (FLU A&B, COVID) ARPGX2
Influenza A by PCR: NEGATIVE
Influenza B by PCR: NEGATIVE
SARS Coronavirus 2 by RT PCR: NEGATIVE

## 2021-03-12 LAB — GROUP A STREP BY PCR: Group A Strep by PCR: NOT DETECTED

## 2021-03-12 MED ORDER — ACETAMINOPHEN 500 MG PO TABS
1000.0000 mg | ORAL_TABLET | Freq: Once | ORAL | Status: AC
  Administered 2021-03-12: 1000 mg via ORAL
  Filled 2021-03-12: qty 2

## 2021-03-12 MED ORDER — AZITHROMYCIN 250 MG PO TABS: ORAL_TABLET | ORAL | 0 refills | Status: AC

## 2021-03-12 MED ORDER — LIDOCAINE VISCOUS HCL 2 % MT SOLN: 15.0000 mL | OROMUCOSAL | 0 refills | Status: AC | PRN

## 2021-03-12 NOTE — Discharge Instructions (Addendum)
You have been evaluated for your sore throat.  At this time your strep test is negative, your flu test and COVID test also negative.  Continue to take Tylenol over-the-counter as needed for your sore throat as this is likely a viral infection.  Use viscous lidocaine as directed rinse for comfort.  If your symptoms persist for more than 3 to 5 days, consider taking antibiotic prescribed.  Return if you have any concern.  Your blood pressure is elevated today, please have it rechecked by your provider at your earliest convenience.

## 2021-03-12 NOTE — ED Triage Notes (Signed)
Pt woke up w/ a sore thorat this morning.  She also has had a low grade fever.

## 2021-03-12 NOTE — ED Provider Notes (Signed)
[facility] EMERGENCY DEPARTMENT Provider Note   CSN: [id number] Arrival date & time: 03/12/21  0319     History Chief Complaint  Patient presents with   Sore Throat    Margaret Christian is a 55 y.o. female.  The history is provided by the patient and medical records. No language interpreter was used.  Sore Throat   55 year old morbidly obese female with significant history of schizophrenia, hypertension, who presents with a chief complaints of sore throat.  Patient reports since last night she having sore throat.  She also endorsed fever.  She endorsed some mild discomfort in her left ear and side of the neck.  She denies any significant runny nose, sinus congestion, cough, chest pain, abdominal pain, nausea vomiting or diarrhea.  She did try several over-the-counter medication without adequate relief.  She reports symptoms felt similar to strep infection that she had in the past.  She has been vaccinated for COVID-19.  She denies any recent sick contact.  She endorses symptoms moderate in severity.  She was having trouble sleeping last night.  Past Medical History:  Diagnosis Date   Diverticulitis    Hypertension    Hypertension    Morbid obesity (HCC)    Paranoid schizophrenia (HCC)    Phlebitis     Patient Active Problem List   Diagnosis Date Noted   Vitamin D deficiency 01/18/2020   IGT (impaired glucose tolerance) 01/18/2020   Hyperlipidemia 01/18/2020   Pain in right lower leg 01/08/2020   Hypertension    Schizoaffective disorder, bipolar type (HCC) 07/23/2016   Paranoid schizophrenia (HCC)    Schizophrenia (HCC) 06/20/2015   URI (upper respiratory infection) 01/30/2013   Prediabetes 01/30/2013   Preventative health care 11/23/2012   Numbness and tingling in both hands 11/23/2012   Elevated blood pressure reading without diagnosis of hypertension 10/31/2011   Morbid obesity (HCC) 10/31/2011    Past Surgical History:  Procedure Laterality Date    CESAREAN SECTION  2001     OB History     Gravida  0   Para  0   Term  0   Preterm  0   AB  0   Living         SAB  0   IAB  0   Ectopic  0   Multiple      Live Births              Family History  Problem Relation Age of Onset   Heart failure Father    Hypertension Father    Hypertension Brother    Diabetes type II Paternal Grandfather    Aortic aneurysm Maternal Grandfather    Coronary artery disease Maternal Grandfather    Stroke Maternal Grandfather     Social History   Tobacco Use   Smoking status: Former    Types: Cigarettes   Smokeless tobacco: Never  Substance Use Topics   Alcohol use: Not Currently    Alcohol/week: 0.0 standard drinks   Drug use: No    Home Medications Prior to Admission medications   Medication Sig Start Date End Date Taking? Authorizing Provider  atorvastatin (LIPITOR) 40 MG tablet TAKE 1 TABLET BY MOUTH EVERY DAY 09/16/20   Philip Aspen, Limmie Patricia, MD  doxycycline (VIBRA-TABS) 100 MG tablet Take 1 tablet (100 mg total) by mouth 2 (two) times daily. 01/21/20   Felecia Shelling, DPM  Flax OIL Take by mouth.    [provider]  Ginger,  Zingiber officinalis, (GINGER PO) Take by mouth.    [provider]  ibuprofen (ADVIL,MOTRIN) 800 MG tablet Take 1 tablet (800 mg total) by mouth every 8 (eight) hours as needed. 11/14/17   Gilda Crease, MD  lisinopril (ZESTRIL) 10 MG tablet Take 1 tablet (10 mg total) by mouth daily. 09/16/20   Philip Aspen, Limmie Patricia, MD  metroNIDAZOLE (FLAGYL) 500 MG tablet Take 1 tablet (500 mg total) by mouth 3 (three) times daily. 11/14/17   Gilda Crease, MD  Multiple Vitamin (MULTIVITAMIN) tablet Take 1 tablet by mouth daily.    [provider]  NON FORMULARY     [provider]  NON FORMULARY Vine lozenges    [provider]  Omega-3 Fatty Acids (FISH OIL) 500 MG CAPS Take by mouth.    [provider]  pantoprazole  (PROTONIX) 40 MG tablet Take 1 tablet (40 mg total) by mouth 2 (two) times daily before a meal. 07/28/16   Adonis Brook, NP  vitamin B-12 (CYANOCOBALAMIN) 500 MCG tablet Take 500 mcg by mouth daily.    [provider]    Allergies    Other, Shellfish allergy, and Aminobenzoate  Review of Systems   Review of Systems  All other systems reviewed and are negative.  Physical Exam Updated Vital Signs BP (!) 194/97 (BP Location: Left Arm)   Pulse (!) 126   Temp (!) 100.8 F (38.2 C) (Oral)   Resp 20   SpO2 98%   Physical Exam Vitals and nursing note reviewed.  Constitutional:      General: She is not in acute distress.    Appearance: She is well-developed. She is obese.     Comments: Obese female, sitting in the bed appears to be in no acute discomfort, overall well appearing  HENT:     Head: Atraumatic.     Right Ear: Tympanic membrane normal.     Left Ear: Tympanic membrane normal.     Mouth/Throat:     Mouth: Mucous membranes are moist.     Pharynx: Uvula midline. Posterior oropharyngeal erythema (Mild posterior oropharyngeal erythema no tonsillar discharge no trismus.  Normal phonation) present. No oropharyngeal exudate.     Tonsils: No tonsillar exudate or tonsillar abscesses.  Eyes:     Conjunctiva/sclera: Conjunctivae normal.  Cardiovascular:     Rate and Rhythm: Tachycardia present.     Heart sounds: Normal heart sounds.  Pulmonary:     Effort: Pulmonary effort is normal.  Abdominal:     Palpations: Abdomen is soft.     Tenderness: There is no abdominal tenderness.  Musculoskeletal:     Cervical back: Neck supple.  Lymphadenopathy:     Cervical: Cervical adenopathy present.  Skin:    Findings: No rash.  Neurological:     Mental Status: She is alert.  Psychiatric:        Mood and Affect: Mood normal.    ED Results / Procedures / Treatments   Labs (all labs ordered are listed, but only abnormal results are displayed) Labs Reviewed  GROUP A STREP  BY PCR  RESP PANEL BY RT-PCR (FLU A&B, COVID) [id number]    EKG None  Radiology No results found.  Procedures Procedures   Medications Ordered in ED Medications  acetaminophen (TYLENOL) tablet 1,000 mg (1,000 mg Oral Given 03/12/21 0340)    ED Course  I have reviewed the triage vital signs and the nursing notes.  Pertinent labs & imaging results that were available during my  care of the patient were reviewed by me and considered in my medical decision making (see chart for details).    MDM Rules/Calculators/A&P                           BP (!) 156/83 (BP Location: Left Arm)   Pulse 94   Temp 98.6 F (37 C) (Oral)   Resp 18   SpO2 97%   Final Clinical Impression(s) / ED Diagnoses Final diagnoses:  Sore throat    Rx / DC Orders ED Discharge Orders          Ordered    lidocaine (XYLOCAINE) 2 % solution  As needed        03/12/21 0800    azithromycin (ZITHROMAX Z-PAK) 250 MG tablet        03/12/21 0800           7:32 AM Patient complaining of fever and sore throat.  She does have an elevated temperature of 100.8 and is tachycardic with a heart rate of 126.  She is also hypertensive with initial blood pressure of 194/97.  Patient however overall well-appearing, normal phonation, throat exam fairly unremarkable.  Mild posterior oropharyngeal erythema but no obvious abscess and I have low suspicion for deep tissue infection.  She does have some reactive lymph nodes to her anterior cervical chain but her trachea is midline.  Respiratory panel negative for COVID and flu, strep test negative.  Patient however reports concerns for bacterial infection.  Will provide symptomatic treatment however I will prescribe amoxicillin but recommend patient to only take antibiotic if her symptoms does not improve after 3 to 4 days.  Patient voiced understanding and agrees with plan.  Her elevated blood pressure is likely secondary to pain as well as the use of over-the-counter  medication.  She will need to have her blood pressure rechecked at her earliest convenience.   Fayrene Helper, PA-C 03/12/21 5701    Terald Sleeper, MD 03/12/21 [phone]

## 2021-03-12 NOTE — ED Provider Notes (Signed)
Emergency Medicine Provider Triage Evaluation Note  Margaret Christian , a 55 y.o. female  was evaluated in triage.  Pt complains of sore throat onset this morning.  States it feels like strep.  Denies sick contacts with similar.  Review of Systems  Positive: Sore throat Negative: fever  Physical Exam  BP (!) 194/97 (BP Location: Left Arm)   Pulse (!) 126   Temp (!) 100.8 F (38.2 C) (Oral)   Resp 20   SpO2 98%   Gen:   Awake, no distress   Resp:  Normal effort  MSK:   Moves extremities without difficulty  Other:  No oropharyngeal edema or exudates noted, mild erythema, handling secretions well, no stridor, phonation is normal  Medical Decision Making  Medically screening exam initiated at 3:26 AM.  Appropriate orders placed.  Florabelle Cardin was informed that the remainder of the evaluation will be completed by another provider, this initial triage assessment does not replace that evaluation, and the importance of remaining in the ED until their evaluation is complete.  Sore throat.  Febrile but well appearing.  Strep and covid screen sent, tylenol given for fever.   Garlon Hatchet, PA-C 03/12/21 0331    Rozelle Logan, DO 03/12/21 2340

## 2021-07-13 ENCOUNTER — Emergency Department (HOSPITAL_COMMUNITY): Payer: 59

## 2021-07-13 ENCOUNTER — Encounter (HOSPITAL_COMMUNITY): Payer: Self-pay

## 2021-07-13 ENCOUNTER — Emergency Department (HOSPITAL_COMMUNITY)
Admission: EM | Admit: 2021-07-13 | Discharge: 2021-07-13 | Disposition: A | Discharge status: Home / Self Care | Payer: 59 | Attending: Emergency Medicine | Admitting: Emergency Medicine

## 2021-07-13 DIAGNOSIS — S8001XA Contusion of right knee, initial encounter: Secondary | ICD-10-CM | POA: Diagnosis not present

## 2021-07-13 DIAGNOSIS — Y92410 Unspecified street and highway as the place of occurrence of the external cause: Secondary | ICD-10-CM | POA: Diagnosis not present

## 2021-07-13 DIAGNOSIS — M542 Cervicalgia: Secondary | ICD-10-CM | POA: Diagnosis not present

## 2021-07-13 DIAGNOSIS — Z20822 Contact with and (suspected) exposure to covid-19: Secondary | ICD-10-CM | POA: Insufficient documentation

## 2021-07-13 DIAGNOSIS — S8002XA Contusion of left knee, initial encounter: Secondary | ICD-10-CM | POA: Diagnosis not present

## 2021-07-13 DIAGNOSIS — T1490XA Injury, unspecified, initial encounter: Secondary | ICD-10-CM

## 2021-07-13 DIAGNOSIS — R0789 Other chest pain: Secondary | ICD-10-CM | POA: Diagnosis not present

## 2021-07-13 DIAGNOSIS — M545 Low back pain, unspecified: Secondary | ICD-10-CM | POA: Insufficient documentation

## 2021-07-13 DIAGNOSIS — R109 Unspecified abdominal pain: Secondary | ICD-10-CM | POA: Insufficient documentation

## 2021-07-13 DIAGNOSIS — S0990XA Unspecified injury of head, initial encounter: Secondary | ICD-10-CM | POA: Diagnosis present

## 2021-07-13 DIAGNOSIS — S060X0A Concussion without loss of consciousness, initial encounter: Secondary | ICD-10-CM | POA: Diagnosis not present

## 2021-07-13 LAB — COMPREHENSIVE METABOLIC PANEL
ALT: 22 U/L (ref 0–44)
AST: 19 U/L (ref 15–41)
Albumin: 4.7 g/dL (ref 3.5–5.0)
Alkaline Phosphatase: 63 U/L (ref 38–126)
Anion gap: 8 (ref 5–15)
BUN: 12 mg/dL (ref 6–20)
CO2: 26 mmol/L (ref 22–32)
Calcium: 9.2 mg/dL (ref 8.9–10.3)
Chloride: 104 mmol/L (ref 98–111)
Creatinine, Ser: 0.64 mg/dL (ref 0.44–1.00)
GFR, Estimated: >60 mL/min (ref >60)
Glucose, Bld: 108 mg/dL — ABNORMAL HIGH (ref 70–99)
Potassium: 3.8 mmol/L (ref 3.5–5.1)
Sodium: 138 mmol/L (ref 135–145)
Total Bilirubin: 0.4 mg/dL (ref 0.3–1.2)
Total Protein: 8.1 g/dL (ref 6.5–8.1)

## 2021-07-13 LAB — CBC
HCT: 45 % (ref 36.0–46.0)
Hemoglobin: 15 g/dL (ref 12.0–15.0)
MCH: 31.4 pg (ref 26.0–34.0)
MCHC: 33.3 g/dL (ref 30.0–36.0)
MCV: 94.1 fL (ref 80.0–100.0)
Platelets: 334 10*3/uL (ref 150–400)
RBC: 4.78 MIL/uL (ref 3.87–5.11)
RDW: 13.1 % (ref 11.5–15.5)
WBC: 8.4 10*3/uL (ref 4.0–10.5)
nRBC: 0 % (ref 0.0–0.2)

## 2021-07-13 LAB — I-STAT BETA HCG BLOOD, ED (MC, WL, AP ONLY): I-stat hCG, quantitative: 9.3 m[IU]/mL — ABNORMAL HIGH (ref <5)

## 2021-07-13 LAB — I-STAT CHEM 8, ED
BUN: 10 mg/dL (ref 6–20)
Calcium, Ion: 1.16 mmol/L (ref 1.15–1.40)
Chloride: 104 mmol/L (ref 98–111)
Creatinine, Ser: 0.6 mg/dL (ref 0.44–1.00)
Glucose, Bld: 106 mg/dL — ABNORMAL HIGH (ref 70–99)
HCT: 46 % (ref 36.0–46.0)
Hemoglobin: 15.6 g/dL — ABNORMAL HIGH (ref 12.0–15.0)
Potassium: 3.9 mmol/L (ref 3.5–5.1)
Sodium: 141 mmol/L (ref 135–145)
TCO2: 27 mmol/L (ref 22–32)

## 2021-07-13 LAB — RESP PANEL BY RT-PCR (FLU A&B, COVID) ARPGX2
Influenza A by PCR: NEGATIVE
Influenza B by PCR: NEGATIVE
SARS Coronavirus 2 by RT PCR: NEGATIVE

## 2021-07-13 LAB — HCG, SERUM, QUALITATIVE: Preg, Serum: NEGATIVE
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
## 2021-07-13 MED ORDER — IBUPROFEN 200 MG PO TABS
600.0000 mg | ORAL_TABLET | Freq: Once | ORAL | Status: AC
  Administered 2021-07-13: 600 mg via ORAL
  Filled 2021-07-13: qty 3

## 2021-07-13 MED ORDER — SODIUM CHLORIDE (PF) 0.9 % IJ SOLN
INTRAMUSCULAR | Status: AC
  Filled 2021-07-13: qty 50

## 2021-07-13 MED ORDER — IOHEXOL 350 MG/ML SOLN
100.0000 mL | Freq: Once | INTRAVENOUS | Status: AC | PRN
  Administered 2021-07-13: 100 mL via INTRAVENOUS
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
## 2021-07-29 ENCOUNTER — Telehealth: Payer: Self-pay

## 2021-07-29 NOTE — Telephone Encounter (Signed)
Please advise on approval ?

## 2021-07-29 NOTE — Telephone Encounter (Signed)
Please assist pt with our office scheduling if ok'd by PCP ?

## 2021-07-29 NOTE — Telephone Encounter (Signed)
Request transfer to another [place] PCP ? ?Pt is requesting to transfer FROM:  Dr. Ardyth Harps ?Pt is requesting to transfer TO: Dr. Felix Pacini ?Reason for requested transfer: Location - location of office ?Best contact number: [phone] ?

## 2021-07-29 NOTE — Telephone Encounter (Signed)
Okay with me, upon approval from her PCP. ?

## 2021-07-30 NOTE — Telephone Encounter (Signed)
Spoke to patient, scheduled TOC/NP visit on 6/19 with Dr. Claiborne Billings. ?

## 2021-08-05 ENCOUNTER — Emergency Department (HOSPITAL_COMMUNITY)
Admission: EM | Admit: 2021-08-05 | Discharge: 2021-08-05 | Disposition: A | Discharge status: Home / Self Care | Payer: 59 | Attending: Emergency Medicine | Admitting: Emergency Medicine

## 2021-08-05 ENCOUNTER — Emergency Department (HOSPITAL_COMMUNITY): Payer: 59

## 2021-08-05 ENCOUNTER — Other Ambulatory Visit: Payer: Self-pay

## 2021-08-05 ENCOUNTER — Encounter (HOSPITAL_COMMUNITY): Payer: Self-pay | Admitting: Emergency Medicine

## 2021-08-05 DIAGNOSIS — S060X0A Concussion without loss of consciousness, initial encounter: Secondary | ICD-10-CM | POA: Insufficient documentation

## 2021-08-05 DIAGNOSIS — S060XAA Concussion with loss of consciousness status unknown, initial encounter: Secondary | ICD-10-CM

## 2021-08-05 DIAGNOSIS — I10 Essential (primary) hypertension: Secondary | ICD-10-CM

## 2021-08-05 DIAGNOSIS — Z79899 Other long term (current) drug therapy: Secondary | ICD-10-CM | POA: Diagnosis not present

## 2021-08-05 DIAGNOSIS — Y92410 Unspecified street and highway as the place of occurrence of the external cause: Secondary | ICD-10-CM | POA: Diagnosis not present

## 2021-08-05 DIAGNOSIS — S0990XA Unspecified injury of head, initial encounter: Secondary | ICD-10-CM | POA: Diagnosis present

## 2021-08-05 DIAGNOSIS — R519 Headache, unspecified: Secondary | ICD-10-CM

## 2021-08-05 LAB — BASIC METABOLIC PANEL
Anion gap: 9 (ref 5–15)
BUN: 22 mg/dL — ABNORMAL HIGH (ref 6–20)
CO2: 24 mmol/L (ref 22–32)
Calcium: 9.3 mg/dL (ref 8.9–10.3)
Chloride: 106 mmol/L (ref 98–111)
Creatinine, Ser: 0.62 mg/dL (ref 0.44–1.00)
GFR, Estimated: >60 mL/min (ref >60)
Glucose, Bld: 105 mg/dL — ABNORMAL HIGH (ref 70–99)
Potassium: 4.2 mmol/L (ref 3.5–5.1)
Sodium: 139 mmol/L (ref 135–145)

## 2021-08-05 LAB — CBC WITH DIFFERENTIAL/PLATELET
Abs Immature Granulocytes: 0.03 10*3/uL (ref 0.00–0.07)
Basophils Absolute: 0.1 10*3/uL (ref 0.0–0.1)
Basophils Relative: 1 %
Eosinophils Absolute: 0.1 10*3/uL (ref 0.0–0.5)
Eosinophils Relative: 1 %
HCT: 43.2 % (ref 36.0–46.0)
Hemoglobin: 14.5 g/dL (ref 12.0–15.0)
Immature Granulocytes: 0 %
Lymphocytes Relative: 22 %
Lymphs Abs: 2.3 10*3/uL (ref 0.7–4.0)
MCH: 31.7 pg (ref 26.0–34.0)
MCHC: 33.6 g/dL (ref 30.0–36.0)
MCV: 94.3 fL (ref 80.0–100.0)
Monocytes Absolute: 0.8 10*3/uL (ref 0.1–1.0)
Monocytes Relative: 8 %
Neutro Abs: 7.1 10*3/uL (ref 1.7–7.7)
Neutrophils Relative %: 68 %
Platelets: 294 10*3/uL (ref 150–400)
RBC: 4.58 MIL/uL (ref 3.87–5.11)
RDW: 13.1 % (ref 11.5–15.5)
WBC: 10.5 10*3/uL (ref 4.0–10.5)
nRBC: 0 % (ref 0.0–0.2)

## 2021-08-05 IMAGING — CT CT HEAD W/O CM
3 series · 15 of 47 positions shown, 18 images · non-contrast
Comparison: [DATE]

CLINICAL DATA: Chronic headaches with increased frequency



[Series 2: head wo · axial · 0.47mm/px · z∈[-142,-12]mm · 9 of 32 slices shown, 12 images]
[im 3/32  brain]
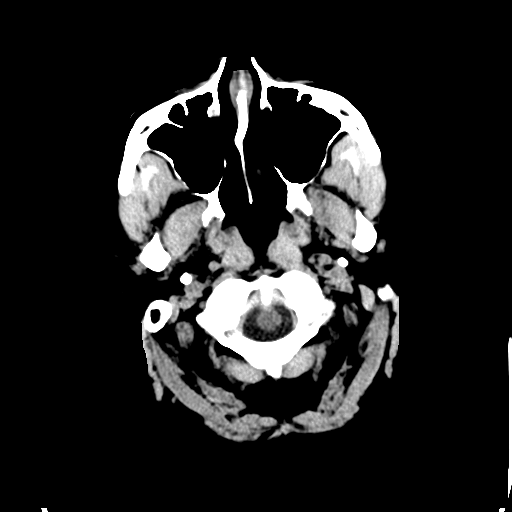
[im 3/32  bone]
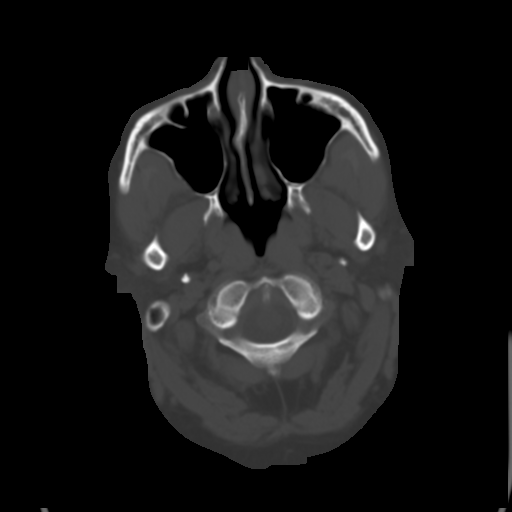
[im 6/32  brain]
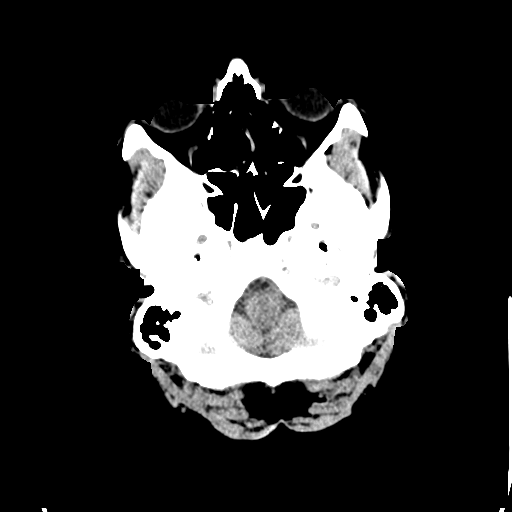
[im 9/32  brain]
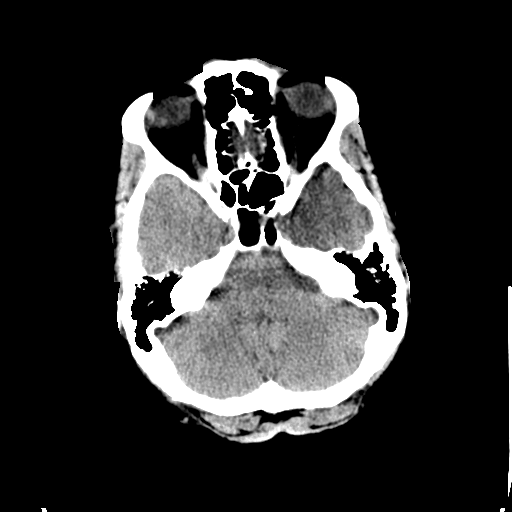
[im 12/32  brain]
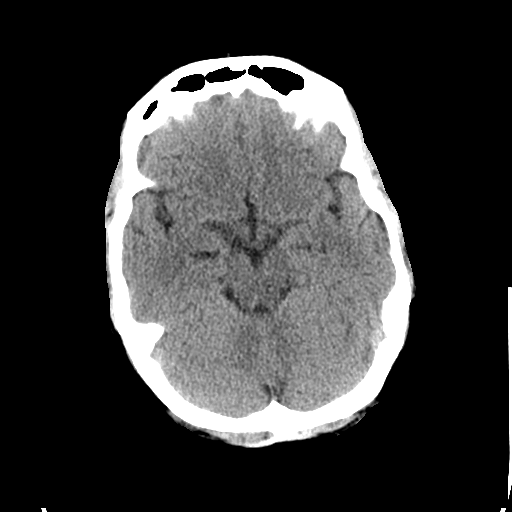
[im 17/32  brain]
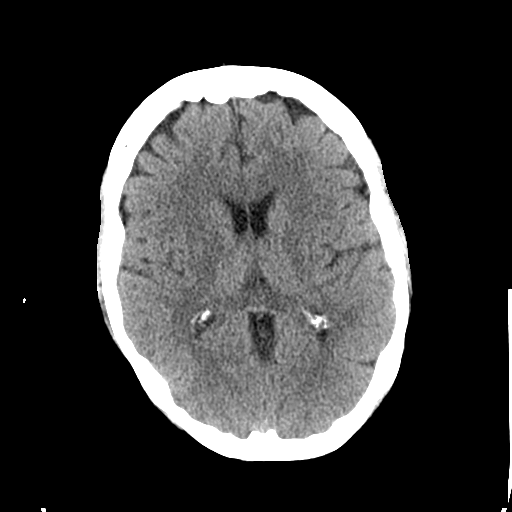
[im 17/32  bone]
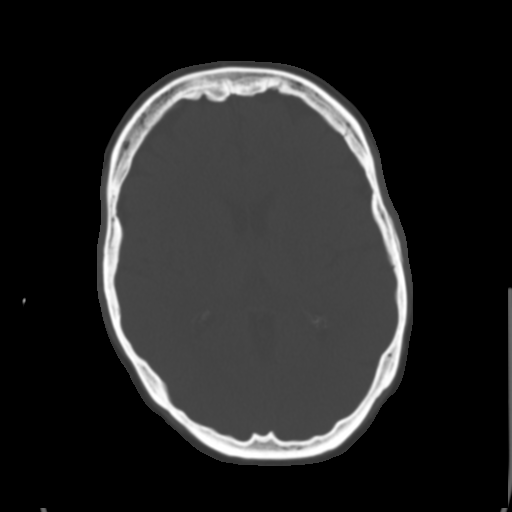
[im 20/32  brain]
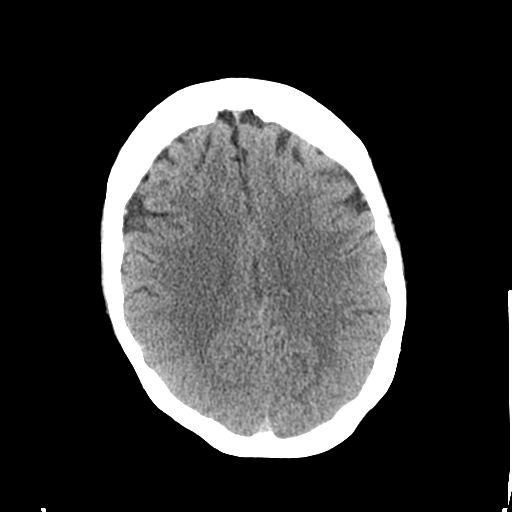
[im 23/32  brain]
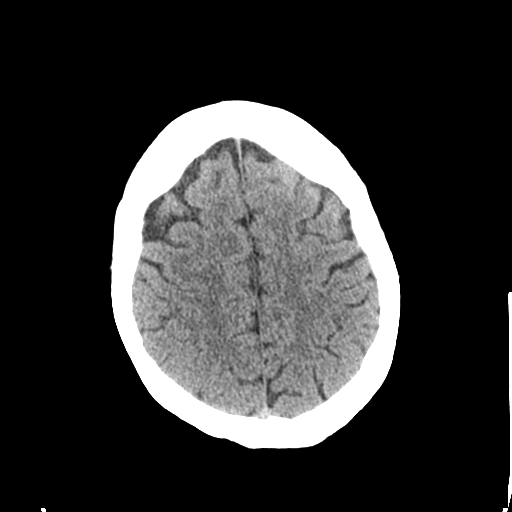
[im 26/32  brain]
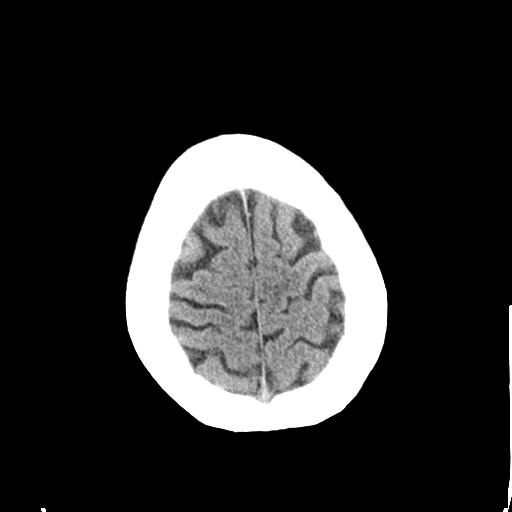
[im 29/32  brain]
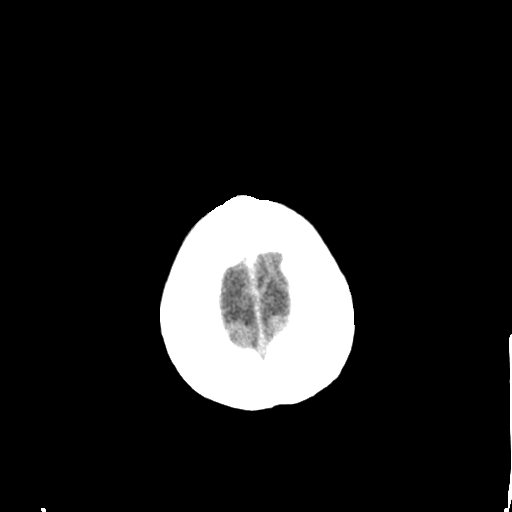
[im 29/32  bone]
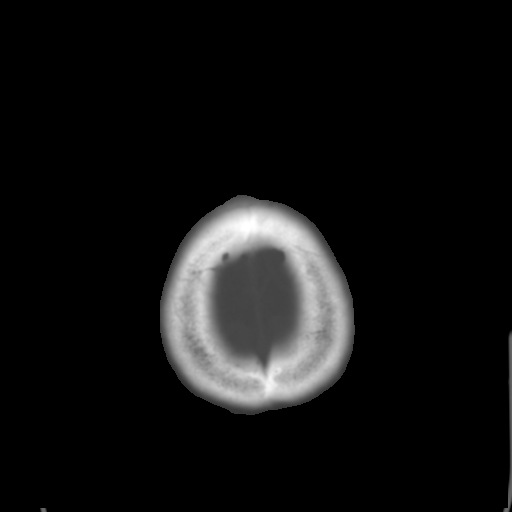

[Series 4: coronal soft tissue · coronal · 0.33mm/px · 3 of 66 slices shown]
[im 22/66  brain]
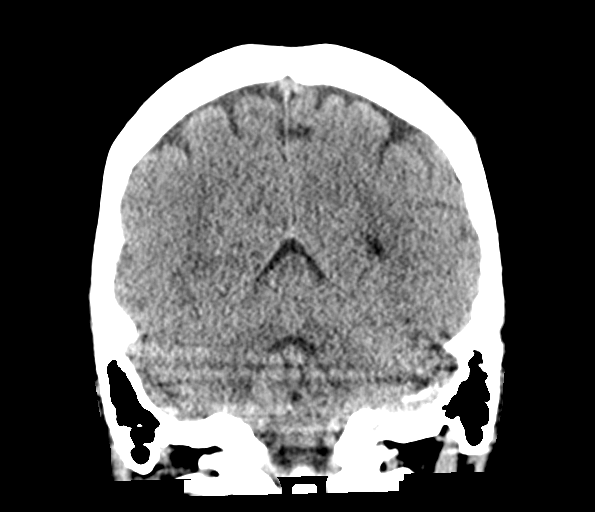
[im 29/66  brain]
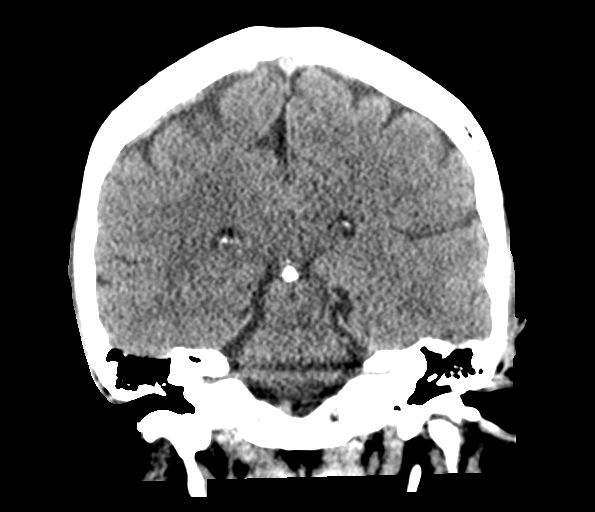
[im 37/66  brain]
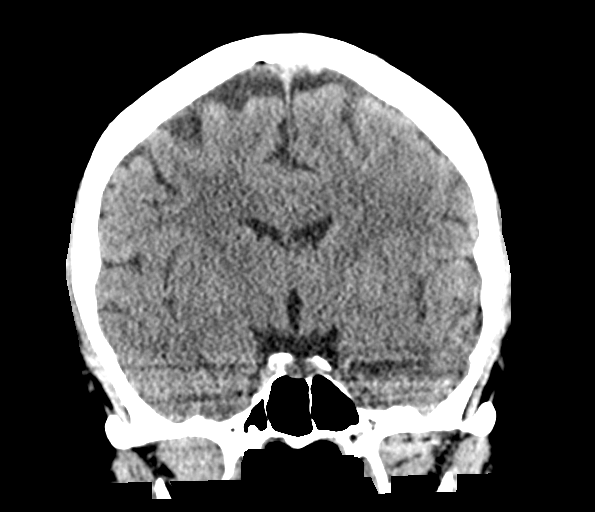

[Series 5: sagittal soft tissue · sagittal · 0.35mm/px · 3 of 53 slices shown]
[im 18/53  brain]
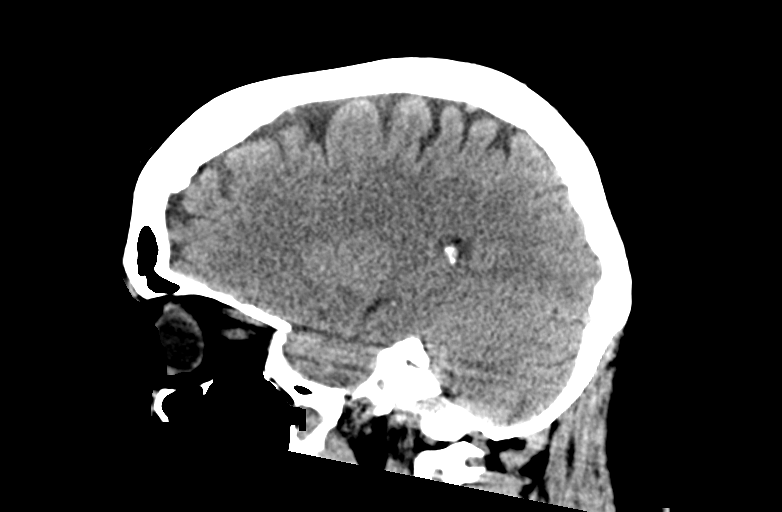
[im 27/53  brain]
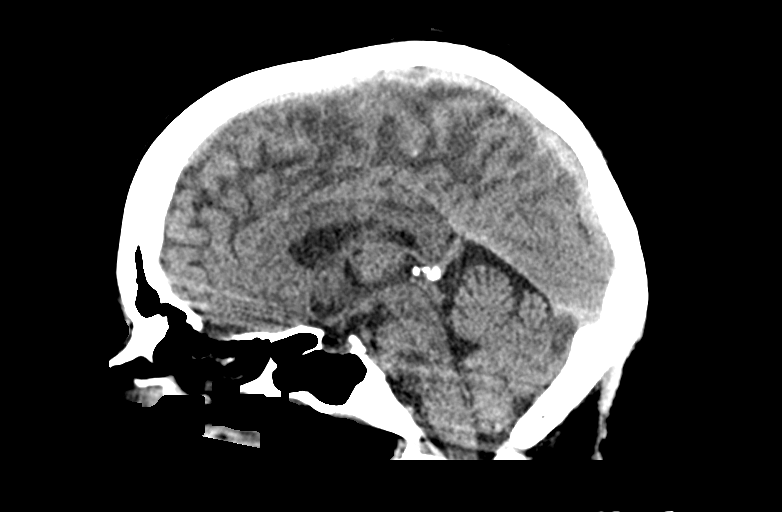
[im 35/53  brain]
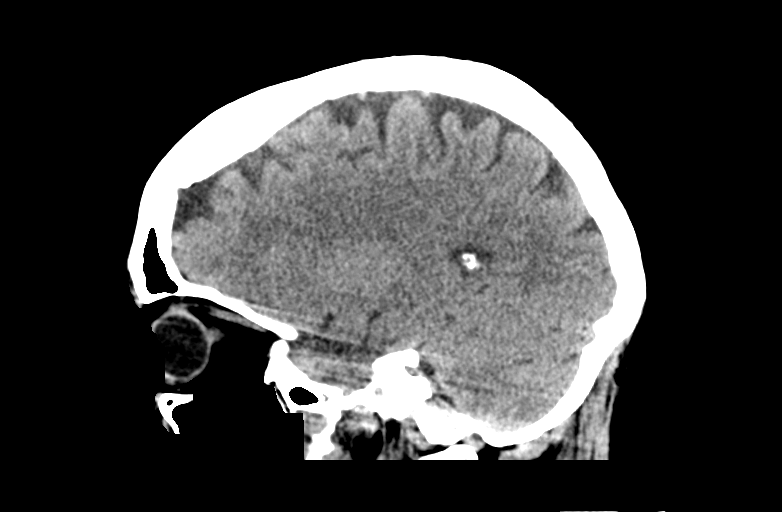

[15 of 47 positions shown; findings below may reference images not displayed]

FINDINGS: Brain: No acute intracranial findings are seen. There are no signs
of bleeding. There are no epidural or subdural fluid collections.
Ventricles are not dilated. Cortical sulci are prominent.

Vascular: Unremarkable.

Skull: Unremarkable.

Sinuses/Orbits: There are no air-fluid levels or significant mucosal
thickening. Mastoids are unremarkable.

Other: No significant interval changes are noted.
IMPRESSION: No acute intracranial findings are seen in noncontrast CT brain.

## 2021-08-05 MED ORDER — LISINOPRIL 5 MG PO TABS
5.0000 mg | ORAL_TABLET | Freq: Every day | ORAL | 0 refills | Status: AC
Start: 2021-08-05 — End: ?

## 2021-08-05 MED ORDER — LISINOPRIL 10 MG PO TABS
10.0000 mg | ORAL_TABLET | Freq: Once | ORAL | Status: AC
Start: 2021-08-05 — End: 2021-08-05
  Administered 2021-08-05: 10 mg via ORAL
  Filled 2021-08-05: qty 1

## 2021-08-05 MED ORDER — PROCHLORPERAZINE MALEATE 10 MG PO TABS: 10.0000 mg | ORAL_TABLET | Freq: Two times a day (BID) | ORAL | 0 refills | Status: DC | PRN

## 2021-08-05 MED ORDER — PROCHLORPERAZINE MALEATE 10 MG PO TABS
10.0000 mg | ORAL_TABLET | Freq: Two times a day (BID) | ORAL | 0 refills | Status: AC | PRN
Start: 2021-08-05 — End: ?

## 2021-08-05 MED ORDER — LORAZEPAM 2 MG/ML IJ SOLN
0.5000 mg | Freq: Once | INTRAMUSCULAR | Status: DC
  Filled 2021-08-05: qty 1

## 2021-08-05 MED ORDER — LISINOPRIL 5 MG PO TABS: 5.0000 mg | ORAL_TABLET | Freq: Every day | ORAL | 0 refills | Status: DC
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
## 2021-08-05 NOTE — ED Notes (Signed)
Patient stated she can not get the MRI due to her claustrophobia, she states she needs to be completely sedated for this procedure ?
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
## 2021-08-05 NOTE — ED Notes (Signed)
Patient refused Ativan and was transported to MRI ?
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
## 2021-08-11 ENCOUNTER — Ambulatory Visit: Payer: 59 | Admitting: Sports Medicine

## 2021-08-11 ENCOUNTER — Other Ambulatory Visit: Payer: Self-pay

## 2021-08-11 ENCOUNTER — Other Ambulatory Visit: Payer: Self-pay | Admitting: Sports Medicine

## 2021-08-11 VITALS — BP 122/82 | HR 98 | Ht 64.0 in | Wt 230.0 lb

## 2021-08-11 DIAGNOSIS — R270 Ataxia, unspecified: Secondary | ICD-10-CM

## 2021-08-11 DIAGNOSIS — S060X9A Concussion with loss of consciousness of unspecified duration, initial encounter: Secondary | ICD-10-CM

## 2021-08-11 DIAGNOSIS — G44319 Acute post-traumatic headache, not intractable: Secondary | ICD-10-CM

## 2021-08-11 MED ORDER — IBUPROFEN 800 MG PO TABS: 800.0000 mg | ORAL_TABLET | Freq: Three times a day (TID) | ORAL | 0 refills | Status: AC

## 2021-08-11 MED ORDER — MELOXICAM 15 MG PO TABS: 15.0000 mg | ORAL_TABLET | Freq: Every day | ORAL | 0 refills | Status: DC

## 2021-08-11 NOTE — Patient Instructions (Addendum)
Good to see you ?Complete mental and physical rest for 48 hours after concussive event ?Recommend light aerobic activity while keeping symptoms less than 3/10 ?Stop mental or physical activities that cause symptoms to worsen greater than 3/10, and wait 24 hours before attempting them again ?Eliminate screen time as much as possible for first 48 hours after concussive event, then continue limited screen time (recommend less than 2 hours per day) ?Work note provided  ?Ibuprofen 800 mg 3 times daily for pain relief  ?PT referral , vestibular  ?Neck HEP  ?1 week follow up  ?
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
## 2021-08-25 ENCOUNTER — Other Ambulatory Visit: Payer: Self-pay

## 2021-08-25 ENCOUNTER — Ambulatory Visit (INDEPENDENT_AMBULATORY_CARE_PROVIDER_SITE_OTHER): Payer: 59 | Admitting: Sports Medicine

## 2021-08-25 VITALS — BP 124/80 | HR 86 | Ht 64.0 in | Wt 231.0 lb

## 2021-08-25 DIAGNOSIS — G44319 Acute post-traumatic headache, not intractable: Secondary | ICD-10-CM

## 2021-08-25 DIAGNOSIS — S060X9A Concussion with loss of consciousness of unspecified duration, initial encounter: Secondary | ICD-10-CM | POA: Diagnosis not present

## 2021-08-25 DIAGNOSIS — R270 Ataxia, unspecified: Secondary | ICD-10-CM

## 2021-08-25 NOTE — Patient Instructions (Addendum)
Good to see you Work note provided  1 week follow up   
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
## 2021-09-08 ENCOUNTER — Other Ambulatory Visit: Payer: Self-pay | Admitting: Sports Medicine

## 2021-09-08 ENCOUNTER — Ambulatory Visit: Payer: 59 | Admitting: Sports Medicine

## 2021-09-08 VITALS — BP 122/80 | HR 92 | Ht 64.0 in | Wt 230.0 lb

## 2021-09-08 DIAGNOSIS — G44319 Acute post-traumatic headache, not intractable: Secondary | ICD-10-CM

## 2021-09-08 DIAGNOSIS — R112 Nausea with vomiting, unspecified: Secondary | ICD-10-CM

## 2021-09-08 DIAGNOSIS — S060X9A Concussion with loss of consciousness of unspecified duration, initial encounter: Secondary | ICD-10-CM

## 2021-09-08 DIAGNOSIS — G4700 Insomnia, unspecified: Secondary | ICD-10-CM | POA: Diagnosis not present

## 2021-09-08 DIAGNOSIS — R270 Ataxia, unspecified: Secondary | ICD-10-CM | POA: Diagnosis not present

## 2021-09-08 MED ORDER — METHOCARBAMOL 500 MG PO TABS: 500.0000 mg | ORAL_TABLET | Freq: Three times a day (TID) | ORAL | 0 refills | Status: AC

## 2021-09-08 MED ORDER — HYDROXYZINE HCL 10 MG PO TABS: 50.0000 mg | ORAL_TABLET | Freq: Every day | ORAL | 0 refills | Status: AC

## 2021-09-08 NOTE — Patient Instructions (Addendum)
Good to see you  ?MRI referral  ?Start Robaxin 500 mg 3 times a day ?Hydroxyzine 50 mg once daily  ?Start with robaxin 500 mg nightly as medication may cause drowsiness, can use up to 3 times a day  ?Recommend taking melatonin 5 mg nightly ?Follow up with [place] 3 days after MRI to discuss results ? ?

## 2021-09-22 ENCOUNTER — Telehealth: Payer: Self-pay | Admitting: Sports Medicine

## 2021-09-22 DIAGNOSIS — S060X9A Concussion with loss of consciousness of unspecified duration, initial encounter: Secondary | ICD-10-CM

## 2021-09-22 DIAGNOSIS — G44319 Acute post-traumatic headache, not intractable: Secondary | ICD-10-CM

## 2021-09-22 NOTE — Telephone Encounter (Signed)
Patient called regarding the MRI that was ordered. She said that she would like it ordered with dye. From research she has done, it shows that the ones with dye are much better quality. ? ?Please advise. ? ? ?(Patient would like a call back if this is done or what Dr Jean Rosenthal would like her to do. She is already scheduled for the MRI on May 4th.) ?

## 2021-09-22 NOTE — Telephone Encounter (Signed)
Tried to call patient and call could not be completed will try again later ?

## 2021-09-23 NOTE — Telephone Encounter (Signed)
Attempted to call patient this morning and recording says that the call cannot be completed at this time  ?

## 2021-09-25 ENCOUNTER — Other Ambulatory Visit: Payer: Self-pay | Admitting: Sports Medicine

## 2021-09-25 DIAGNOSIS — S060X9A Concussion with loss of consciousness of unspecified duration, initial encounter: Secondary | ICD-10-CM

## 2021-09-25 NOTE — Telephone Encounter (Signed)
Patient called back to follow up on previous message stating that no one called her back. ?I informed her that we were unable to reach her and she gave me a new number for the future. ? ?I gave her Dr Edison Pace response but was interrupted she stated that per the research she has done and what her lawyer told her, she would like the MRI ordered WITH contrast anyway. ? ?Can the order be changed please? ? ?I advised her that we would have to get approval for that as well. ? ?Patient can be reached at [phone]. ?(And phone number has been updated in the patient's chart.) ? ? ?

## 2021-09-25 NOTE — Telephone Encounter (Signed)
Pt was called and left a voicemail stating that a new referral had been sent in  ?

## 2021-10-01 ENCOUNTER — Other Ambulatory Visit: Payer: Self-pay | Admitting: Sports Medicine

## 2021-10-01 ENCOUNTER — Ambulatory Visit (HOSPITAL_COMMUNITY): Payer: 59

## 2021-10-01 NOTE — Addendum Note (Signed)
Addended by: Pincus Badder R on: 10/01/2021 07:59 AM ? ? Modules accepted: Orders ? ?

## 2021-10-16 ENCOUNTER — Ambulatory Visit (HOSPITAL_COMMUNITY): Payer: 59

## 2021-10-16 ENCOUNTER — Ambulatory Visit (HOSPITAL_COMMUNITY)
Admission: RE | Admit: 2021-10-16 | Discharge: 2021-10-16 | Disposition: A | Discharge status: Home / Self Care | Payer: 59 | Source: Ambulatory Visit | Attending: Sports Medicine | Admitting: Sports Medicine

## 2021-10-16 DIAGNOSIS — S060X9A Concussion with loss of consciousness of unspecified duration, initial encounter: Secondary | ICD-10-CM | POA: Diagnosis present

## 2021-10-16 DIAGNOSIS — G44319 Acute post-traumatic headache, not intractable: Secondary | ICD-10-CM | POA: Diagnosis present

## 2021-10-16 IMAGING — MR MR BRAIN/TEMPORAL BONE/IAC
13 series · 48 of 48 positions shown · IV contrast (gadavist)
Comparison: None Available.

CLINICAL DATA: Concussion symptoms. Concussion with loss of
consciousness, initial encounter.

EXAM:
MRI HEAD WITHOUT AND WITH CONTRAST
TECHNIQUE: Multiplanar, multiecho pulse sequences of the brain and surrounding
structures were obtained without and with intravenous contrast.
CONTRAST:  10mL GADAVIST GADOBUTROL 1 MMOL/ML IV SOLN

[Series 5: DWI · axial · 3.0mm · 1.36mm/px · z∈[-44,+103]mm · 8 of 100 slices shown (1 of 2)]
[im 1/100]
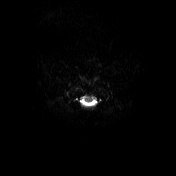
[im 15/100]
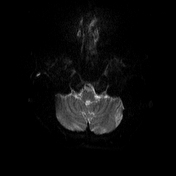
[im 29/100]
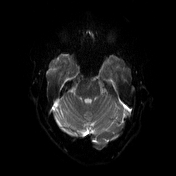
[im 43/100]
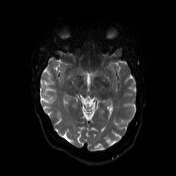
[im 57/100]
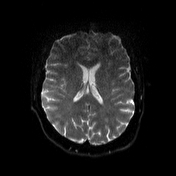
[im 71/100]
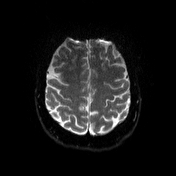
[im 85/100]
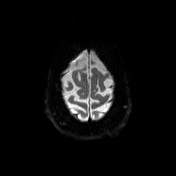
[im 100/100]
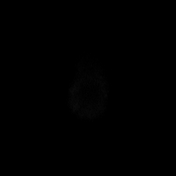

[Series 6: DWI · axial · 3.0mm · 1.36mm/px · z∈[-44,+103]mm · 4 of 50 slices shown (2 of 2)]
[im 1/50]
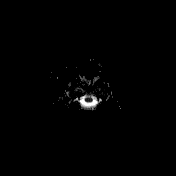
[im 17/50]
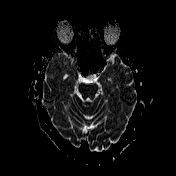
[im 33/50]
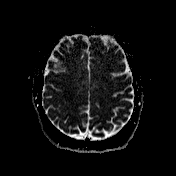
[im 50/50]
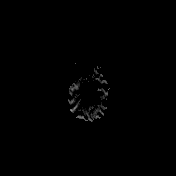

[Series 7: T1 · sagittal · 5.0mm · 0.75mm/px · 2 of 24 slices shown (1 of 3)]
[im 1/24]
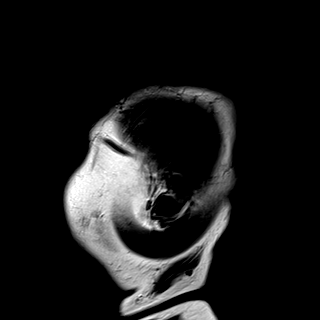
[im 24/24]
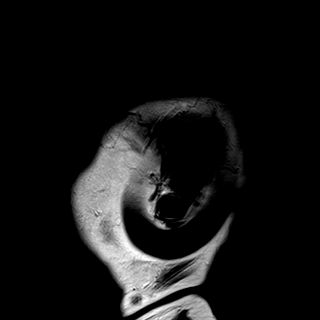

[Series 8: T2 · axial · 5.0mm · 0.57mm/px · z∈[-43,+101]mm · 2 of 25 slices shown]
[im 1/25]
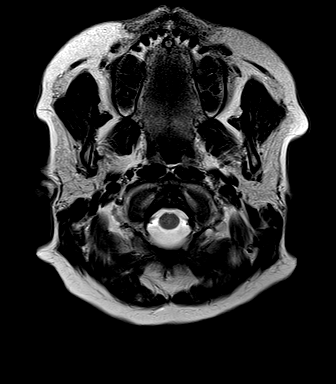
[im 25/25]
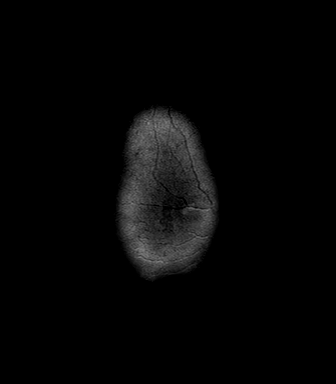

[Series 9: FLAIR · axial · 3.0mm · 0.69mm/px · z∈[-73,+131]mm · 3 of 35 slices shown (1 of 2)]
[im 1/35]
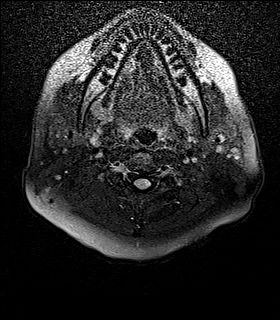
[im 18/35]
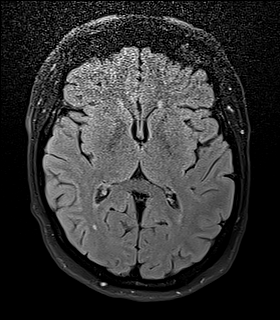
[im 35/35]
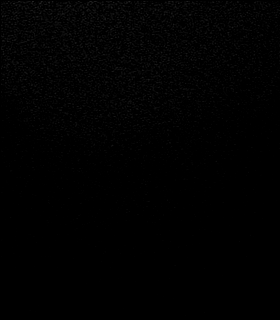

[Series 10: FLAIR · axial · 3.0mm · 0.69mm/px · z∈[-73,+131]mm · 3 of 35 slices shown (2 of 2)]
[im 1/35]
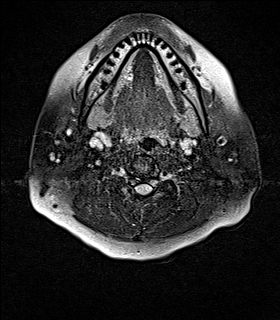
[im 18/35]
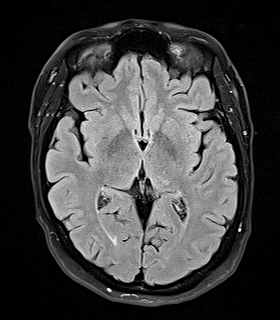
[im 35/35]
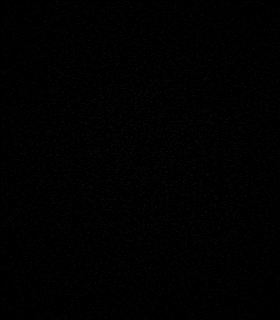

[Series 11: GRE · axial · 3.0mm · 0.45mm/px · z∈[-45,+104]mm · 4 of 51 slices shown]
[im 1/51]
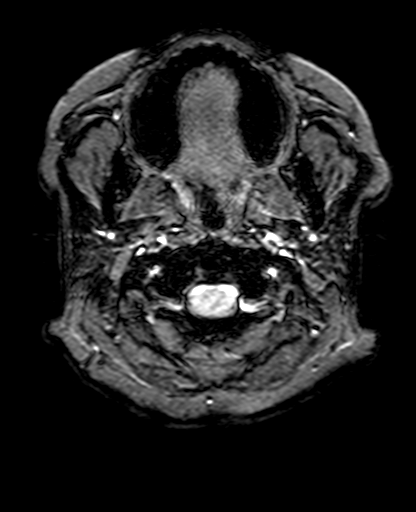
[im 17/51]
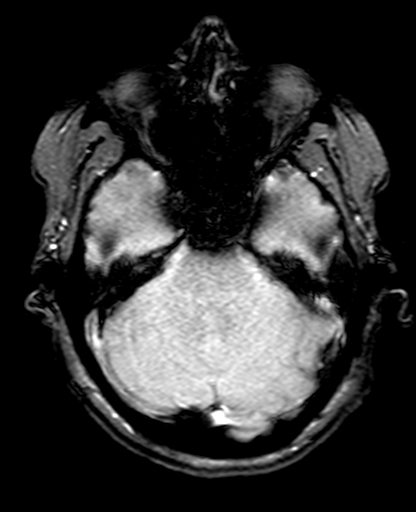
[im 34/51]
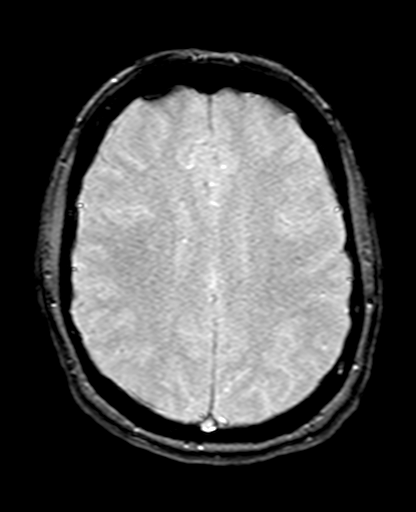
[im 51/51]
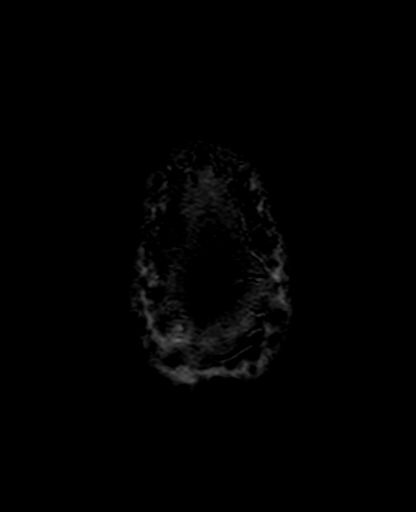

[Series 12: bSSFP · axial · 0.6mm · 0.56mm/px · z∈[-26,+0]mm · 4 of 44 slices shown]
[im 1/44]
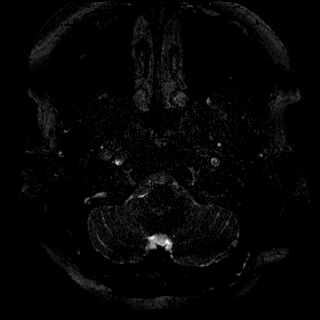
[im 15/44]
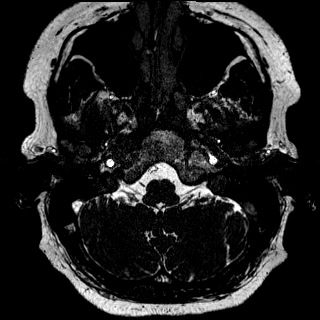
[im 29/44]
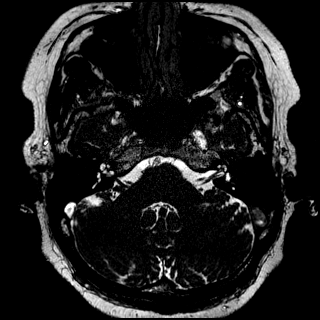
[im 44/44]
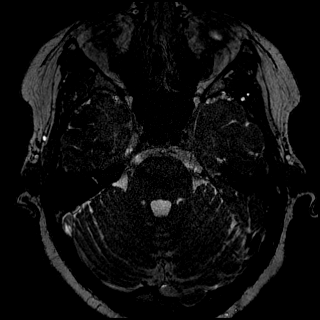

[Series 13: T1 · axial · 3.0mm · 0.31mm/px · 1 of 15 slices shown (2 of 3)]
[im 1/15]
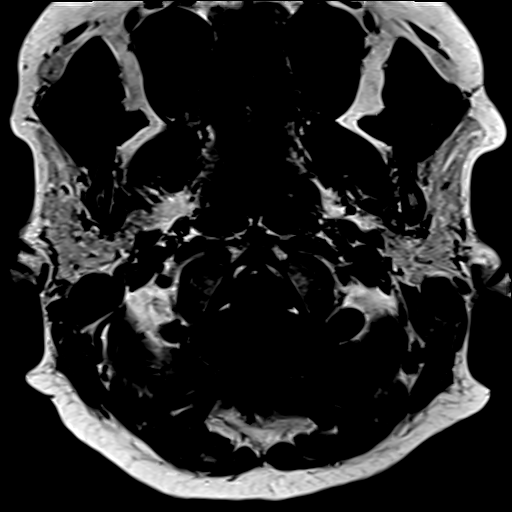

[Series 14: T1 · coronal · 3.0mm · 0.50mm/px · 1 of 15 slices shown (3 of 3)]
[im 1/15]
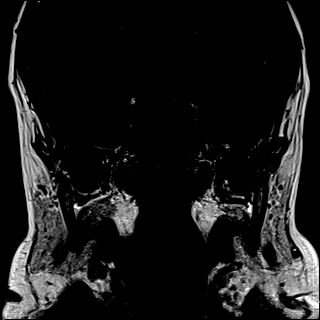

[Series 15: T1 post-contrast · coronal · 3.0mm · 0.50mm/px · 1 of 15 slices shown (1 of 3)]
[im 1/15]
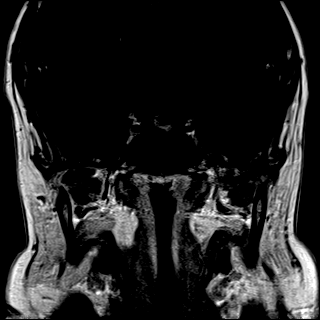

[Series 16: T1 post-contrast · axial · 3.0mm · 0.31mm/px · 1 of 15 slices shown (2 of 3)]
[im 1/15]
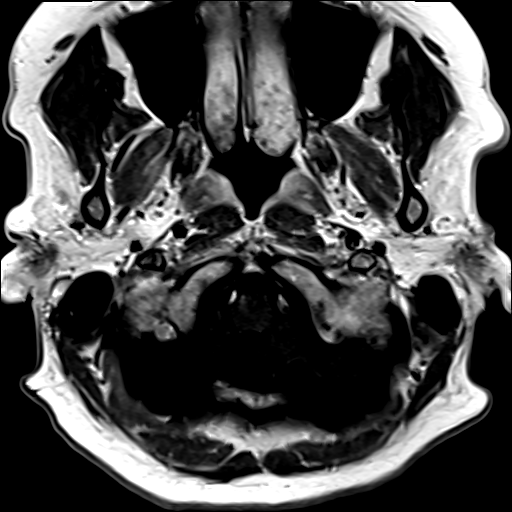

[Series 17: T1 post-contrast · axial · 1.0mm · 0.94mm/px · z∈[-58,+117]mm · 14 of 176 slices shown (3 of 3)]
[im 1/176]
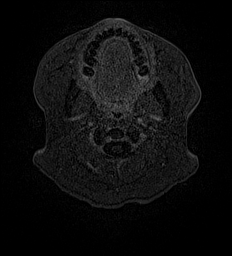
[im 14/176]
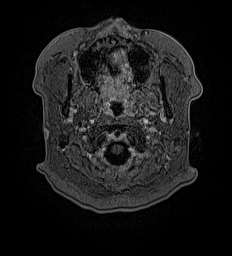
[im 27/176]
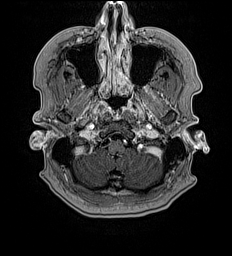
[im 41/176]
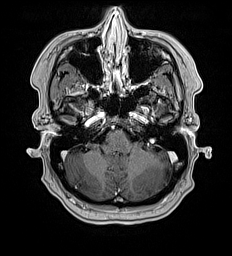
[im 54/176]
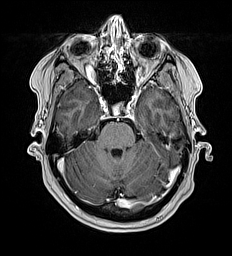
[im 68/176]
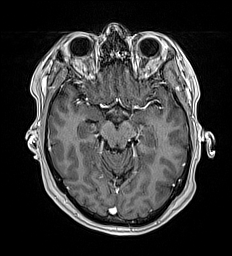
[im 81/176]
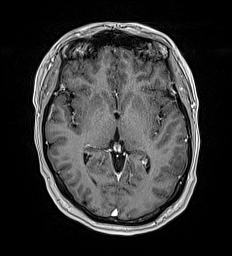
[im 95/176]
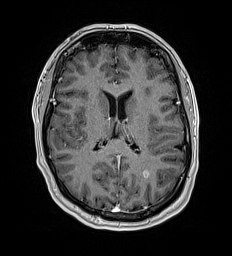
[im 108/176]
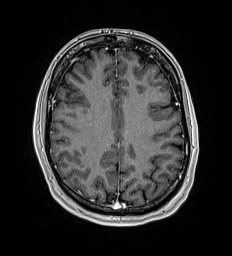
[im 122/176]
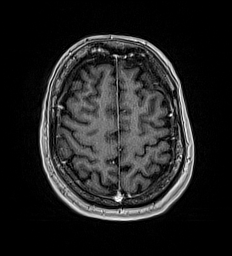
[im 135/176]
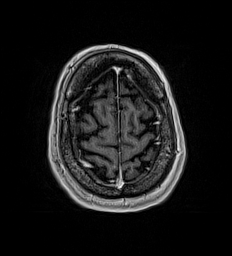
[im 149/176]
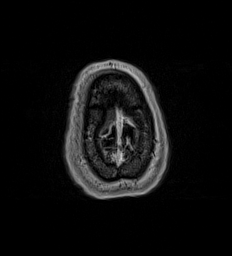
[im 162/176]
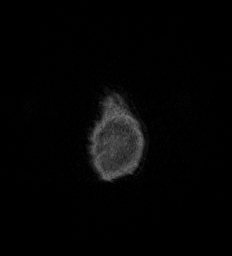
[im 176/176]
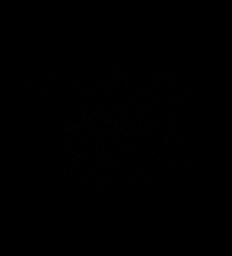

[48 of 48 positions shown; findings below may reference images not displayed]

FINDINGS: Brain: No acute infarct, mass effect or extra-axial collection. No
acute or chronic hemorrhage. There is a hyperintense T2-weighted
signal lesion within the left parietal white matter, near the atrium
of the left lateral ventricle, that shows mild peripheral contrast
enhancement. There are no other enhancing lesions. The midline
structures are normal. There is no cerebellopontine angle mass. The
cochleae and semicircular canals are normal. No focal abnormality
along the course of the 7th and 8th cranial nerves. Normal porus
acusticus and vestibular aqueduct bilaterally.

Vascular: Major flow voids are preserved.

Skull and upper cervical spine: Normal calvarium and skull base.
Visualized upper cervical spine and soft tissues are normal.

Sinuses/Orbits:No paranasal sinus fluid levels or advanced mucosal
thickening. No mastoid or middle ear effusion. Normal orbits.
IMPRESSION: 1. Peripherally enhancing focus within the left parietal white
matter is concerning for an active demyelinating lesion.
2. Normal MRI of the internal auditory canals and inner ear
structures.

## 2021-10-16 MED ORDER — GADOBUTROL 1 MMOL/ML IV SOLN
10.0000 mL | Freq: Once | INTRAVENOUS | Status: AC | PRN
  Administered 2021-10-16: 10 mL via INTRAVENOUS

## 2021-10-19 ENCOUNTER — Other Ambulatory Visit: Payer: Self-pay | Admitting: Sports Medicine

## 2021-10-19 DIAGNOSIS — S060X9A Concussion with loss of consciousness of unspecified duration, initial encounter: Secondary | ICD-10-CM

## 2021-10-19 DIAGNOSIS — G939 Disorder of brain, unspecified: Secondary | ICD-10-CM

## 2021-10-19 DIAGNOSIS — G44319 Acute post-traumatic headache, not intractable: Secondary | ICD-10-CM

## 2021-10-19 NOTE — Addendum Note (Signed)
Addended by: Jerene Canny R on: 10/19/2021 02:18 PM   Modules accepted: Orders

## 2021-10-23 ENCOUNTER — Telehealth: Payer: Self-pay | Admitting: Sports Medicine

## 2021-10-23 ENCOUNTER — Other Ambulatory Visit: Payer: Self-pay | Admitting: Sports Medicine

## 2021-10-23 NOTE — Telephone Encounter (Signed)
-----   Message from Richardean Sale, DO sent at 10/18/2021  9:07 AM EDT ----- Regarding: FW: Action items: patient needs to be referred to Neurology for diagnosis "demyelinating brain lesion" seen on MRI.   I recommend that patient establish care with Neurology for further evaluation and treatment. The active demyelinating lesion seen on MRI could be from many causes, but the most common is Multiple Sclerosis. Neurology is the most appropriate place to designate further treatment. ----- Message ----- From: Interface, Rad Results In Sent: 10/18/2021   3:18 AM EDT To: Richardean Sale, DO

## 2021-11-16 ENCOUNTER — Ambulatory Visit: Payer: 59 | Admitting: Family Medicine

## 2021-11-16 ENCOUNTER — Encounter: Payer: 59 | Admitting: Family Medicine

## 2021-12-14 ENCOUNTER — Telehealth: Payer: Self-pay | Admitting: Sports Medicine

## 2021-12-14 ENCOUNTER — Other Ambulatory Visit: Payer: Self-pay | Admitting: Sports Medicine

## 2021-12-14 DIAGNOSIS — G939 Disorder of brain, unspecified: Secondary | ICD-10-CM

## 2021-12-14 DIAGNOSIS — G44319 Acute post-traumatic headache, not intractable: Secondary | ICD-10-CM

## 2021-12-14 DIAGNOSIS — R270 Ataxia, unspecified: Secondary | ICD-10-CM

## 2021-12-14 DIAGNOSIS — S060X9A Concussion with loss of consciousness of unspecified duration, initial encounter: Secondary | ICD-10-CM

## 2021-12-14 NOTE — Telephone Encounter (Signed)
Patient called asking Dr Jean Rosenthal to send over a referral to Dr Thomasene Ripple at [facility]. Fax # [phone].  Patient would like a call back when this has been sent.

## 2021-12-14 NOTE — Telephone Encounter (Signed)
Pt was called and notified about the referral that was placed
